# Patient Record
Sex: Male | Born: 1975 | Race: White | Hispanic: No | Marital: Single | Smoking: Current every day smoker
Health system: Southern US, Community
[De-identification: ages and names within clinical notes are randomized; demographics above are authoritative.]

## PROBLEM LIST (undated history)

## (undated) DIAGNOSIS — I2510 Atherosclerotic heart disease of native coronary artery without angina pectoris: Secondary | ICD-10-CM

## (undated) DIAGNOSIS — J449 Chronic obstructive pulmonary disease, unspecified: Secondary | ICD-10-CM

## (undated) DIAGNOSIS — Z5189 Encounter for other specified aftercare: Secondary | ICD-10-CM

## (undated) DIAGNOSIS — I7121 Aneurysm of the ascending aorta, without rupture: Secondary | ICD-10-CM

## (undated) DIAGNOSIS — I639 Cerebral infarction, unspecified: Secondary | ICD-10-CM

## (undated) DIAGNOSIS — I219 Acute myocardial infarction, unspecified: Secondary | ICD-10-CM

## (undated) DIAGNOSIS — I10 Essential (primary) hypertension: Secondary | ICD-10-CM

## (undated) DIAGNOSIS — E785 Hyperlipidemia, unspecified: Secondary | ICD-10-CM

## (undated) HISTORY — DX: Atherosclerotic heart disease of native coronary artery without angina pectoris: I25.10

## (undated) HISTORY — DX: Chronic obstructive pulmonary disease, unspecified: J44.9

## (undated) HISTORY — DX: Aneurysm of the ascending aorta, without rupture: I71.21

## (undated) HISTORY — DX: Hyperlipidemia, unspecified: E78.5

---

## 1998-10-08 ENCOUNTER — Ambulatory Visit (HOSPITAL_COMMUNITY): Admission: RE | Admit: 1998-10-08 | Discharge: 1998-10-08 | Payer: Self-pay | Admitting: *Deleted

## 2001-09-19 ENCOUNTER — Ambulatory Visit (HOSPITAL_COMMUNITY): Admission: RE | Admit: 2001-09-19 | Discharge: 2001-09-19 | Payer: Self-pay | Admitting: Internal Medicine

## 2018-04-01 ENCOUNTER — Encounter (HOSPITAL_COMMUNITY): Payer: Self-pay | Admitting: Emergency Medicine

## 2018-04-01 ENCOUNTER — Other Ambulatory Visit: Payer: Self-pay

## 2018-04-01 ENCOUNTER — Emergency Department (HOSPITAL_COMMUNITY)
Admission: EM | Admit: 2018-04-01 | Discharge: 2018-04-01 | Disposition: A | Discharge status: Home / Self Care | Payer: Self-pay | Attending: Emergency Medicine | Admitting: Emergency Medicine

## 2018-04-01 ENCOUNTER — Emergency Department (HOSPITAL_COMMUNITY): Payer: Self-pay

## 2018-04-01 DIAGNOSIS — I10 Essential (primary) hypertension: Secondary | ICD-10-CM | POA: Insufficient documentation

## 2018-04-01 DIAGNOSIS — R0789 Other chest pain: Secondary | ICD-10-CM | POA: Insufficient documentation

## 2018-04-01 DIAGNOSIS — F17210 Nicotine dependence, cigarettes, uncomplicated: Secondary | ICD-10-CM | POA: Insufficient documentation

## 2018-04-01 HISTORY — DX: Essential (primary) hypertension: I10

## 2018-04-01 HISTORY — DX: Encounter for other specified aftercare: Z51.89

## 2018-04-01 LAB — TROPONIN I: Troponin I: <0.03 ng/mL (ref <0.03)

## 2018-04-01 LAB — COMPREHENSIVE METABOLIC PANEL
ALT: 25 U/L (ref 17–63)
AST: 28 U/L (ref 15–41)
Albumin: 4.5 g/dL (ref 3.5–5.0)
Alkaline Phosphatase: 53 U/L (ref 38–126)
Anion gap: 11 (ref 5–15)
BILIRUBIN TOTAL: 0.6 mg/dL (ref 0.3–1.2)
BUN: 16 mg/dL (ref 6–20)
CHLORIDE: 105 mmol/L (ref 101–111)
CO2: 25 mmol/L (ref 22–32)
Calcium: 9.3 mg/dL (ref 8.9–10.3)
Creatinine, Ser: 0.84 mg/dL (ref 0.61–1.24)
GFR calc Af Amer: >60 mL/min (ref >60)
GFR calc non Af Amer: >60 mL/min (ref >60)
GLUCOSE: 105 mg/dL — AB (ref 65–99)
POTASSIUM: 3.8 mmol/L (ref 3.5–5.1)
Sodium: 141 mmol/L (ref 135–145)
Total Protein: 7.8 g/dL (ref 6.5–8.1)

## 2018-04-01 LAB — CBC
HEMATOCRIT: 48.9 % (ref 39.0–52.0)
Hemoglobin: 15.7 g/dL (ref 13.0–17.0)
MCH: 31.2 pg (ref 26.0–34.0)
MCHC: 32.1 g/dL (ref 30.0–36.0)
MCV: 97.2 fL (ref 78.0–100.0)
Platelets: 179 10*3/uL (ref 150–400)
RBC: 5.03 MIL/uL (ref 4.22–5.81)
RDW: 12.8 % (ref 11.5–15.5)
WBC: 8.2 10*3/uL (ref 4.0–10.5)

## 2018-04-01 LAB — RAPID URINE DRUG SCREEN, HOSP PERFORMED
AMPHETAMINES: NOT DETECTED
BARBITURATES: NOT DETECTED
BENZODIAZEPINES: NOT DETECTED
Cocaine: NOT DETECTED
Opiates: NOT DETECTED
Tetrahydrocannabinol: POSITIVE — AB

## 2018-04-01 LAB — LIPASE, BLOOD: LIPASE: 32 U/L (ref 11–51)

## 2018-04-01 MED ORDER — AMLODIPINE BESYLATE 5 MG PO TABS
10.0000 mg | ORAL_TABLET | Freq: Once | ORAL | Status: AC
Start: 2018-04-01 — End: 2018-04-01
  Administered 2018-04-01: 10 mg via ORAL
  Filled 2018-04-01: qty 2

## 2018-04-01 MED ORDER — LABETALOL HCL 5 MG/ML IV SOLN
20.0000 mg | Freq: Once | INTRAVENOUS | Status: AC
  Administered 2018-04-01: 20 mg via INTRAVENOUS
  Filled 2018-04-01: qty 4

## 2018-04-01 MED ORDER — TRAMADOL HCL 50 MG PO TABS: 50.0000 mg | ORAL_TABLET | Freq: Four times a day (QID) | ORAL | 0 refills | Status: DC | PRN

## 2018-04-01 MED ORDER — ONDANSETRON HCL 4 MG/2ML IJ SOLN
4.0000 mg | Freq: Once | INTRAMUSCULAR | Status: AC
  Administered 2018-04-01: 4 mg via INTRAVENOUS
  Filled 2018-04-01: qty 2

## 2018-04-01 MED ORDER — MORPHINE SULFATE (PF) 4 MG/ML IV SOLN
4.0000 mg | Freq: Once | INTRAVENOUS | Status: AC
  Administered 2018-04-01: 4 mg via INTRAVENOUS
  Filled 2018-04-01: qty 1

## 2018-04-01 MED ORDER — AMLODIPINE BESYLATE 10 MG PO TABS: 10.0000 mg | ORAL_TABLET | Freq: Every day | ORAL | 0 refills | Status: DC

## 2018-04-01 MED ORDER — ACETAMINOPHEN 500 MG PO TABS
1000.0000 mg | ORAL_TABLET | Freq: Once | ORAL | Status: AC
  Administered 2018-04-01: 1000 mg via ORAL
  Filled 2018-04-01: qty 2

## 2018-04-01 MED ORDER — SODIUM CHLORIDE 0.9 % IV SOLN
INTRAVENOUS | Status: DC
  Administered 2018-04-01: 10:00:00 via INTRAVENOUS

## 2018-04-01 NOTE — ED Provider Notes (Signed)
[facility] EMERGENCY DEPARTMENT Provider Note   CSN: [id number] Arrival date & time: 04/01/18  4098     History   Chief Complaint Chief Complaint  Patient presents with  . Chest Pain    HPI Caleb Perkins is a 42 y.o. male.  Patient c/o sharp pain to right lower chest area for the past 2 days. Symptoms sharp, non radiating, lasts seconds per episode per recur frequently with cough, change of position, or certain movements. Denies specific chest wall injury or strain, but states work does involve bending and lifting. Denies pleuritic pain. No sob. No nv or diaphoresis. Denies cough or sore throat. No fever or chills. No leg pain or swelling. Hx htn, but off meds x many months. No headache.   The history is provided by the patient.  Chest Pain   Pertinent negatives include no abdominal pain, no back pain, no cough, no fever, no headaches, no palpitations, no shortness of breath and no vomiting.    Past Medical History:  Diagnosis Date  . Blood transfusion without reported diagnosis   . Hypertension     There are no active problems to display for this patient.   History reviewed. No pertinent surgical history.      Home Medications    Prior to Admission medications   Not on File    Family History History reviewed. No pertinent family history.  Social History Social History   Tobacco Use  . Smoking status: Current Every Day Smoker    Packs/day: 1.00    Types: Cigarettes  Substance Use Topics  . Alcohol use: Not Currently  . Drug use: Yes    Types: Marijuana     Allergies   Patient has no known allergies.   Review of Systems Review of Systems  Constitutional: Negative for fever.  HENT: Negative for sore throat.   Eyes: Negative for visual disturbance.  Respiratory: Negative for cough and shortness of breath.   Cardiovascular: Positive for chest pain. Negative for palpitations and leg swelling.  Gastrointestinal: Negative for abdominal pain and  vomiting.  Genitourinary: Negative for flank pain.  Musculoskeletal: Negative for back pain and neck pain.  Skin: Negative for rash.  Neurological: Negative for headaches.  Hematological: Does not bruise/bleed easily.  Psychiatric/Behavioral: Negative for confusion.     Physical Exam Updated Vital Signs BP (!) 198/128   Pulse 73   Temp (!) 97.5 F (36.4 C) (Oral)   Resp 14   Ht 1.778 m (5\' 10" )   Wt 81.6 kg (180 lb)   SpO2 100%   BMI 25.83 kg/m   Physical Exam  Constitutional: He appears well-developed and well-nourished. No distress.  HENT:  Mouth/Throat: Oropharynx is clear and moist.  Eyes: Conjunctivae are normal.  Neck: Neck supple. No tracheal deviation present.  Cardiovascular: Normal rate, regular rhythm, normal heart sounds and intact distal pulses. Exam reveals no gallop and no friction rub.  No murmur heard. Pulmonary/Chest: Effort normal and breath sounds normal. No accessory muscle usage. No respiratory distress. He exhibits tenderness.  Right chest wall tenderness, reproducing pts symptoms, and worse w movement on stretcher, and palpation chest wall.   Abdominal: Soft. Bowel sounds are normal. He exhibits no distension and no mass. There is no tenderness. There is no guarding.  Genitourinary:  Genitourinary Comments: No cva tenderness  Musculoskeletal: He exhibits no edema.  Neurological: He is alert.  Skin: Skin is warm and dry. No rash noted. He is not diaphoretic.  Psychiatric: He has a  normal mood and affect.  Nursing note and vitals reviewed.    ED Treatments / Results  Labs (all labs ordered are listed, but only abnormal results are displayed) Results for orders placed or performed during the hospital encounter of 04/01/18  CBC  Result Value Ref Range   WBC 8.2 4.0 - 10.5 K/uL   RBC 5.03 4.22 - 5.81 MIL/uL   Hemoglobin 15.7 13.0 - 17.0 g/dL   HCT 16.148.9 09.639.0 - 04.552.0 %   MCV 97.2 78.0 - 100.0 fL   MCH 31.2 26.0 - 34.0 pg   MCHC 32.1 30.0 - 36.0  g/dL   RDW 40.912.8 81.111.5 - 91.415.5 %   Platelets 179 150 - 400 K/uL  Comprehensive metabolic panel  Result Value Ref Range   Sodium 141 135 - 145 mmol/L   Potassium 3.8 3.5 - 5.1 mmol/L   Chloride 105 101 - 111 mmol/L   CO2 25 22 - 32 mmol/L   Glucose, Bld 105 (H) 65 - 99 mg/dL   BUN 16 6 - 20 mg/dL   Creatinine, Ser 7.820.84 0.61 - 1.24 mg/dL   Calcium 9.3 8.9 - 95.610.3 mg/dL   Total Protein 7.8 6.5 - 8.1 g/dL   Albumin 4.5 3.5 - 5.0 g/dL   AST 28 15 - 41 U/L   ALT 25 17 - 63 U/L   Alkaline Phosphatase 53 38 - 126 U/L   Total Bilirubin 0.6 0.3 - 1.2 mg/dL   GFR calc non Af Amer >60 >60 mL/min   GFR calc Af Amer >60 >60 mL/min   Anion gap 11 5 - 15  Troponin I  Result Value Ref Range   Troponin I <0.03 <0.03 ng/mL  Rapid urine drug screen (hospital performed)  Result Value Ref Range   Opiates NONE DETECTED NONE DETECTED   Cocaine NONE DETECTED NONE DETECTED   Benzodiazepines NONE DETECTED NONE DETECTED   Amphetamines NONE DETECTED NONE DETECTED   Tetrahydrocannabinol POSITIVE (A) NONE DETECTED   Barbiturates NONE DETECTED NONE DETECTED  Lipase, blood  Result Value Ref Range   Lipase 32 11 - 51 U/L   Dg Chest Port 1 View  Result Date: 04/01/2018 CLINICAL DATA:  Chest pain worsening for 2 days. EXAM: PORTABLE CHEST 1 VIEW COMPARISON:  None. FINDINGS: The heart is enlarged. There is mild vascular congestion without frank edema or consolidation. No effusion or pneumothorax. IMPRESSION: Cardiomegaly with mild vascular congestion. Electronically Signed   By: Elsie StainJohn T Curnes M.D.   On: 04/01/2018 09:28    EKG EKG Interpretation  Date/Time:  Tuesday April 01 2018 08:52:23 EDT Ventricular Rate:  85 PR Interval:    QRS Duration: 94 QT Interval:  375 QTC Calculation: 446 R Axis:   71 Text Interpretation:  Sinus rhythm LVH with secondary repolarization abnormality Non-specific ST-t changes No previous tracing Confirmed by Cathren LaineSteinl, Jatziri Goffredo ([id number]) on 04/01/2018 9:07:57 AM   Radiology Dg Chest  Port 1 View  Result Date: 04/01/2018 CLINICAL DATA:  Chest pain worsening for 2 days. EXAM: PORTABLE CHEST 1 VIEW COMPARISON:  None. FINDINGS: The heart is enlarged. There is mild vascular congestion without frank edema or consolidation. No effusion or pneumothorax. IMPRESSION: Cardiomegaly with mild vascular congestion. Electronically Signed   By: Elsie StainJohn T Curnes M.D.   On: 04/01/2018 09:28    Procedures Procedures (including critical care time)  Medications Ordered in ED Medications  0.9 %  sodium chloride infusion ( Intravenous New Bag/Given 04/01/18 0931)  morphine 4 MG/ML injection 4 mg (4 mg Intravenous Given  04/01/18 0931)  ondansetron (ZOFRAN) injection 4 mg (4 mg Intravenous Given 04/01/18 0931)     Initial Impression / Assessment and Plan / ED Course  I have reviewed the triage vital signs and the nursing notes.  Pertinent labs & imaging results that were available during my care of the patient were reviewed by me and considered in my medical decision making (see chart for details).  Iv ns. Labs. Ecg. Cxr.  Morphine 4 mg iv for pain.   Recheck bp still high. Hx same.   Labetalol iv.   Recheck, pt comfortable/resting comfortably.  No increased wob or sob. Pt with reproducible right lower rib/chest wall tenderness. Normal chest wall movement.  Repeat abd exam - abd soft no tenderness.   Pt afebrile. bp improved from prior. Hr 68, rr 14. Pulse ox 99%.   Patient currently appears stable for d/c.      Final Clinical Impressions(s) / ED Diagnoses   Final diagnoses:  None    ED Discharge Orders    None       Cathren Laine, MD 04/01/18 1239

## 2018-04-01 NOTE — ED Triage Notes (Signed)
Pt reports right rib pain x 2 days.  Hurts worse to palpate, move, or cough.  States he has been coughing up white stuff x 2 days.

## 2018-04-01 NOTE — ED Notes (Signed)
Dr Denton Lanksteinl aware of patient

## 2018-04-01 NOTE — Discharge Instructions (Addendum)
It was our pleasure to provide your ER care today - we hope that you feel better.  Your blood pressure is high today. Take blood pressure medication (amlodipine) as prescribed, limit salt intake, and follow up with primary care doctor in the coming week.   Take motrin or aleve as need for pain. You may also take ultram as need for pain - no driving for the next 4 hours or when taking ultram.   Return to ER if worse, new symptoms, fevers, worsening/severe pain, trouble breathing, new or severe abdominal pain, other concern.

## 2018-04-01 NOTE — ED Notes (Signed)
Patient has been discharged at this time. Family with patient. Pt verbalizes readiness for discharge.

## 2019-11-09 ENCOUNTER — Inpatient Hospital Stay (HOSPITAL_COMMUNITY)
Admission: EM | Admit: 2019-11-09 | Discharge: 2019-11-11 | DRG: 192 | Disposition: A | Discharge status: Home / Self Care | Payer: Self-pay | Attending: Internal Medicine | Admitting: Internal Medicine

## 2019-11-09 ENCOUNTER — Encounter (HOSPITAL_COMMUNITY): Payer: Self-pay | Admitting: Emergency Medicine

## 2019-11-09 ENCOUNTER — Other Ambulatory Visit: Payer: Self-pay

## 2019-11-09 ENCOUNTER — Emergency Department (HOSPITAL_COMMUNITY): Payer: Self-pay

## 2019-11-09 DIAGNOSIS — I10 Essential (primary) hypertension: Secondary | ICD-10-CM | POA: Diagnosis present

## 2019-11-09 DIAGNOSIS — R05 Cough: Secondary | ICD-10-CM

## 2019-11-09 DIAGNOSIS — Z720 Tobacco use: Secondary | ICD-10-CM | POA: Diagnosis present

## 2019-11-09 DIAGNOSIS — F17210 Nicotine dependence, cigarettes, uncomplicated: Secondary | ICD-10-CM | POA: Diagnosis present

## 2019-11-09 DIAGNOSIS — J471 Bronchiectasis with (acute) exacerbation: Principal | ICD-10-CM | POA: Diagnosis present

## 2019-11-09 DIAGNOSIS — J441 Chronic obstructive pulmonary disease with (acute) exacerbation: Secondary | ICD-10-CM

## 2019-11-09 DIAGNOSIS — Z20828 Contact with and (suspected) exposure to other viral communicable diseases: Secondary | ICD-10-CM | POA: Diagnosis present

## 2019-11-09 DIAGNOSIS — J209 Acute bronchitis, unspecified: Secondary | ICD-10-CM | POA: Diagnosis present

## 2019-11-09 DIAGNOSIS — F1290 Cannabis use, unspecified, uncomplicated: Secondary | ICD-10-CM | POA: Diagnosis present

## 2019-11-09 DIAGNOSIS — J470 Bronchiectasis with acute lower respiratory infection: Secondary | ICD-10-CM | POA: Diagnosis present

## 2019-11-09 DIAGNOSIS — E876 Hypokalemia: Secondary | ICD-10-CM | POA: Diagnosis present

## 2019-11-09 DIAGNOSIS — R059 Cough, unspecified: Secondary | ICD-10-CM

## 2019-11-09 LAB — COMPREHENSIVE METABOLIC PANEL
ALT: 17 U/L (ref 0–44)
AST: 21 U/L (ref 15–41)
Albumin: 4.4 g/dL (ref 3.5–5.0)
Alkaline Phosphatase: 76 U/L (ref 38–126)
Anion gap: 10 (ref 5–15)
BUN: 15 mg/dL (ref 6–20)
CO2: 23 mmol/L (ref 22–32)
Calcium: 9 mg/dL (ref 8.9–10.3)
Chloride: 104 mmol/L (ref 98–111)
Creatinine, Ser: 0.89 mg/dL (ref 0.61–1.24)
GFR calc Af Amer: >60 mL/min (ref >60)
GFR calc non Af Amer: >60 mL/min (ref >60)
Glucose, Bld: 107 mg/dL — ABNORMAL HIGH (ref 70–99)
Potassium: 3.1 mmol/L — ABNORMAL LOW (ref 3.5–5.1)
Sodium: 137 mmol/L (ref 135–145)
Total Bilirubin: 0.8 mg/dL (ref 0.3–1.2)
Total Protein: 7.7 g/dL (ref 6.5–8.1)

## 2019-11-09 LAB — CBC WITH DIFFERENTIAL/PLATELET
Abs Immature Granulocytes: 0.02 10*3/uL (ref 0.00–0.07)
Basophils Absolute: 0.1 10*3/uL (ref 0.0–0.1)
Basophils Relative: 1 %
Eosinophils Absolute: 0.1 10*3/uL (ref 0.0–0.5)
Eosinophils Relative: 1 %
HCT: 48.3 % (ref 39.0–52.0)
Hemoglobin: 16.1 g/dL (ref 13.0–17.0)
Immature Granulocytes: 0 %
Lymphocytes Relative: 20 %
Lymphs Abs: 1.4 10*3/uL (ref 0.7–4.0)
MCH: 32.5 pg (ref 26.0–34.0)
MCHC: 33.3 g/dL (ref 30.0–36.0)
MCV: 97.6 fL (ref 80.0–100.0)
Monocytes Absolute: 0.5 10*3/uL (ref 0.1–1.0)
Monocytes Relative: 8 %
Neutro Abs: 4.8 10*3/uL (ref 1.7–7.7)
Neutrophils Relative %: 70 %
Platelets: 165 10*3/uL (ref 150–400)
RBC: 4.95 MIL/uL (ref 4.22–5.81)
RDW: 12.6 % (ref 11.5–15.5)
WBC: 6.8 10*3/uL (ref 4.0–10.5)
nRBC: 0 % (ref 0.0–0.2)

## 2019-11-09 LAB — RAPID URINE DRUG SCREEN, HOSP PERFORMED
Amphetamines: NOT DETECTED
Barbiturates: NOT DETECTED
Benzodiazepines: NOT DETECTED
Cocaine: NOT DETECTED
Opiates: NOT DETECTED
Tetrahydrocannabinol: POSITIVE — AB

## 2019-11-09 LAB — MAGNESIUM: Magnesium: 2.2 mg/dL (ref 1.7–2.4)

## 2019-11-09 MED ORDER — ALBUTEROL SULFATE HFA 108 (90 BASE) MCG/ACT IN AERS
6.0000 | INHALATION_SPRAY | Freq: Four times a day (QID) | RESPIRATORY_TRACT | Status: DC
  Administered 2019-11-10 (×3): 6 via RESPIRATORY_TRACT
  Filled 2019-11-09: qty 6.7

## 2019-11-09 MED ORDER — ALBUTEROL SULFATE HFA 108 (90 BASE) MCG/ACT IN AERS
4.0000 | INHALATION_SPRAY | Freq: Once | RESPIRATORY_TRACT | Status: AC
  Administered 2019-11-09: 13:00:00 4 via RESPIRATORY_TRACT
  Filled 2019-11-09: qty 6.7

## 2019-11-09 MED ORDER — GUAIFENESIN-DM 100-10 MG/5ML PO SYRP
10.0000 mL | ORAL_SOLUTION | Freq: Three times a day (TID) | ORAL | Status: AC
  Administered 2019-11-09 – 2019-11-11 (×6): 10 mL via ORAL
  Filled 2019-11-09 (×6): qty 10

## 2019-11-09 MED ORDER — ALBUTEROL SULFATE HFA 108 (90 BASE) MCG/ACT IN AERS
6.0000 | INHALATION_SPRAY | Freq: Once | RESPIRATORY_TRACT | Status: AC
  Administered 2019-11-09: 17:00:00 6 via RESPIRATORY_TRACT

## 2019-11-09 MED ORDER — SODIUM CHLORIDE 0.9 % IV SOLN
1.0000 g | Freq: Once | INTRAVENOUS | Status: AC
  Administered 2019-11-09: 19:00:00 1 g via INTRAVENOUS
  Filled 2019-11-09: qty 10

## 2019-11-09 MED ORDER — LABETALOL HCL 5 MG/ML IV SOLN
10.0000 mg | INTRAVENOUS | Status: DC | PRN
  Administered 2019-11-10 (×2): 10 mg via INTRAVENOUS
  Filled 2019-11-09 (×3): qty 4

## 2019-11-09 MED ORDER — POTASSIUM CHLORIDE CRYS ER 20 MEQ PO TBCR
40.0000 meq | EXTENDED_RELEASE_TABLET | ORAL | Status: AC
  Administered 2019-11-09 (×2): 40 meq via ORAL
  Filled 2019-11-09 (×2): qty 2

## 2019-11-09 MED ORDER — MAGNESIUM SULFATE 2 GM/50ML IV SOLN
2.0000 g | Freq: Once | INTRAVENOUS | Status: AC
  Administered 2019-11-09: 2 g via INTRAVENOUS
  Filled 2019-11-09: qty 50

## 2019-11-09 MED ORDER — ONDANSETRON HCL 4 MG/2ML IJ SOLN: 4.0000 mg | Freq: Four times a day (QID) | INTRAMUSCULAR | Status: DC | PRN

## 2019-11-09 MED ORDER — METHYLPREDNISOLONE SODIUM SUCC 125 MG IJ SOLR
125.0000 mg | Freq: Once | INTRAMUSCULAR | Status: AC
  Administered 2019-11-09: 125 mg via INTRAVENOUS
  Filled 2019-11-09: qty 2

## 2019-11-09 MED ORDER — ONDANSETRON HCL 4 MG PO TABS: 4.0000 mg | ORAL_TABLET | Freq: Four times a day (QID) | ORAL | Status: DC | PRN

## 2019-11-09 MED ORDER — ACETAMINOPHEN 650 MG RE SUPP: 650.0000 mg | Freq: Four times a day (QID) | RECTAL | Status: DC | PRN

## 2019-11-09 MED ORDER — AEROCHAMBER PLUS FLO-VU MEDIUM MISC
1.0000 | Freq: Once | Status: AC
  Administered 2019-11-09: 1
  Filled 2019-11-09: qty 1

## 2019-11-09 MED ORDER — IPRATROPIUM-ALBUTEROL 20-100 MCG/ACT IN AERS
1.0000 | INHALATION_SPRAY | Freq: Four times a day (QID) | RESPIRATORY_TRACT | Status: DC | PRN
  Administered 2019-11-09: 17:00:00 1 via RESPIRATORY_TRACT
  Filled 2019-11-09: qty 4

## 2019-11-09 MED ORDER — ALBUTEROL SULFATE HFA 108 (90 BASE) MCG/ACT IN AERS: 6.0000 | INHALATION_SPRAY | Freq: Once | RESPIRATORY_TRACT | Status: DC

## 2019-11-09 MED ORDER — ACETAMINOPHEN 325 MG PO TABS: 650.0000 mg | ORAL_TABLET | Freq: Four times a day (QID) | ORAL | Status: DC | PRN

## 2019-11-09 MED ORDER — METHYLPREDNISOLONE SODIUM SUCC 125 MG IJ SOLR
60.0000 mg | Freq: Two times a day (BID) | INTRAMUSCULAR | Status: DC
  Administered 2019-11-10: 09:00:00 60 mg via INTRAVENOUS
  Filled 2019-11-09: qty 2

## 2019-11-09 MED ORDER — AMLODIPINE BESYLATE 5 MG PO TABS
5.0000 mg | ORAL_TABLET | Freq: Every day | ORAL | Status: DC
  Administered 2019-11-09 – 2019-11-10 (×2): 5 mg via ORAL
  Filled 2019-11-09 (×2): qty 1

## 2019-11-09 MED ORDER — IPRATROPIUM BROMIDE HFA 17 MCG/ACT IN AERS: 2.0000 | INHALATION_SPRAY | Freq: Once | RESPIRATORY_TRACT | Status: DC

## 2019-11-09 MED ORDER — POLYETHYLENE GLYCOL 3350 17 G PO PACK: 17.0000 g | PACK | Freq: Every day | ORAL | Status: DC | PRN

## 2019-11-09 MED ORDER — DOXYCYCLINE HYCLATE 100 MG PO TABS
100.0000 mg | ORAL_TABLET | Freq: Once | ORAL | Status: AC
  Administered 2019-11-09: 16:00:00 100 mg via ORAL
  Filled 2019-11-09: qty 1

## 2019-11-09 NOTE — H&P (Signed)
History and Physical    Caleb Perkins DOB: 08-23-1976 DOA: 11/09/2019  PCP: Patient, No Pcp Per   Patient coming from: Home  I have personally briefly reviewed patient's old medical records in [place]  Chief Complaint: SOB, Cough  HPI: Caleb Perkins is a 43 y.o. male with medical history significant for HTN, tobacco abuse.  Patient presented to the ED with complaints of difficulty breathing cough nasal and chest congestion of 3 days duration with wheezing. He is unaware of fever or chills.  Denies contacts with Covid positive patients or large gatherings.  Smokes half a pack of cigarettes daily.  ED Course: Systolic blood pressure 295J to 180s, O2 sats greater than 93% on room air.  Temperature 99.1.  Chest x-ray negative for acute abnormality.  WBC 6.8.  Potassium 3.1.  IV Solu-Medrol, magnesium, Combivent inhaler albuterol inhaler given without significant improvement in rhonchi/wheezing, on attempts at ambulation patient became tachypneic.  IV ceftriaxone and doxycycline also given.  COVID-19 test is pending.  Hospitalist called to admit for further evaluation and management.  Review of Systems: As per HPI all other systems reviewed and negative.  Past Medical History:  Diagnosis Date  . Blood transfusion without reported diagnosis   . Hypertension     History reviewed. No pertinent surgical history.   reports that he has been smoking cigarettes. He has been smoking about 0.50 packs per day. He has never used smokeless tobacco. He reports previous alcohol use. He reports current drug use. Drug: Marijuana.  No Known Allergies  Denies history of hypertension or diabetes in father or mother.   Prior to Admission medications   Not on File    Physical Exam: Vitals:   11/09/19 1700 11/09/19 1725 11/09/19 1730 11/09/19 1830  BP: (!) 178/117  (!) 189/105 (!) 171/104  Pulse: 80  78 79  Resp: (!) 21  17 (!) 23  Temp:      SpO2: 95% 97% 97% 93%  Weight:       Height:        Constitutional: NAD, calm, comfortable Vitals:   11/09/19 1700 11/09/19 1725 11/09/19 1730 11/09/19 1830  BP: (!) 178/117  (!) 189/105 (!) 171/104  Pulse: 80  78 79  Resp: (!) 21  17 (!) 23  Temp:      SpO2: 95% 97% 97% 93%  Weight:      Height:       Eyes: PERRL, lids and conjunctivae normal ENMT: Mucous membranes are moist. Posterior pharynx clear of any exudate or lesions.  Neck: normal, supple, no masses, no thyromegaly Respiratory: Diffuse mostly inspiratory and less expiratory rhonchi, no crackles. Normal respiratory effort. No accessory muscle use.  Cardiovascular: Regular rate and rhythm, no murmurs / rubs / gallops. No extremity edema. 2+ pedal pulses. No carotid bruits.  Abdomen: no tenderness, no masses palpated. No hepatosplenomegaly. Bowel sounds positive.  Musculoskeletal: no clubbing / cyanosis. No joint deformity upper and lower extremities. Good ROM, no contractures. Normal muscle tone.  Skin: no rashes, lesions, ulcers. No induration Neurologic: CN 2-12 grossly intact.  Strength 5/5 in all 4.  Psychiatric: Normal judgment and insight. Alert and oriented x 3. Normal mood.   Labs on Admission: I have personally reviewed following labs and imaging studies  CBC: Recent Labs  Lab 11/09/19 1450  WBC 6.8  NEUTROABS 4.8  HGB 16.1  HCT 48.3  MCV 97.6  PLT 884   Basic Metabolic Panel: Recent Labs  Lab 11/09/19 1450  NA 137  K 3.1*  CL 104  CO2 23  GLUCOSE 107*  BUN 15  CREATININE 0.89  CALCIUM 9.0  MG 2.2   Liver Function Tests: Recent Labs  Lab 11/09/19 1450  AST 21  ALT 17  ALKPHOS 76  BILITOT 0.8  PROT 7.7  ALBUMIN 4.4    Radiological Exams on Admission: Dg Chest Port 1 View  Result Date: 11/09/2019 CLINICAL DATA:  Cough for 4 days. EXAM: PORTABLE CHEST 1 VIEW COMPARISON:  None. FINDINGS: Lungs are clear. Heart size is normal. No pneumothorax or pleural fluid. No acute or focal bony abnormality. IMPRESSION: Negative  chest. Electronically Signed   By: Drusilla Kanner M.D.   On: 11/09/2019 12:38    EKG: Independently reviewed. QTC 492.  Sinus rhythm with LVH and repolarization abnormalities slightly more pronounced than prior EKG.  Assessment/Plan Active Problems:   Acute bronchitis  Acute bronchitis-dyspnea, cough, persistent rhonchi/wheezing, low-grade fever of 99.1 smokes half pack cigarettes daily.  WBC 6.8.  Chest x-ray without acute abnormality.  No known Covid positive contacts. -Follow-up COVID-19 test, PUI -Hold off on further antibiotics at this time, as no infiltrates on chest x-ray -Scheduled and as needed Combivent/albuterol inhaler -Continue IV Solu-Medrol 60 every 12 hourly -Scheduled mucolytics -BMP, CBC a.m.  Hypokalemia 3.1 -Replete -Check magnesium-2.2  Uncontrolled hypertension-systolic 160s to 740C.  EKG suggests chronic uncontrolled hypertension.  He is not sure of the names of his antihypertensives- maybe "Altace and another medication that maybe starts with M ". ?? Metoprolol. Reports he was taking it for a while. Unable to give more details. -IV as needed labetalol - Will start Norvasc 5mg  daily.   DVT prophylaxis: SCDs Code Status: Full code Family Communication: None at bedside Disposition Plan: 1 to 2 days Consults called: None Admission status: Observation, MedSurg  MD [facility]  11/09/2019, 9:35 PM

## 2019-11-09 NOTE — ED Notes (Signed)
States he did not take his blood pressure meds this am.

## 2019-11-09 NOTE — ED Triage Notes (Signed)
Pt states that he has had a cough since Friday he has congestion in his chest and a runny nose. He is coughing up yellow and white stuff.

## 2019-11-09 NOTE — ED Provider Notes (Signed)
[facility] EMERGENCY DEPARTMENT Provider Note   CSN: [id number] Arrival date & time: 11/09/19  0840     History   Chief Complaint Chief Complaint  Patient presents with  . Cough  . Nasal Congestion    HPI Caleb Perkins is a 43 y.o. male with past medical history of hypertension, 30-pack-year smoking history, who presents today for evaluation of cough.  He reports that 4 days ago he developed nasal congestion that went away and he now reports chest congestion.  He reports that he has had a new productive cough.  He does not have a cough at baseline.  He denies any fevers.  He reports that he can feel his chest rattle when he breathes.  He has tried nyquil with mild relief.       HPI  Past Medical History:  Diagnosis Date  . Blood transfusion without reported diagnosis   . Hypertension     Patient Active Problem List   Diagnosis Date Noted  . Acute bronchitis 11/09/2019    History reviewed. No pertinent surgical history.      Home Medications    Prior to Admission medications   Not on File    Family History No family history on file.  Social History Social History   Tobacco Use  . Smoking status: Current Every Day Smoker    Packs/day: 0.50    Types: Cigarettes  . Smokeless tobacco: Never Used  Substance Use Topics  . Alcohol use: Not Currently  . Drug use: Yes    Types: Marijuana     Allergies   Patient has no known allergies.   Review of Systems Review of Systems  Constitutional: Positive for fatigue. Negative for chills and fever.  HENT: Positive for congestion and sinus pressure. Negative for sore throat.   Respiratory: Positive for cough. Negative for chest tightness and shortness of breath.   Cardiovascular: Negative for chest pain.  Gastrointestinal: Negative for abdominal pain, nausea and vomiting.  Musculoskeletal: Negative for back pain.  Skin: Negative for color change and rash.  Neurological: Negative for weakness and headaches.   All other systems reviewed and are negative.    Physical Exam Updated Vital Signs BP (!) 162/102   Pulse 76   Temp 99.1 F (37.3 C)   Resp 14   Ht 5\' 10"  (1.778 m)   Wt 77.6 kg   SpO2 93%   BMI 24.54 kg/m   Physical Exam Vitals signs and nursing note reviewed.  Constitutional:      General: He is not in acute distress.    Appearance: He is well-developed.  HENT:     Head: Normocephalic and atraumatic.  Eyes:     Conjunctiva/sclera: Conjunctivae normal.  Neck:     Musculoskeletal: Normal range of motion and neck supple. No neck rigidity.  Cardiovascular:     Rate and Rhythm: Normal rate and regular rhythm.     Heart sounds: No murmur.  Pulmonary:     Effort: Pulmonary effort is normal.     Breath sounds: Rhonchi (Diffuse, bilaterally, able to hear with out stethoscope) present.  Abdominal:     General: Abdomen is flat. There is no distension.     Palpations: Abdomen is soft.     Tenderness: There is no abdominal tenderness.  Musculoskeletal:     Right lower leg: No edema.     Left lower leg: No edema.  Skin:    General: Skin is warm and dry.  Neurological:  General: No focal deficit present.     Mental Status: He is alert.     Cranial Nerves: No cranial nerve deficit.  Psychiatric:        Mood and Affect: Mood normal.      ED Treatments / Results  Labs (all labs ordered are listed, but only abnormal results are displayed) Labs Reviewed  COMPREHENSIVE METABOLIC PANEL - Abnormal; Notable for the following components:      Result Value   Potassium 3.1 (*)    Glucose, Bld 107 (*)    All other components within normal limits  SARS CORONAVIRUS 2 (TAT 6-24 HRS)  CBC WITH DIFFERENTIAL/PLATELET  MAGNESIUM  RAPID URINE DRUG SCREEN, HOSP PERFORMED    EKG EKG Interpretation  Date/Time:  Monday November 09 2019 18:00:11 EST Ventricular Rate:  86 PR Interval:    QRS Duration: 99 QT Interval:  411 QTC Calculation: 492 R Axis:   66 Text Interpretation:  Sinus rhythm LAE, consider biatrial enlargement LVH with secondary repolarization abnormality ST depression, consider ischemia, diffuse lds Borderline prolonged QT interval since 04/01/18, no changes seen similar EKG Confirmed by Noemi Chapel [phone]) on 11/09/2019 6:03:33 PM   Radiology Dg Chest Port 1 View  Result Date: 11/09/2019 CLINICAL DATA:  Cough for 4 days. EXAM: PORTABLE CHEST 1 VIEW COMPARISON:  None. FINDINGS: Lungs are clear. Heart size is normal. No pneumothorax or pleural fluid. No acute or focal bony abnormality. IMPRESSION: Negative chest. Electronically Signed   By: Inge Rise M.D.   On: 11/09/2019 12:38    Procedures .Critical Care Performed by: Lorin Glass, PA-C Authorized by: Lorin Glass, PA-C   Critical care provider statement:    Critical care time (minutes):  45   Critical care was time spent personally by me on the following activities:  Discussions with consultants, evaluation of patient's response to treatment, examination of patient, ordering and performing treatments and interventions, ordering and review of laboratory studies, ordering and review of radiographic studies, pulse oximetry, re-evaluation of patient's condition, obtaining history from patient or surrogate and review of old charts   (including critical care time)  Medications Ordered in ED Medications  Ipratropium-Albuterol (COMBIVENT) respimat 1 puff (1 puff Inhalation Given 11/09/19 1725)  potassium chloride SA (KLOR-CON) CR tablet 40 mEq (40 mEq Oral Given 11/09/19 1917)  labetalol (NORMODYNE) injection 10 mg (has no administration in time range)  albuterol (VENTOLIN HFA) 108 (90 Base) MCG/ACT inhaler 4 puff (4 puffs Inhalation Given 11/09/19 1328)  AeroChamber Plus Flo-Vu Medium MISC 1 each (1 each Other Given 11/09/19 1328)  magnesium sulfate IVPB 2 g 50 mL (0 g Intravenous Stopped 11/09/19 1730)  methylPREDNISolone sodium succinate (SOLU-MEDROL) 125 mg/2 mL injection 125 mg  (125 mg Intravenous Given 11/09/19 1614)  albuterol (VENTOLIN HFA) 108 (90 Base) MCG/ACT inhaler 6 puff (6 puffs Inhalation Given 11/09/19 1727)  doxycycline (VIBRA-TABS) tablet 100 mg (100 mg Oral Given 11/09/19 1613)  cefTRIAXone (ROCEPHIN) 1 g in sodium chloride 0.9 % 100 mL IVPB (0 g Intravenous Stopped 11/09/19 2019)     Initial Impression / Assessment and Plan / ED Course  I have reviewed the triage vital signs and the nursing notes.  Pertinent labs & imaging results that were available during my care of the patient were reviewed by me and considered in my medical decision making (see chart for details).  Clinical Course as of Nov 08 2130  [facility] Nov 09, 2019  1407 Patient reevaluated, he continues to have significant wheezing and shortness  of breath.  He does feel slightly better however still feels short of breath.   [EH]  1600 Suspect secondary to albuterol  Potassium(!): 3.1 [EH]  1808 Reevaluated patient, his lung sounds are no different after getting additional doses of albuterol, Combivent, IV steroids, IV magnesium.  He continues to wheeze.  When he got up to ambulate in the room he became tachypneic into the 30s.    [EH]  1839 Spoke with hospitalist who will come see patient.    [EH]    Clinical Course User Index [EH] Cristina Gong, PA-C      Patient presents today for evaluation of new productive cough.  On exam he has diffuse wheezes and rhonchi bilaterally.  He has a 30-pack-year smoking history and has not been previously diagnosed with asthma.  Chest x-ray obtained without evidence of consolidation or other abnormalities.  I suspect based on his smoking history that he has undiagnosed COPD.  He is treated with albuterol after which he continues to wheeze with no significant change in his lung sounds.  He was then treated with additional albuterol along with Combivent inhaler, IV Solu-Medrol, p.o. doxycycline, IV Rocephin, and IV magnesium sulfate after which she  continued to have a significant wheezes and rhonchi present.  Labs are obtained and reviewed, he has mild hypokalemia which I suspect is secondary to albuterol.  He is hypertensive here.  EKG obtained without clear evidence of ischemia.  Chest x-ray does not show evidence of pulmonary vascular congestion or fluid overload therefore do not suspect pulmonary edema.  Coronavirus testing was sent.  After he got treatment I attempted to ambulate him in the room and while he maintained his oxygen sat he did become tachypneic into the 30s.  While in the ER his oxygen fluctuated as low as 93% on room air.  Recommended admission given failure to have significant improvement in lung sounds along with borderline hypoxia despite aggressive treatment.  I spoke with hospitalist who agreed to admit patient.  Patient made hemodynamically stable while in my care.  Final Clinical Impressions(s) / ED Diagnoses   Final diagnoses:  Acute exacerbation of chronic obstructive pulmonary disease (COPD) [facility])    ED Discharge Orders    None       Norman Clay 11/09/19 2131    Bethann Berkshire, MD 11/10/19 [phone]

## 2019-11-10 ENCOUNTER — Encounter (HOSPITAL_COMMUNITY): Payer: Self-pay | Admitting: *Deleted

## 2019-11-10 ENCOUNTER — Other Ambulatory Visit: Payer: Self-pay

## 2019-11-10 DIAGNOSIS — I10 Essential (primary) hypertension: Secondary | ICD-10-CM | POA: Diagnosis present

## 2019-11-10 DIAGNOSIS — J441 Chronic obstructive pulmonary disease with (acute) exacerbation: Secondary | ICD-10-CM

## 2019-11-10 DIAGNOSIS — Z720 Tobacco use: Secondary | ICD-10-CM | POA: Diagnosis present

## 2019-11-10 LAB — BASIC METABOLIC PANEL
Anion gap: 12 (ref 5–15)
BUN: 18 mg/dL (ref 6–20)
CO2: 20 mmol/L — ABNORMAL LOW (ref 22–32)
Calcium: 9.4 mg/dL (ref 8.9–10.3)
Chloride: 107 mmol/L (ref 98–111)
Creatinine, Ser: 0.93 mg/dL (ref 0.61–1.24)
GFR calc Af Amer: >60 mL/min (ref >60)
GFR calc non Af Amer: >60 mL/min (ref >60)
Glucose, Bld: 186 mg/dL — ABNORMAL HIGH (ref 70–99)
Potassium: 4 mmol/L (ref 3.5–5.1)
Sodium: 139 mmol/L (ref 135–145)

## 2019-11-10 LAB — HIV ANTIBODY (ROUTINE TESTING W REFLEX): HIV Screen 4th Generation wRfx: NONREACTIVE

## 2019-11-10 LAB — SARS CORONAVIRUS 2 (TAT 6-24 HRS): SARS Coronavirus 2: NEGATIVE

## 2019-11-10 MED ORDER — AMLODIPINE BESYLATE 5 MG PO TABS
10.0000 mg | ORAL_TABLET | Freq: Every day | ORAL | Status: DC
  Administered 2019-11-11: 08:00:00 10 mg via ORAL
  Filled 2019-11-10: qty 2

## 2019-11-10 MED ORDER — NICOTINE 14 MG/24HR TD PT24
14.0000 mg | MEDICATED_PATCH | Freq: Every day | TRANSDERMAL | Status: DC
  Administered 2019-11-10 – 2019-11-11 (×2): 14 mg via TRANSDERMAL
  Filled 2019-11-10 (×2): qty 1

## 2019-11-10 MED ORDER — GUAIFENESIN ER 600 MG PO TB12
600.0000 mg | ORAL_TABLET | Freq: Two times a day (BID) | ORAL | Status: DC
  Administered 2019-11-10: 600 mg via ORAL
  Filled 2019-11-10: qty 1

## 2019-11-10 MED ORDER — IPRATROPIUM-ALBUTEROL 0.5-2.5 (3) MG/3ML IN SOLN
3.0000 mL | Freq: Four times a day (QID) | RESPIRATORY_TRACT | Status: DC
  Administered 2019-11-11 (×2): 3 mL via RESPIRATORY_TRACT
  Filled 2019-11-10 (×2): qty 3

## 2019-11-10 MED ORDER — DOXYCYCLINE HYCLATE 100 MG PO TABS
100.0000 mg | ORAL_TABLET | Freq: Two times a day (BID) | ORAL | Status: DC
  Administered 2019-11-10 – 2019-11-11 (×2): 100 mg via ORAL
  Filled 2019-11-10 (×2): qty 1

## 2019-11-10 MED ORDER — METHYLPREDNISOLONE SODIUM SUCC 40 MG IJ SOLR: 40.0000 mg | Freq: Two times a day (BID) | INTRAMUSCULAR | Status: DC

## 2019-11-10 MED ORDER — ALBUTEROL SULFATE (2.5 MG/3ML) 0.083% IN NEBU: 2.5000 mg | INHALATION_SOLUTION | RESPIRATORY_TRACT | Status: DC | PRN

## 2019-11-10 MED ORDER — ENOXAPARIN SODIUM 40 MG/0.4ML ~~LOC~~ SOLN
40.0000 mg | Freq: Once | SUBCUTANEOUS | Status: AC
  Administered 2019-11-10: 23:00:00 40 mg via SUBCUTANEOUS
  Filled 2019-11-10: qty 0.4

## 2019-11-10 MED ORDER — METHYLPREDNISOLONE SODIUM SUCC 40 MG IJ SOLR
40.0000 mg | Freq: Two times a day (BID) | INTRAMUSCULAR | Status: DC
  Administered 2019-11-10 – 2019-11-11 (×2): 40 mg via INTRAVENOUS
  Filled 2019-11-10 (×2): qty 1

## 2019-11-10 MED ORDER — ALBUTEROL SULFATE (2.5 MG/3ML) 0.083% IN NEBU
2.5000 mg | INHALATION_SOLUTION | Freq: Four times a day (QID) | RESPIRATORY_TRACT | Status: DC
  Administered 2019-11-10: 20:00:00 2.5 mg via RESPIRATORY_TRACT
  Filled 2019-11-10: qty 3

## 2019-11-10 NOTE — Progress Notes (Signed)
CSW made aware that patient will be ready for discharge pending CSW consult. Pt reports that he currently is without PCP and insurance. CSW provided education regarding [facility] program. CSW facilitated phone call with care connect and provided patient with application to begin services. Pt is agreeable and thankful. Pt is instructed to complete [facility] application and schedule appointment upon turning it in.   Pt reports that he does not not need transportation home and is able to walk home as he stays near by.  [place] [facility]  Clinical Social Worker  Ph: [phone]

## 2019-11-10 NOTE — Plan of Care (Signed)

## 2019-11-10 NOTE — ED Notes (Signed)
Pt given meal tray.

## 2019-11-10 NOTE — Progress Notes (Signed)
Patient Demographics:    Caleb Perkins, is a 43 y.o. male, DOB - 1976-09-21, FAO:[id number]  Admit date - 11/09/2019   Admitting Physician Bethena Roys, MD  Outpatient Primary MD for the patient is Patient, No Pcp Per  LOS - 0   Chief Complaint  Patient presents with   Cough   Nasal Congestion        Subjective:    Caleb Perkins today has no fevers, no emesis,  No chest pain,   -Continues to have significant cough, shortness of breath, dyspnea on exertion, wheezing quite a bit especially with activity, headache and dizziness persist as well  Assessment  & Plan :    Active Problems:   Acute bronchitis   COPD with acute exacerbation [facility])  Brief Summary a 43 y.o. male with medical history significant for HTN, tobacco abuse admitted on 11/09/2019 with COPD exacerbation and persistent elevated BP   A/p 1)Acute COPD Exacerbation--- patient continues to be quite symptomatic with significant cough, shortness of breath at rest, significant wheezing, significant dyspnea on exertion--- cough worsens with attempts to ambulate -Give doxycycline and Solu-Medrol, Mucinex, bronchodilators,  2)Tobacco Abuse--smoking cessation advised, continue nicotine patch  3)HTN- stage II--systolic blood pressure over 200 mmhg from time to time diastolic blood pressure over 110 mmhg from time to time patient with headaches, dizziness and shortness of breath- Increase amlodipine to 10 mg daily, -IV labetalol as needed elevated BP  Disposition/Need for in-Hospital Stay- patient unable to be discharged at this time due to --- patient is still quite symptomatic from a respiratory standpoint in the setting of COPD exacerbation with dyspnea on exertion and significant wheezing also BP remains elevated with headaches dizziness and shortness of breath--  Code Status : Full  Family Communication:   NA (patient is alert,  awake and coherent)   Disposition Plan  : home   Consults  :  na  DVT Prophylaxis  :  Lovenox -   SCD  Lab Results  Component Value Date   PLT 165 11/09/2019    Inpatient Medications  Scheduled Meds:  amLODipine  10 mg Oral Daily   guaiFENesin  600 mg Oral BID   guaiFENesin-dextromethorphan  10 mL Oral Q8H   ipratropium-albuterol  3 mL Nebulization Q6H   [START ON 11/11/2019] methylPREDNISolone (SOLU-MEDROL) injection  40 mg Intravenous Q12H   nicotine  14 mg Transdermal Daily   Continuous Infusions: PRN Meds:.acetaminophen **OR** acetaminophen, albuterol, labetalol, ondansetron **OR** ondansetron (ZOFRAN) IV, polyethylene glycol    Anti-infectives (From admission, onward)   Start     Dose/Rate Route Frequency Ordered Stop   11/09/19 1815  cefTRIAXone (ROCEPHIN) 1 g in sodium chloride 0.9 % 100 mL IVPB     1 g 200 mL/hr over 30 Minutes Intravenous  Once 11/09/19 1810 11/09/19 2019   11/09/19 1530  doxycycline (VIBRA-TABS) tablet 100 mg     100 mg Oral  Once 11/09/19 1515 11/09/19 1613        Objective:   Vitals:   11/10/19 1430 11/10/19 1445 11/10/19 1502 11/10/19 1940  BP: (!) 177/104 (!) 184/114 (!) 177/110   Pulse: 100 89    Resp: 15 18    Temp:  98.2 F (36.8 C)    TempSrc:  Oral    SpO2: 96% 97%  93%  Weight:  77.6 kg    Height:  5\' 10"  (1.778 m)     Temp:  [98.2 F (36.8 C)] 98.2 F (36.8 C) (11/10 1445) Pulse Rate:  [69-116] 89 (11/10 1445) Resp:  [12-26] 18 (11/10 1445) BP: (148-208)/(96-116) 177/110 (11/10 1502) SpO2:  [90 %-100 %] 93 % (11/10 1940) Weight:  [77.6 kg] 77.6 kg (11/10 1445)   Wt Readings from Last 3 Encounters:  11/10/19 77.6 kg  04/01/18 81.6 kg    No intake or output data in the 24 hours ending 11/10/19 2106   Physical Exam  Gen:- Awake Alert, dyspnea on exertion but no conversational dyspnea at rest HEENT:- [place].AT, No sclera icterus Neck-Supple Neck,No JVD,.  Lungs-diminished breath sounds with scattered  wheezes bilaterally,  CV- S1, S2 normal, regular  Abd-  +ve B.Sounds, Abd Soft, No tenderness,    Extremity/Skin:- No  edema, pedal pulses present  Psych-affect is appropriate, oriented x3 Neuro-no new focal deficits, no tremors, has headaches   Data Review:   Micro Results Recent Results (from the past 240 hour(s))  SARS CORONAVIRUS 2 (TAT 6-24 HRS) Nasopharyngeal Nasopharyngeal Swab     Status: None   Collection Time: 11/09/19  1:20 PM   Specimen: Nasopharyngeal Swab  Result Value Ref Range Status   SARS Coronavirus 2 NEGATIVE NEGATIVE Final    Comment: (NOTE) SARS-CoV-2 target nucleic acids are NOT DETECTED. The SARS-CoV-2 RNA is generally detectable in upper and lower respiratory specimens during the acute phase of infection. Negative results do not preclude SARS-CoV-2 infection, do not rule out co-infections with other pathogens, and should not be used as the sole basis for treatment or other patient management decisions. Negative results must be combined with clinical observations, patient history, and epidemiological information. The expected result is Negative. Fact Sheet for Patients: 13/09/20 Fact Sheet for Healthcare Providers: HairSlick.no This test is not yet approved or cleared by the [place] FDA and  has been authorized for detection and/or diagnosis of SARS-CoV-2 by FDA under an Emergency Use Authorization (EUA). This EUA will remain  in effect (meaning this test can be used) for the duration of the COVID-19 declaration under Section 56 4(b)(1) of the Act, 21 U.S.C. section 360bbb-3(b)(1), unless the authorization is terminated or revoked sooner. Performed at [facility], [address] [place], [place]     Radiology Reports Dg Chest Port 1 View  Result Date: 11/09/2019 CLINICAL DATA:  Cough for 4 days. EXAM: PORTABLE CHEST 1 VIEW COMPARISON:  None. FINDINGS: Lungs are  clear. Heart size is normal. No pneumothorax or pleural fluid. No acute or focal bony abnormality. IMPRESSION: Negative chest. Electronically Signed   By: 13/09/2019 M.D.   On: 11/09/2019 12:38     CBC Recent Labs  Lab 11/09/19 1450  WBC 6.8  HGB 16.1  HCT 48.3  PLT 165  MCV 97.6  MCH 32.5  MCHC 33.3  RDW 12.6  LYMPHSABS 1.4  MONOABS 0.5  EOSABS 0.1  BASOSABS 0.1    Chemistries  Recent Labs  Lab 11/09/19 1450 11/10/19 0447  NA 137 139  K 3.1* 4.0  CL 104 107  CO2 23 20*  GLUCOSE 107* 186*  BUN 15 18  CREATININE 0.89 0.93  CALCIUM 9.0 9.4  MG 2.2  --   AST 21  --   ALT 17  --   ALKPHOS 76  --   BILITOT 0.8  --    ------------------------------------------------------------------------------------------------------------------  No results for input(s): CHOL, HDL, LDLCALC, TRIG, CHOLHDL, LDLDIRECT in the last 72 hours.  No results found for: HGBA1C ------------------------------------------------------------------------------------------------------------------ No results for input(s): TSH, T4TOTAL, T3FREE, THYROIDAB in the last 72 hours.  Invalid input(s): FREET3 ------------------------------------------------------------------------------------------------------------------ No results for input(s): VITAMINB12, FOLATE, FERRITIN, TIBC, IRON, RETICCTPCT in the last 72 hours.  Coagulation profile No results for input(s): INR, PROTIME in the last 168 hours.  No results for input(s): DDIMER in the last 72 hours.  Cardiac Enzymes No results for input(s): CKMB, TROPONINI, MYOGLOBIN in the last 168 hours.  Invalid input(s): CK ------------------------------------------------------------------------------------------------------------------ No results found for: BNP   Caleb Perkins M.D on 11/10/2019 at 9:06 PM  Go to [email or web address] - for contact info  [facility] - Office  [phone]

## 2019-11-11 DIAGNOSIS — I10 Essential (primary) hypertension: Secondary | ICD-10-CM

## 2019-11-11 DIAGNOSIS — Z720 Tobacco use: Secondary | ICD-10-CM

## 2019-11-11 MED ORDER — DOXYCYCLINE HYCLATE 100 MG PO TABS: 100.0000 mg | ORAL_TABLET | Freq: Two times a day (BID) | ORAL | 0 refills | Status: DC

## 2019-11-11 MED ORDER — SPIRONOLACTONE-HCTZ 25-25 MG PO TABS: 1.0000 | ORAL_TABLET | Freq: Every day | ORAL | Status: DC

## 2019-11-11 MED ORDER — ALBUTEROL SULFATE HFA 108 (90 BASE) MCG/ACT IN AERS: 2.0000 | INHALATION_SPRAY | Freq: Four times a day (QID) | RESPIRATORY_TRACT | 2 refills | Status: AC | PRN

## 2019-11-11 MED ORDER — PREDNISONE 20 MG PO TABS: ORAL_TABLET | ORAL | 0 refills | Status: DC

## 2019-11-11 MED ORDER — SPIRONOLACTONE-HCTZ 25-25 MG PO TABS: 1.0000 | ORAL_TABLET | Freq: Every day | ORAL | 1 refills | Status: DC

## 2019-11-11 MED ORDER — FLUTICASONE-SALMETEROL 250-50 MCG/DOSE IN AEPB: 1.0000 | INHALATION_SPRAY | Freq: Two times a day (BID) | RESPIRATORY_TRACT | 2 refills | Status: AC

## 2019-11-11 MED ORDER — SPIRONOLACTONE 25 MG PO TABS
25.0000 mg | ORAL_TABLET | Freq: Every day | ORAL | Status: DC
  Administered 2019-11-11: 08:00:00 25 mg via ORAL
  Filled 2019-11-11: qty 1

## 2019-11-11 MED ORDER — AMLODIPINE BESYLATE 10 MG PO TABS: 10.0000 mg | ORAL_TABLET | Freq: Every day | ORAL | 1 refills | Status: DC

## 2019-11-11 MED ORDER — BUDESONIDE 0.5 MG/2ML IN SUSP
RESPIRATORY_TRACT | Status: AC
  Filled 2019-11-11: qty 2

## 2019-11-11 MED ORDER — BUDESONIDE 0.5 MG/2ML IN SUSP
0.5000 mg | Freq: Two times a day (BID) | RESPIRATORY_TRACT | Status: DC
  Administered 2019-11-11: 08:00:00 0.5 mg via RESPIRATORY_TRACT

## 2019-11-11 MED ORDER — HYDROCHLOROTHIAZIDE 25 MG PO TABS
25.0000 mg | ORAL_TABLET | Freq: Every day | ORAL | Status: DC
  Administered 2019-11-11: 08:00:00 25 mg via ORAL
  Filled 2019-11-11: qty 1

## 2019-11-11 NOTE — Progress Notes (Signed)
IV, telemetry removed. D/C instructions reviewed with patient, verbalized understanding. Transported to private vehicle via wheelchair.

## 2019-11-11 NOTE — Discharge Summary (Signed)
Physician Discharge Summary  Caleb Perkins BHA:[id number] DOB: 05/08/1976 DOA: 11/09/2019  PCP: Patient, No Pcp Per  Admit date: 11/09/2019 Discharge date: 11/11/2019  Time spent: 35 minutes  Recommendations for Outpatient Follow-up:  1. Repeat basic metabolic panel to follow electrolytes and renal function 2. Reassess blood pressure and further adjust antihypertensive regimen. 3. Continue assisting patient with smoking cessation.   Discharge Diagnoses:  Principal Problem:   Acute exacerbation of chronic obstructive pulmonary disease (COPD) ([place]) Active Problems:   Acute bronchitis   HTN (hypertension)   Tobacco abuse   Discharge Condition: Stable and improved.  Patient discharged home with instruction to follow-up with PCP in 10 days.  Diet recommendation: Heart healthy diet.   Filed Weights   11/09/19 0905 11/10/19 1445  Weight: 77.6 kg 77.6 kg    History of present illness:  H&P written by Dr. Denton Brick on 11/09/2019 43 y.o. male with medical history significant for HTN, tobacco abuse.  Patient presented to the ED with complaints of difficulty breathing cough nasal and chest congestion of 3 days duration with wheezing. He is unaware of fever or chills.  Denies contacts with Covid positive patients or large gatherings.  Smokes half a pack of cigarettes daily.  ED Course: Systolic blood pressure 973Z to 180s, O2 sats greater than 93% on room air.  Temperature 99.1.  Chest x-ray negative for acute abnormality.  WBC 6.8.  Potassium 3.1.  IV Solu-Medrol, magnesium, Combivent inhaler albuterol inhaler given without significant improvement in rhonchi/wheezing, on attempts at ambulation patient became tachypneic.  IV ceftriaxone and doxycycline also given.  COVID-19 test is pending.  Hospitalist called to admit for further evaluation and management.  Hospital Course:  1-acute COPD exacerbation -Significantly improved with time of discharge and no requiring oxygen  supplementation -Patient has been discharge on a steroids tapering, albuterol and initiation of Advair.  He will also complete treatment with oral doxycycline for component of acute bronchitis/bronchiectasis. -Patient will require follow-up with pulmonologist for PFTs and further adjustment of maintenance treatment -Advised to stop smoking.  2-essential hypertension -Blood pressure better even still above goal -Patient has been discharged on amlodipine and also the use of combo pill of Aldactone/HCTZ -Patient will require repeat basic metabolic panel follow-up visit to reassess electrolytes and also to have reassessment of high blood pressure with further adjustment to medications as needed. -Advised to follow heart healthy diet.  3-tobacco abuse -Smoking cessation has been advised -Patient instructed to use nicotine patch to assist in quitting.  4-hypokalemia-electrolytes has been repleted -Repeat basic metabolic panel follow-up visit to reassess trend.  5-hyperglycemia -no prior hx of diabetes -CBG's elevated most likely from steroids usage -Check A1C and follow CBG's.  Procedures:  See below for x-ray reports.  Consultations:  None  Discharge Exam: Vitals:   11/11/19 1402 11/11/19 1408  BP:  (!) 182/114  Pulse:  (!) 109  Resp:  19  Temp:    SpO2: 96% 100%    General: No requiring oxygen supplementation, afebrile, no chest pain, no nausea, no vomiting.  Patient is speaking in full sentences and feeling ready to go home. Cardiovascular: S1-S2, no rubs, no gallops, no JVD on exam. Respiratory: Positive scattered rhonchi; currently no expiratory wheezing appreciated.  Improved air movement bilaterally, no using accessory muscles.  Normal respiratory effort. Abdomen: Soft, nontender, distended, positive bowel sounds. Extremities: No edema, no cyanosis, no clubbing.  Discharge Instructions   Discharge Instructions    Diet - low sodium heart healthy   Complete by: As  directed    Discharge instructions   Complete by: As directed    Stop smoking Continue liquid hydration Take medications as prescribed Follow heart healthy diet Arrange follow-up with PCP in 10 days.     Allergies as of 11/11/2019   No Known Allergies     Medication List    TAKE these medications   albuterol 108 (90 Base) MCG/ACT inhaler Commonly known as: VENTOLIN HFA Inhale 2 puffs into the lungs every 6 (six) hours as needed for wheezing or shortness of breath.   amLODipine 10 MG tablet Commonly known as: NORVASC Take 1 tablet (10 mg total) by mouth daily. Start taking on: November 12, 2019   doxycycline 100 MG tablet Commonly known as: VIBRA-TABS Take 1 tablet (100 mg total) by mouth 2 (two) times daily.   Fluticasone-Salmeterol 250-50 MCG/DOSE Aepb Commonly known as: Advair Diskus Inhale 1 puff into the lungs 2 (two) times daily.   predniSONE 20 MG tablet Commonly known as: Deltasone 3 tablets by mouth daily x1 day; then 2 tablets by mouth daily x2 days; then 1 tablet by mouth daily x3 days; day and half dollar by mouth daily x3 days stop prednisone.   spironolactone-hydrochlorothiazide 25-25 MG tablet Commonly known as: ALDACTAZIDE Take 1 tablet by mouth daily.      No Known Allergies   The results of significant diagnostics from this hospitalization (including imaging, microbiology, ancillary and laboratory) are listed below for reference.    Significant Diagnostic Studies: Dg Chest Port 1 View  Result Date: 11/09/2019 CLINICAL DATA:  Cough for 4 days. EXAM: PORTABLE CHEST 1 VIEW COMPARISON:  None. FINDINGS: Lungs are clear. Heart size is normal. No pneumothorax or pleural fluid. No acute or focal bony abnormality. IMPRESSION: Negative chest. Electronically Signed   By: Drusilla Kannerhomas  Dalessio M.D.   On: 11/09/2019 12:38    Microbiology: Recent Results (from the past 240 hour(s))  SARS CORONAVIRUS 2 (TAT 6-24 HRS) Nasopharyngeal Nasopharyngeal Swab      Status: None   Collection Time: 11/09/19  1:20 PM   Specimen: Nasopharyngeal Swab  Result Value Ref Range Status   SARS Coronavirus 2 NEGATIVE NEGATIVE Final    Comment: (NOTE) SARS-CoV-2 target nucleic acids are NOT DETECTED. The SARS-CoV-2 RNA is generally detectable in upper and lower respiratory specimens during the acute phase of infection. Negative results do not preclude SARS-CoV-2 infection, do not rule out co-infections with other pathogens, and should not be used as the sole basis for treatment or other patient management decisions. Negative results must be combined with clinical observations, patient history, and epidemiological information. The expected result is Negative. Fact Sheet for Patients: HairSlick.nohttps://www.fda.gov/media/138098/download Fact Sheet for Healthcare Providers: quierodirigir.comhttps://www.fda.gov/media/138095/download This test is not yet approved or cleared by the [place] FDA and  has been authorized for detection and/or diagnosis of SARS-CoV-2 by FDA under an Emergency Use Authorization (EUA). This EUA will remain  in effect (meaning this test can be used) for the duration of the COVID-19 declaration under Section 56 4(b)(1) of the Act, 21 U.S.C. section 360bbb-3(b)(1), unless the authorization is terminated or revoked sooner. Performed at [facility], [address] [place], [place] [address]      Labs: Basic Metabolic Panel: Recent Labs  Lab 11/09/19 1450 11/10/19 0447  NA 137 139  K 3.1* 4.0  CL 104 107  CO2 23 20*  GLUCOSE 107* 186*  BUN 15 18  CREATININE 0.89 0.93  CALCIUM 9.0 9.4  MG 2.2  --  Liver Function Tests: Recent Labs  Lab 11/09/19 1450  AST 21  ALT 17  ALKPHOS 76  BILITOT 0.8  PROT 7.7  ALBUMIN 4.4   CBC: Recent Labs  Lab 11/09/19 1450  WBC 6.8  NEUTROABS 4.8  HGB 16.1  HCT 48.3  MCV 97.6  PLT 165    Signed:  Vassie Loll MD.  [facility] 11/11/2019, 4:48 PM

## 2020-12-01 IMAGING — DX DG CHEST 1V PORT
2 series · 2 of 2 positions shown · non-contrast
Comparison: None.

CLINICAL DATA: Cough for 4 days.

EXAM:
PORTABLE CHEST 1 VIEW

[chest ap (1 of 2)]
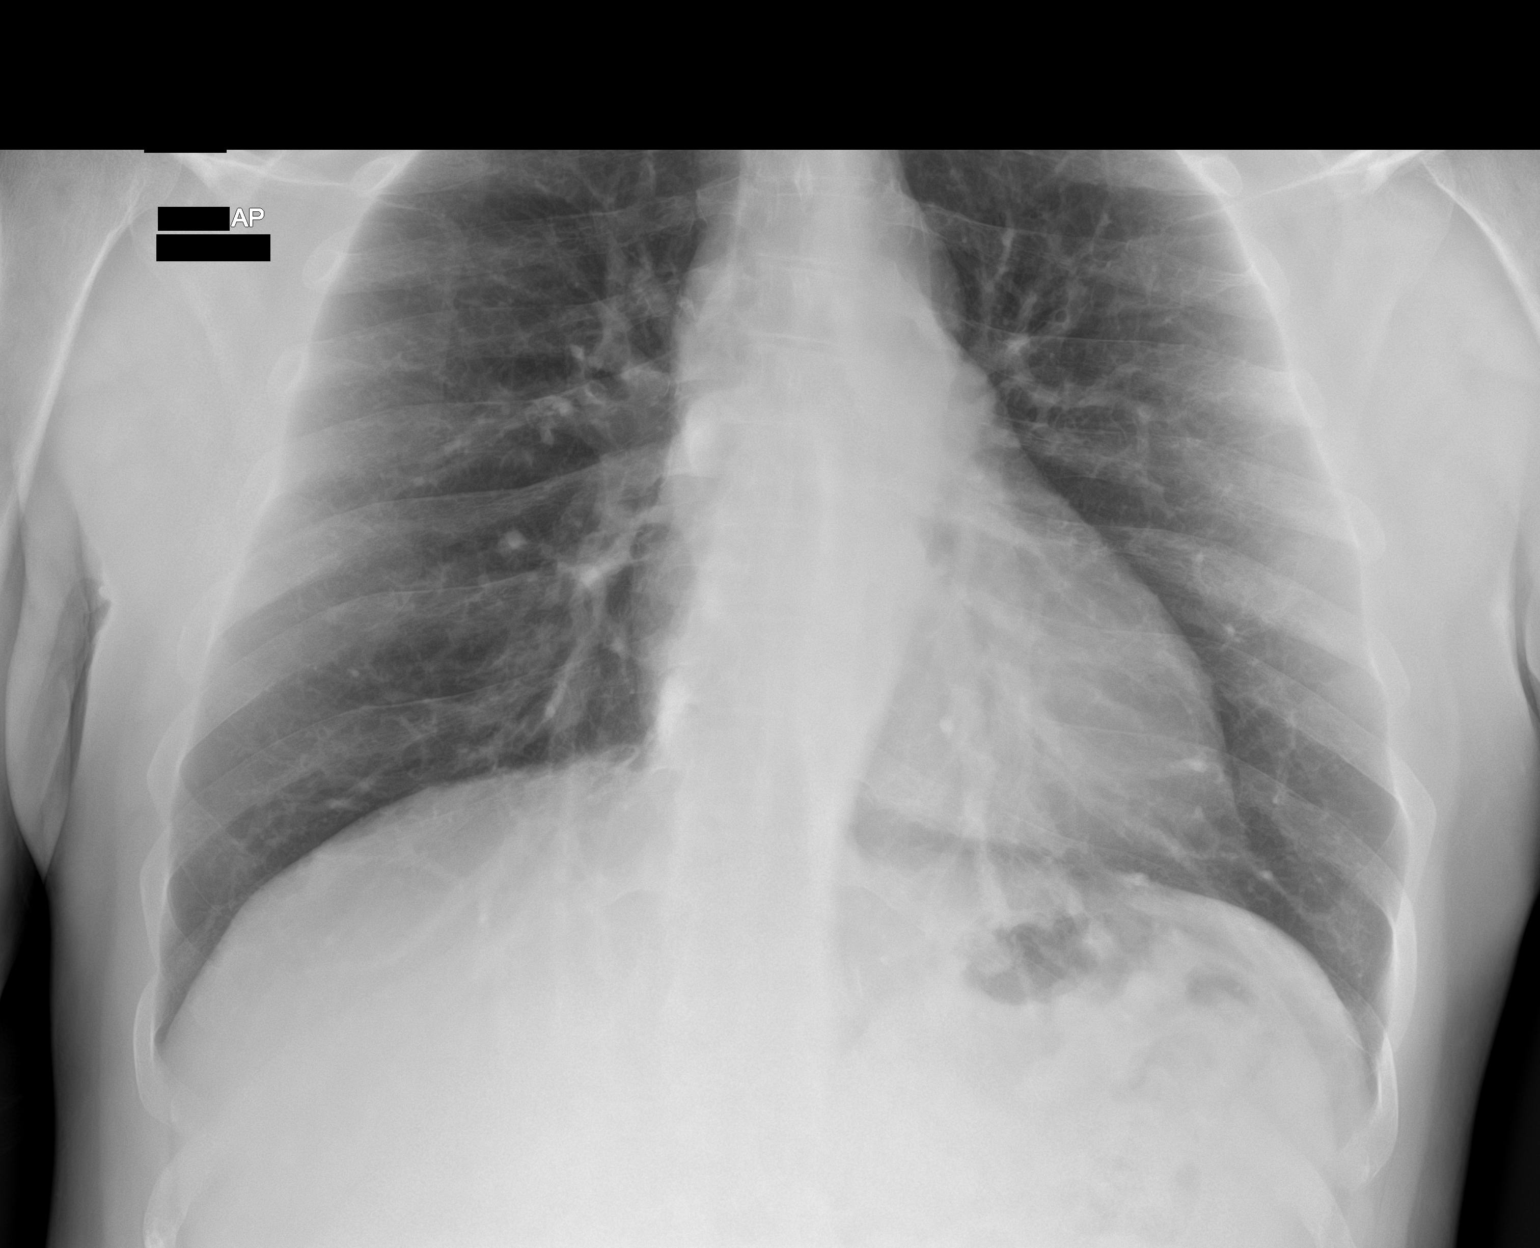

[chest ap (2 of 2)]
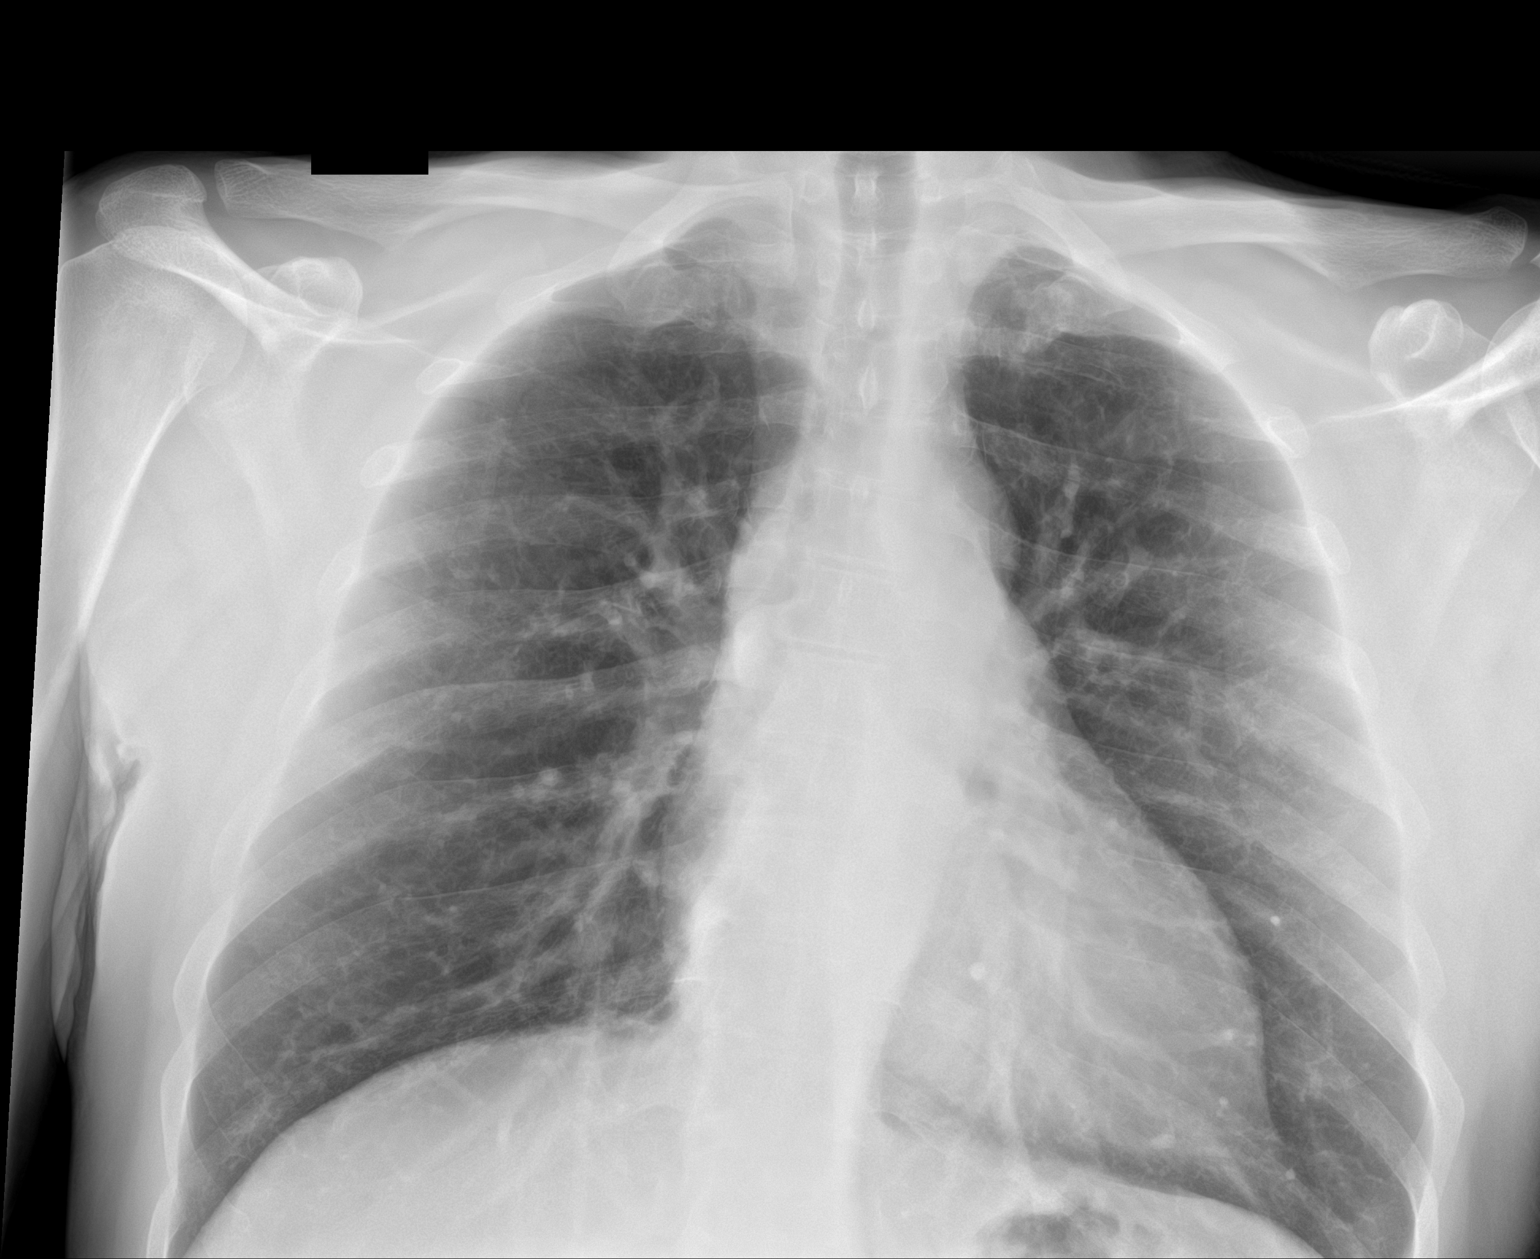

[2 of 2 positions shown; findings below may reference images not displayed]

FINDINGS: Lungs are clear. Heart size is normal. No pneumothorax or pleural
fluid. No acute or focal bony abnormality.
IMPRESSION: Negative chest.

## 2023-04-19 ENCOUNTER — Emergency Department (HOSPITAL_COMMUNITY): Payer: Medicaid Other

## 2023-04-19 ENCOUNTER — Encounter (HOSPITAL_COMMUNITY): Payer: Self-pay | Admitting: *Deleted

## 2023-04-19 ENCOUNTER — Inpatient Hospital Stay (HOSPITAL_COMMUNITY)
Admission: EM | Admit: 2023-04-19 | Discharge: 2023-05-01 | DRG: 233 | Disposition: A | Discharge status: Home / Self Care | Payer: Medicaid Other | Attending: Thoracic Surgery (Cardiothoracic Vascular Surgery) | Admitting: Thoracic Surgery (Cardiothoracic Vascular Surgery)

## 2023-04-19 ENCOUNTER — Inpatient Hospital Stay (HOSPITAL_COMMUNITY): Payer: Medicaid Other

## 2023-04-19 ENCOUNTER — Other Ambulatory Visit: Payer: Self-pay

## 2023-04-19 DIAGNOSIS — J9811 Atelectasis: Secondary | ICD-10-CM | POA: Diagnosis not present

## 2023-04-19 DIAGNOSIS — D696 Thrombocytopenia, unspecified: Secondary | ICD-10-CM | POA: Diagnosis not present

## 2023-04-19 DIAGNOSIS — R079 Chest pain, unspecified: Secondary | ICD-10-CM | POA: Diagnosis not present

## 2023-04-19 DIAGNOSIS — F1210 Cannabis abuse, uncomplicated: Secondary | ICD-10-CM | POA: Diagnosis present

## 2023-04-19 DIAGNOSIS — I2510 Atherosclerotic heart disease of native coronary artery without angina pectoris: Secondary | ICD-10-CM | POA: Diagnosis present

## 2023-04-19 DIAGNOSIS — F419 Anxiety disorder, unspecified: Secondary | ICD-10-CM | POA: Diagnosis present

## 2023-04-19 DIAGNOSIS — F17210 Nicotine dependence, cigarettes, uncomplicated: Secondary | ICD-10-CM | POA: Diagnosis present

## 2023-04-19 DIAGNOSIS — E440 Moderate protein-calorie malnutrition: Secondary | ICD-10-CM | POA: Insufficient documentation

## 2023-04-19 DIAGNOSIS — I5021 Acute systolic (congestive) heart failure: Secondary | ICD-10-CM | POA: Diagnosis not present

## 2023-04-19 DIAGNOSIS — Z7951 Long term (current) use of inhaled steroids: Secondary | ICD-10-CM

## 2023-04-19 DIAGNOSIS — E785 Hyperlipidemia, unspecified: Secondary | ICD-10-CM | POA: Diagnosis present

## 2023-04-19 DIAGNOSIS — I5043 Acute on chronic combined systolic (congestive) and diastolic (congestive) heart failure: Secondary | ICD-10-CM | POA: Diagnosis present

## 2023-04-19 DIAGNOSIS — Z79899 Other long term (current) drug therapy: Secondary | ICD-10-CM | POA: Diagnosis not present

## 2023-04-19 DIAGNOSIS — I719 Aortic aneurysm of unspecified site, without rupture: Secondary | ICD-10-CM | POA: Diagnosis not present

## 2023-04-19 DIAGNOSIS — I471 Supraventricular tachycardia, unspecified: Secondary | ICD-10-CM | POA: Diagnosis not present

## 2023-04-19 DIAGNOSIS — Z1152 Encounter for screening for COVID-19: Secondary | ICD-10-CM | POA: Diagnosis not present

## 2023-04-19 DIAGNOSIS — I214 Non-ST elevation (NSTEMI) myocardial infarction: Secondary | ICD-10-CM | POA: Diagnosis not present

## 2023-04-19 DIAGNOSIS — Z91119 Patient's noncompliance with dietary regimen due to unspecified reason: Secondary | ICD-10-CM

## 2023-04-19 DIAGNOSIS — I472 Ventricular tachycardia, unspecified: Secondary | ICD-10-CM | POA: Diagnosis not present

## 2023-04-19 DIAGNOSIS — I272 Pulmonary hypertension, unspecified: Secondary | ICD-10-CM | POA: Diagnosis present

## 2023-04-19 DIAGNOSIS — J449 Chronic obstructive pulmonary disease, unspecified: Secondary | ICD-10-CM | POA: Diagnosis present

## 2023-04-19 DIAGNOSIS — Z91148 Patient's other noncompliance with medication regimen for other reason: Secondary | ICD-10-CM

## 2023-04-19 DIAGNOSIS — I480 Paroxysmal atrial fibrillation: Secondary | ICD-10-CM | POA: Diagnosis not present

## 2023-04-19 DIAGNOSIS — Z951 Presence of aortocoronary bypass graft: Secondary | ICD-10-CM

## 2023-04-19 DIAGNOSIS — I2584 Coronary atherosclerosis due to calcified coronary lesion: Secondary | ICD-10-CM | POA: Diagnosis not present

## 2023-04-19 DIAGNOSIS — I43 Cardiomyopathy in diseases classified elsewhere: Secondary | ICD-10-CM | POA: Diagnosis not present

## 2023-04-19 DIAGNOSIS — Z803 Family history of malignant neoplasm of breast: Secondary | ICD-10-CM

## 2023-04-19 DIAGNOSIS — Z6820 Body mass index (BMI) 20.0-20.9, adult: Secondary | ICD-10-CM

## 2023-04-19 DIAGNOSIS — Z8249 Family history of ischemic heart disease and other diseases of the circulatory system: Secondary | ICD-10-CM | POA: Diagnosis not present

## 2023-04-19 DIAGNOSIS — D62 Acute posthemorrhagic anemia: Secondary | ICD-10-CM | POA: Diagnosis not present

## 2023-04-19 DIAGNOSIS — I509 Heart failure, unspecified: Secondary | ICD-10-CM | POA: Diagnosis not present

## 2023-04-19 DIAGNOSIS — I161 Hypertensive emergency: Principal | ICD-10-CM | POA: Diagnosis present

## 2023-04-19 DIAGNOSIS — I7121 Aneurysm of the ascending aorta, without rupture: Secondary | ICD-10-CM | POA: Diagnosis present

## 2023-04-19 DIAGNOSIS — I110 Hypertensive heart disease with heart failure: Secondary | ICD-10-CM | POA: Diagnosis present

## 2023-04-19 DIAGNOSIS — I255 Ischemic cardiomyopathy: Secondary | ICD-10-CM | POA: Diagnosis not present

## 2023-04-19 DIAGNOSIS — Z87891 Personal history of nicotine dependence: Secondary | ICD-10-CM | POA: Diagnosis not present

## 2023-04-19 DIAGNOSIS — Z01810 Encounter for preprocedural cardiovascular examination: Secondary | ICD-10-CM | POA: Diagnosis not present

## 2023-04-19 DIAGNOSIS — Z91199 Patient's noncompliance with other medical treatment and regimen due to unspecified reason: Secondary | ICD-10-CM

## 2023-04-19 DIAGNOSIS — D72829 Elevated white blood cell count, unspecified: Secondary | ICD-10-CM | POA: Diagnosis not present

## 2023-04-19 LAB — COMPREHENSIVE METABOLIC PANEL
ALT: 20 U/L (ref 0–44)
AST: 28 U/L (ref 15–41)
Albumin: 4.4 g/dL (ref 3.5–5.0)
Alkaline Phosphatase: 58 U/L (ref 38–126)
Anion gap: 10 (ref 5–15)
BUN: 19 mg/dL (ref 6–20)
CO2: 23 mmol/L (ref 22–32)
Calcium: 9.1 mg/dL (ref 8.9–10.3)
Chloride: 107 mmol/L (ref 98–111)
Creatinine, Ser: 1.08 mg/dL (ref 0.61–1.24)
GFR, Estimated: >60 mL/min (ref >60)
Glucose, Bld: 111 mg/dL — ABNORMAL HIGH (ref 70–99)
Potassium: 3.9 mmol/L (ref 3.5–5.1)
Sodium: 140 mmol/L (ref 135–145)
Total Bilirubin: 0.5 mg/dL (ref 0.3–1.2)
Total Protein: 7.5 g/dL (ref 6.5–8.1)

## 2023-04-19 LAB — CBC WITH DIFFERENTIAL/PLATELET
Abs Immature Granulocytes: 0.02 10*3/uL (ref 0.00–0.07)
Basophils Absolute: 0.1 10*3/uL (ref 0.0–0.1)
Basophils Relative: 1 %
Eosinophils Absolute: 0.3 10*3/uL (ref 0.0–0.5)
Eosinophils Relative: 3 %
HCT: 47.9 % (ref 39.0–52.0)
Hemoglobin: 15.4 g/dL (ref 13.0–17.0)
Immature Granulocytes: 0 %
Lymphocytes Relative: 16 %
Lymphs Abs: 1.4 10*3/uL (ref 0.7–4.0)
MCH: 31.6 pg (ref 26.0–34.0)
MCHC: 32.2 g/dL (ref 30.0–36.0)
MCV: 98.2 fL (ref 80.0–100.0)
Monocytes Absolute: 0.5 10*3/uL (ref 0.1–1.0)
Monocytes Relative: 6 %
Neutro Abs: 6.5 10*3/uL (ref 1.7–7.7)
Neutrophils Relative %: 74 %
Platelets: 158 10*3/uL (ref 150–400)
RBC: 4.88 MIL/uL (ref 4.22–5.81)
RDW: 12.6 % (ref 11.5–15.5)
WBC: 8.8 10*3/uL (ref 4.0–10.5)
nRBC: 0 % (ref 0.0–0.2)

## 2023-04-19 LAB — ECHOCARDIOGRAM COMPLETE
AR max vel: 4.04 cm2
AV Area VTI: 5.54 cm2
AV Area mean vel: 4.64 cm2
AV Mean grad: 1.4 mmHg
AV Peak grad: 3.1 mmHg
Ao pk vel: 0.88 m/s
Area-P 1/2: 4.31 cm2
Calc EF: 44.6 %
Height: 70 in
S' Lateral: 4.5 cm
Single Plane A2C EF: 45 %
Single Plane A4C EF: 47.7 %
Weight: 2800 oz

## 2023-04-19 LAB — MRSA NEXT GEN BY PCR, NASAL: MRSA by PCR Next Gen: NOT DETECTED

## 2023-04-19 LAB — LIPASE, BLOOD: Lipase: 35 U/L (ref 11–51)

## 2023-04-19 LAB — URINALYSIS, ROUTINE W REFLEX MICROSCOPIC
Bilirubin Urine: NEGATIVE
Glucose, UA: NEGATIVE mg/dL
Hgb urine dipstick: NEGATIVE
Ketones, ur: NEGATIVE mg/dL
Leukocytes,Ua: NEGATIVE
Nitrite: NEGATIVE
Protein, ur: NEGATIVE mg/dL
Specific Gravity, Urine: 1.01 (ref 1.005–1.030)
pH: 7 (ref 5.0–8.0)

## 2023-04-19 LAB — RAPID URINE DRUG SCREEN, HOSP PERFORMED
Amphetamines: NOT DETECTED
Barbiturates: NOT DETECTED
Benzodiazepines: NOT DETECTED
Cocaine: NOT DETECTED
Opiates: NOT DETECTED
Tetrahydrocannabinol: POSITIVE — AB

## 2023-04-19 LAB — TROPONIN I (HIGH SENSITIVITY)
Troponin I (High Sensitivity): 78 ng/L — ABNORMAL HIGH (ref <18)
Troponin I (High Sensitivity): 102 ng/L (ref <18)

## 2023-04-19 LAB — BRAIN NATRIURETIC PEPTIDE: B Natriuretic Peptide: 495 pg/mL — ABNORMAL HIGH (ref 0.0–100.0)

## 2023-04-19 MED ORDER — LABETALOL HCL 5 MG/ML IV SOLN
20.0000 mg | Freq: Once | INTRAVENOUS | Status: AC
  Administered 2023-04-19: 20 mg via INTRAVENOUS
  Filled 2023-04-19: qty 4

## 2023-04-19 MED ORDER — FUROSEMIDE 10 MG/ML IJ SOLN
40.0000 mg | Freq: Once | INTRAMUSCULAR | Status: AC
  Administered 2023-04-19: 40 mg via INTRAVENOUS
  Filled 2023-04-19: qty 4

## 2023-04-19 MED ORDER — CHLORHEXIDINE GLUCONATE CLOTH 2 % EX PADS
6.0000 | MEDICATED_PAD | Freq: Every day | CUTANEOUS | Status: DC
  Administered 2023-04-19 – 2023-04-23 (×5): 6 via TOPICAL

## 2023-04-19 MED ORDER — IOHEXOL 350 MG/ML SOLN
100.0000 mL | Freq: Once | INTRAVENOUS | Status: AC | PRN
  Administered 2023-04-19: 100 mL via INTRAVENOUS

## 2023-04-19 MED ORDER — ASPIRIN 81 MG PO TBEC
81.0000 mg | DELAYED_RELEASE_TABLET | Freq: Every day | ORAL | Status: DC
  Administered 2023-04-19 – 2023-04-24 (×6): 81 mg via ORAL
  Filled 2023-04-19 (×6): qty 1

## 2023-04-19 MED ORDER — AMLODIPINE BESYLATE 5 MG PO TABS
10.0000 mg | ORAL_TABLET | Freq: Every day | ORAL | Status: DC
  Administered 2023-04-19 – 2023-04-21 (×3): 10 mg via ORAL
  Filled 2023-04-19 (×3): qty 2

## 2023-04-19 MED ORDER — ACETAMINOPHEN 325 MG PO TABS
650.0000 mg | ORAL_TABLET | Freq: Four times a day (QID) | ORAL | Status: DC | PRN
  Administered 2023-04-20 – 2023-04-22 (×3): 650 mg via ORAL
  Filled 2023-04-19 (×2): qty 2

## 2023-04-19 MED ORDER — CARVEDILOL 3.125 MG PO TABS
6.2500 mg | ORAL_TABLET | Freq: Two times a day (BID) | ORAL | Status: DC
  Administered 2023-04-19 – 2023-04-22 (×6): 6.25 mg via ORAL
  Filled 2023-04-19 (×5): qty 2

## 2023-04-19 MED ORDER — SPIRONOLACTONE 25 MG PO TABS
25.0000 mg | ORAL_TABLET | Freq: Every day | ORAL | Status: DC
  Administered 2023-04-19 – 2023-04-24 (×6): 25 mg via ORAL
  Filled 2023-04-19 (×6): qty 1

## 2023-04-19 MED ORDER — ONDANSETRON HCL 4 MG PO TABS: 4.0000 mg | ORAL_TABLET | Freq: Four times a day (QID) | ORAL | Status: DC | PRN

## 2023-04-19 MED ORDER — ATORVASTATIN CALCIUM 40 MG PO TABS
40.0000 mg | ORAL_TABLET | Freq: Every day | ORAL | Status: DC
  Administered 2023-04-19 – 2023-04-24 (×6): 40 mg via ORAL
  Filled 2023-04-19 (×6): qty 1

## 2023-04-19 MED ORDER — ACETAMINOPHEN 650 MG RE SUPP: 650.0000 mg | Freq: Four times a day (QID) | RECTAL | Status: DC | PRN

## 2023-04-19 MED ORDER — HYDRALAZINE HCL 25 MG PO TABS
25.0000 mg | ORAL_TABLET | Freq: Four times a day (QID) | ORAL | Status: DC | PRN
  Administered 2023-04-19 – 2023-04-20 (×3): 25 mg via ORAL
  Filled 2023-04-19 (×3): qty 1

## 2023-04-19 MED ORDER — ENOXAPARIN SODIUM 40 MG/0.4ML IJ SOSY
40.0000 mg | PREFILLED_SYRINGE | INTRAMUSCULAR | Status: DC
  Administered 2023-04-19 – 2023-04-21 (×3): 40 mg via SUBCUTANEOUS
  Filled 2023-04-19 (×3): qty 0.4

## 2023-04-19 MED ORDER — ONDANSETRON HCL 4 MG/2ML IJ SOLN: 4.0000 mg | Freq: Four times a day (QID) | INTRAMUSCULAR | Status: DC | PRN

## 2023-04-19 MED ORDER — LABETALOL HCL 5 MG/ML IV SOLN: 20.0000 mg | Freq: Once | INTRAVENOUS | Status: DC

## 2023-04-19 NOTE — Progress Notes (Signed)
  Echocardiogram 2D Echocardiogram has been performed.  Milda Smart 04/19/2023, 12:11 PM

## 2023-04-19 NOTE — ED Triage Notes (Signed)
Pt c/o left side chest pain that started yesterday after waking up from a nap  Pt c/o sob and states he has been diaphoretic  Pt describes the pain as a tightness with aching

## 2023-04-19 NOTE — Consult Note (Addendum)
Cardiology Consultation   Patient ID: Caleb Perkins MRN: [id number]; DOB: Mar 19, 1976  Admit date: 04/19/2023 Date of Consult: 04/19/2023  PCP:  Patient, No Pcp Per   [facility] Providers Cardiologist:  New to [facility] - Dr. Wyline Mood  Patient Profile:   Caleb Perkins is a 47 y.o. male with a hx of HTN and tobacco use who is being seen 04/19/2023 for the evaluation of hypertensive urgency and elevated troponin values at the request of Dr. Charm Barges.  History of Present Illness:   Caleb Perkins presented to [facility] ED this morning for evaluation of chest pain and shortness of breath. In talking with the patient today, he reports having worsening dyspnea on exertion for the past few months. Says he was previously able to walk for several miles a day but now gets short of breath with walking with minimal distance. He reports having chest discomfort as well for the past week and this can occur at rest or with activity and typically occurs when he has difficulty catching his breath. Denies any specific orthopnea or PND but has noticed intermittent ankle edema. Says he has been without blood pressure medications for several years. He was previously homeless but now has a residence. Says he would be able to obtain medications now. Does consume a significant amount of sodium as he works at [employer] and says he eats there daily. He is unaware of any known history of CHF but reports having what sounds like a heart catheterization at [facility] in [place], [place] in 2011.  Does not think he had a stent placed at that time.  No recent cardiac procedures.   He was found to have hypertensive emergency with initial BP at 228/156. Initial labs showed WBC 8.8, Hgb 15.4, platelets 158, Na+ 140, K+ 3.9 and creatinine 1.08. AST 28 and ALT 20. Lipase normal at 35. BNP elevated to 495. Initial Hs Troponin 78 with repeat pending. CXR showing interstitial lung markings which has progressed since 2020  and may reflect chronic lung disease. CTA showed no evidence of aortic dissection but was noted to have dilation of the ascending aorta measuring 4.1 cm with annual imaging recommended. Also noted to have coronary calcifications, interlobular septal thickening in both lungs with a small right pleural effusion and patchy ground-glass opacities in the right upper and lower lobes most consistent with pulmonary interstitial edema and superimposed infection or inflammation with follow-up CT recommended in 3 months. EKG shows NSR, HR 92 with LVH and ST abnormalities along V4-V6 which is most consistent with repol and is similar to prior tracings.    Past Medical History:  Diagnosis Date   Blood transfusion without reported diagnosis    Hypertension     History reviewed. No pertinent surgical history.   Home Medications:  Prior to Admission medications   Medication Sig Start Date End Date Taking? Authorizing Provider  albuterol (VENTOLIN HFA) 108 (90 Base) MCG/ACT inhaler Inhale 2 puffs into the lungs every 6 (six) hours as needed for wheezing or shortness of breath. 11/11/19   Vassie Loll, MD  amLODipine (NORVASC) 10 MG tablet Take 1 tablet (10 mg total) by mouth daily. 11/12/19   Vassie Loll, MD  doxycycline (VIBRA-TABS) 100 MG tablet Take 1 tablet (100 mg total) by mouth 2 (two) times daily. 11/11/19   Vassie Loll, MD  Fluticasone-Salmeterol (ADVAIR DISKUS) 250-50 MCG/DOSE AEPB Inhale 1 puff into the lungs 2 (two) times daily. 11/11/19 02/09/20  Vassie Loll, MD  predniSONE (DELTASONE)  20 MG tablet 3 tablets by mouth daily x1 day; then 2 tablets by mouth daily x2 days; then 1 tablet by mouth daily x3 days; day and half dollar by mouth daily x3 days stop prednisone. 11/11/19   Vassie Loll, MD  spironolactone-hydrochlorothiazide (ALDACTAZIDE) 25-25 MG tablet Take 1 tablet by mouth daily. 11/11/19   Vassie Loll, MD    Inpatient Medications: Scheduled Meds:  Continuous  Infusions:  PRN Meds:   Allergies:   No Known Allergies  Social History:   Social History   Socioeconomic History   Marital status: Single    Spouse name: Not on file   Number of children: Not on file   Years of education: Not on file   Highest education level: Not on file  Occupational History   Not on file  Tobacco Use   Smoking status: Every Day    Packs/day: .5    Types: Cigarettes   Smokeless tobacco: Never  Vaping Use   Vaping Use: Never used  Substance and Sexual Activity   Alcohol use: Not Currently   Drug use: Yes    Types: Marijuana   Sexual activity: Not on file  Other Topics Concern   Not on file  Social History Narrative   Not on file   Social Determinants of Health   Financial Resource Strain: Not on file  Food Insecurity: Not on file  Transportation Needs: Not on file  Physical Activity: Not on file  Stress: Not on file  Social Connections: Not on file  Intimate Partner Violence: Not on file    Family History:   Family History  Problem Relation Age of Onset   Hypertension Mother      ROS:  Please see the history of present illness.   All other ROS reviewed and negative.     Physical Exam/Data:   Vitals:   04/19/23 0800 04/19/23 0830 04/19/23 0900 04/19/23 0930  BP: (!) 187/138 (!) 165/114 (!) 171/125 (!) 170/118  Pulse: 88 78 72 80  Resp: (!) 24 17 18 19   Temp:      TempSrc:      SpO2: 91% 95% 98% 98%  Weight:      Height:       No intake or output data in the 24 hours ending 04/19/23 1112    04/19/2023    7:27 AM 11/10/2019    2:45 PM 11/09/2019    9:05 AM  Last 3 Weights  Weight (lbs) 175 lb 171 lb 171 lb  Weight (kg) 79.379 kg 77.565 kg 77.565 kg     Body mass index is 25.11 kg/m.  General:  Well nourished, well developed male appearing in no acute distress.  HEENT: normal Neck: no JVD Vascular: No carotid bruits; Distal pulses 2+ bilaterally Cardiac:  normal S1, S2; RRR; no murmur  Lungs: Rhonchi throughout.  Abd:  soft, nontender, no hepatomegaly  Ext: no pitting edema Musculoskeletal:  No deformities, BUE and BLE strength normal and equal Skin: warm and dry  Neuro:  CNs 2-12 intact, no focal abnormalities noted Psych:  Normal affect   EKG:  The EKG was personally reviewed and demonstrates: NSR, HR 92 with LVH and ST abnormalities along V4-V6 which is most consistent with repol and is similar to prior tracings.   Telemetry:  Telemetry was personally reviewed and demonstrates: NSR, HR in 70's to 80's. Brief burst of SVT lasting for 6.8 seconds.  Relevant CV Studies:  Echocardiogram: Pending  Laboratory Data:  High Sensitivity Troponin:  Recent Labs  Lab 04/19/23 0741 04/19/23 0948  TROPONINIHS 78* 102*     Chemistry Recent Labs  Lab 04/19/23 0741  NA 140  K 3.9  CL 107  CO2 23  GLUCOSE 111*  BUN 19  CREATININE 1.08  CALCIUM 9.1  GFRNONAA >60  ANIONGAP 10    Recent Labs  Lab 04/19/23 0741  PROT 7.5  ALBUMIN 4.4  AST 28  ALT 20  ALKPHOS 58  BILITOT 0.5   Lipids No results for input(s): "CHOL", "TRIG", "HDL", "LABVLDL", "LDLCALC", "CHOLHDL" in the last 168 hours.  Hematology Recent Labs  Lab 04/19/23 0741  WBC 8.8  RBC 4.88  HGB 15.4  HCT 47.9  MCV 98.2  MCH 31.6  MCHC 32.2  RDW 12.6  PLT 158   Thyroid No results for input(s): "TSH", "FREET4" in the last 168 hours.  BNP Recent Labs  Lab 04/19/23 0741  BNP 495.0*    DDimer No results for input(s): "DDIMER" in the last 168 hours.   Radiology/Studies:  CT Angio Chest/Abd/Pel for Dissection W and/or W/WO  Result Date: 04/19/2023 CLINICAL DATA:  Chest pain/tightness and shortness of breath. EXAM: CT ANGIOGRAPHY CHEST, ABDOMEN AND PELVIS TECHNIQUE: Multidetector CT imaging through the chest, abdomen and pelvis was performed using the standard protocol during bolus administration of intravenous contrast. Multiplanar reconstructed images and MIPs were obtained and reviewed to evaluate the vascular anatomy.  RADIATION DOSE REDUCTION: This exam was performed according to the departmental dose-optimization program which includes automated exposure control, adjustment of the mA and/or kV according to patient size and/or use of iterative reconstruction technique. CONTRAST:  OMNIPAQUE IOHEXOL 350 MG/ML SOLN COMPARISON:  Same day chest radiograph FINDINGS: CTA CHEST FINDINGS Cardiovascular: The ascending thoracic aorta is mildly aneurysmal measuring up to 4.1 cm. There is no evidence of dissection. Age advanced coronary artery calcifications are noted. The heart size is normal. There is no pericardial effusion. Mediastinum/Nodes: The thyroid is unremarkable. The esophagus is grossly unremarkable. There is no mediastinal, hilar, or axillary lymphadenopathy. Lungs/Pleura: The trachea and central airways are patent with central bronchial wall thickening. There is diffuse interlobular septal thickening in both lungs. There are patchy ground-glass opacities in the right upper and lower lobes (7-62, 7-76, 7-99). There is a trace right pleural effusion. There is no pneumothorax. Musculoskeletal: There is no acute osseous abnormality or suspicious osseous lesion. Review of the MIP images confirms the above findings. CTA ABDOMEN AND PELVIS FINDINGS VASCULAR Aorta: Normal caliber aorta with mild calcified atherosclerotic plaque but no aneurysm, dissection, vasculitis or significant stenosis. Celiac: There is mild plaque of the origin and severe stenosis of the splenic artery (5-106). There is no evidence of aneurysm, dissection, or vasculitis. SMA: Patent without evidence of aneurysm, dissection, vasculitis or significant stenosis. Renals: Both renal arteries are patent without evidence of aneurysm, dissection, vasculitis, fibromuscular dysplasia or significant stenosis. IMA: Patent without evidence of aneurysm, dissection, vasculitis or significant stenosis. Inflow: Patent without evidence of aneurysm, dissection, vasculitis or  significant stenosis. Veins: No obvious venous abnormality within the limitations of this arterial phase study. Review of the MIP images confirms the above findings. NON-VASCULAR Hepatobiliary: The liver and gallbladder are unremarkable. There is no biliary ductal dilatation. Pancreas: Unremarkable. Spleen: Unremarkable. Adrenals/Urinary Tract: The adrenals are unremarkable. The kidneys are unremarkable, with no focal lesion, stone, hydronephrosis, or hydroureter. The bladder is unremarkable. Stomach/Bowel: The stomach is unremarkable. There is no evidence of bowel obstruction. There is no abnormal bowel wall thickening or inflammatory change. There is  colonic diverticulosis without evidence of acute diverticulitis. The appendix is normal. Lymphatic: There is no abdominal or pelvic lymphadenopathy. Reproductive: The prostate and seminal vesicles are unremarkable. Other: There is no ascites or free air. Musculoskeletal: There is no acute osseous abnormality or suspicious osseous lesion. Review of the MIP images confirms the above findings. IMPRESSION: 1. No evidence of acute aortic syndrome. 2. 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 Recommend annual imaging followup by CTA or MRA. 3. Age advanced coronary artery calcifications as well as atherosclerotic plaque in the abdominal aorta and Taleeyah Bora vessels resulting in severe stenosis of the splenic artery. 4. Interlobular septal thickening in both lungs with a small right pleural effusion and patchy ground-glass opacities in the right upper and lower lobes most in keeping with pulmonary interstitial edema and superimposed infection or inflammation. Recommend follow-up chest CT in 3 months to assess for resolution. 5. No acute findings in the abdomen or pelvis. 6. Diverticulosis without evidence of acute diverticulitis. Electronically Signed   By: Lesia Hausen M.D.   On: 04/19/2023 09:40   DG Chest Port 1 View  Result Date:  04/19/2023 CLINICAL DATA:  Chest pain for 3 days, cough EXAM: PORTABLE CHEST 1 VIEW COMPARISON:  Chest radiograph 11/09/2019 FINDINGS: The heart is at the upper limits of normal for size. The upper mediastinal contours are normal. There are diffusely increased and coarsened interstitial markings throughout both lungs which appears significantly worsened since 2020. There is more focal ill-defined airspace opacity in the right upper lobe in the suprahilar region. There is no pulmonary edema. There is no pleural effusion or pneumothorax There is no acute osseous abnormality. IMPRESSION: Diffusely increased and coarsened interstitial markings throughout both lungs appears significantly worsened since 2020 and may reflect worsened chronic lung disease, with possible superimposed acute airspace opacity in the right suprahilar region. Consider CT chest for better characterization. Electronically Signed   By: Lesia Hausen M.D.   On: 04/19/2023 08:02     Assessment and Plan:   1. Hypertensive Emergency - Initial BP at 228/156 and this is in the setting of dietary noncompliance and medication noncompliance as he has been without BP medications for several years. He did receive IV Labetalol while in the ED and SBP is currently in the 160's. Would not aggressively treat to lower than this today given this is likely longstanding hypertension. - He was previously on Amlodipine and Spironolactone and believes he tolerated both of these well. Would consider resuming and can add Coreg or Losartan pending BP trend. Says he was previously on Lopressor in the past and this did not help with his BP.   2. Acute CHF (echo pending to determine HFpEF or HFrEF) - BNP was elevated to 495 on admission and CT did show a small right pleural effusion. Would give IV Lasix today and assess his response. An echocardiogram is pending to assess for any structural abnormalities. He is at risk for hypertensive cardiomyopathy given his  longstanding hypertension.  3. Elevated Troponin Values/Coronary Calcification by CT - Initial Hs Troponin 78 with repeat pending. EKG shows NSR, HR 92 with LVH and ST abnormalities along V4-V6 which is most consistent with repol and is similar to prior tracings.  - Echo pending to assess for any structural abnormalities. He reports having a heart catheterization in 2011 but does not believe he required a stent at that time. We will see if records can be requested as this is not available in Care  Everywhere. - If his echocardiogram is reassuring and Hs Troponin values remain flat, he may be a good candidate for a Coronary CT as an outpatient.  4. Dilation of the Ascending Aorta - Measured 4.1 cm this admission. Will need repeat imaging in 1 year. Will need a follow-up CT prior to this though given his abnormal lung findings as discussed above with 3 month follow-up imaging recommended.   5. SVT - He did have a short burst of SVT on telemetry while in the ED. Continue to follow on telemetry. Will check TSH and Mg.     For questions or updates, please contact [facility] Please consult [email or web address] for contact info under    Signed, Ellsworth Lennox, PA-C  04/19/2023 11:12 AM  Attending note   Patient seen and discussed with PA Iran Ouch, I agree with her documentation. 47 yo male history of HTN with medication noncompliance, tobacco abuse, COPD, presents with left sided chest pain, SOB, N/V. Reports progressing SOB and chest pain over the last several weeks. Chest pain can last up to 10 hours, states it feels like someone punched him in the chest.    ER vitals: p 95 bp 228/156 99%   K 3.9 Cr 1.08 BUN 19 BNP 495 WBC 8.8 Hgb 15.4 Plt 158 Trop 78--> EKG SR, LVH strain pattern chronic pattern CXR diffuse intersitial markings  CTA C/A/P: aneurysm 4.1 cm ascending aorta, no acute aortic pathology. Age advanced coronary calcifications, splenic artery stenosis, pulm edema ? Infectious  process.        1.Hypertensive emergency - presented bp 228/156, evidence of fluid overload and demand ischemia - long history of medication noncompliance - already given IV labetalol in ER 20mg , SBP down to 160 - avoid over aggressive correction of bp. Restart his home norvasc, aldactone. I don't see indication for bp drip given SBP already has corrected.  - if 3rd agent needed would start ARB pending echo findings   - f/u echo - mild trop elevation thus far in setting of severe HTN, LVH, HF. Follow trend at this time. Does not require anticoagulation as of yet, I think demand ischemia as opposed to ACS.        2. Coronary atherosclerosis - advanced CAD noted on CTA - start ASA, statin - follow up echo, trop trend. At this time suspect demand ishcemia as opposed to ACS. His chest pain is atypical, lasting up to 10 hours. Symptoms already improved with lowered bp in ER.  - Pending additional data and symptoms with bp control consider ischemic testing, no acute indication at this time. If symptoms resolve with bp control and no significant trop elevation or echo findings would anticipate outpatient stress testing.    3.Acute heart failure, unknown type - in setting of severely uncontrolled HTN due to medication noncompliance - f/u echo - dose IV lasix 40mg  x 1 .   4. 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 Given his chest pain, mild trop elevation, and age advanced CAD on CTA will need cath to evaluate for ischemic cardiomyopathy. At this time still do not suspect ACS given presentation but could have chronic obstructive disease.  - add coreg 6.25 mg bid, likely would add entresto 24/26mg  bid tomorrow. If by chance need room with bp for HF agents can come  off norvasc, but suspect will have  plenty of bp to work with. Can start SGLT2i later in admission - over the weekend would diurese, work to control bp's. Would make npo Sunday night for likely cath Monday   Dina Rich MD       Dina Rich MD

## 2023-04-19 NOTE — H&P (Signed)
TRH H&P    Patient Demographics:    Caleb Perkins, is a 47 y.o. male  MRN: [id number]  DOB - 1976-05-27  Admit Date - 04/19/2023  Referring MD/NP/PA: Meridee Score  Outpatient Primary MD for the patient is Patient, No Pcp Per  Patient coming from: Home  Chief complaint-chest pain   HPI:    Caleb Perkins  is a 47 y.o. male, with history of hypertension, noncompliant with home medications who has not taken his medications for the last 1-1/2 years, came to hospital with worsening shortness of breath and chest pain.  Patient says that for past few weeks she has noticed dyspnea on exertion, also had left-sided chest pain.  Complains of intermittent nausea and vomiting.  In the ED he was found to be in hypertensive emergency with elevated troponin, BNP.  Blood pressure was elevated up to 202/145. Patient was given IV labetalol with improvement in blood pressure.  Patient's symptoms also improved.  Chest pain has resolved. Cardiology was consulted. Patient says that he moved and did not get refills for his medications so was unable to take medications for last 1-1/2 years. No previous history of stroke or seizures Patient's father's family has history coronary disease   Review of systems:    In addition to the HPI above,    All other systems reviewed and are negative.    Past History of the following :    Past Medical History:  Diagnosis Date   Blood transfusion without reported diagnosis    Hypertension       History reviewed. No pertinent surgical history.    Social History:      Social History   Tobacco Use   Smoking status: Every Day    Packs/day: .5    Types: Cigarettes   Smokeless tobacco: Never  Substance Use Topics   Alcohol use: Not Currently       Family History :     Family History  Problem Relation Age of Onset   Hypertension Mother    Patient's mother diagnosed with  breast cancer   Home Medications:   Prior to Admission medications   Medication Sig Start Date End Date Taking? Authorizing Provider  albuterol (VENTOLIN HFA) 108 (90 Base) MCG/ACT inhaler Inhale 2 puffs into the lungs every 6 (six) hours as needed for wheezing or shortness of breath. Patient not taking: Reported on 04/19/2023 11/11/19   Vassie Loll, MD  amLODipine (NORVASC) 10 MG tablet Take 1 tablet (10 mg total) by mouth daily. Patient not taking: Reported on 04/19/2023 11/12/19   Vassie Loll, MD  doxycycline (VIBRA-TABS) 100 MG tablet Take 1 tablet (100 mg total) by mouth 2 (two) times daily. Patient not taking: Reported on 04/19/2023 11/11/19   Vassie Loll, MD  Fluticasone-Salmeterol (ADVAIR DISKUS) 250-50 MCG/DOSE AEPB Inhale 1 puff into the lungs 2 (two) times daily. Patient not taking: Reported on 04/19/2023 11/11/19 02/09/20  Vassie Loll, MD  predniSONE (DELTASONE) 20 MG tablet 3 tablets by mouth daily x1 day; then 2 tablets by mouth daily x2 days; then  1 tablet by mouth daily x3 days; day and half dollar by mouth daily x3 days stop prednisone. Patient not taking: Reported on 04/19/2023 11/11/19   Vassie Loll, MD  spironolactone-hydrochlorothiazide (ALDACTAZIDE) 25-25 MG tablet Take 1 tablet by mouth daily. Patient not taking: Reported on 04/19/2023 11/11/19   Vassie Loll, MD     Allergies:    No Known Allergies   Physical Exam:   Vitals  Blood pressure (!) 170/118, pulse 80, temperature 97.7 F (36.5 C), temperature source Oral, resp. rate 19, height  (1.778 m), weight 79.4 kg, SpO2 98 %.  1.  General: Appears in no acute distress  2. Psychiatric: Alert, oriented x 3, intact insight and judgment  3. Neurologic: Cranial nerves II through XII grossly intact, no focal deficit noted  4. HEENMT:  Atraumatic normocephalic, extraocular muscles are intact  5. Respiratory : Clear to auscultation bilaterally, no wheezing or crackles auscultated  6.  Cardiovascular : S1-S2, regular, no murmur auscultated  7. Gastrointestinal:  Abdomen is soft, nontender, no organomegaly  No edema in the lower extremities   Data Review:    CBC Recent Labs  Lab 04/19/23 0741  WBC 8.8  HGB 15.4  HCT 47.9  PLT 158  MCV 98.2  MCH 31.6  MCHC 32.2  RDW 12.6  LYMPHSABS 1.4  MONOABS 0.5  EOSABS 0.3  BASOSABS 0.1   ------------------------------------------------------------------------------------------------------------------  Results for orders placed or performed during the hospital encounter of 04/19/23 (from the past 48 hour(s))  Comprehensive metabolic panel     Status: Abnormal   Collection Time: 04/19/23  7:41 AM  Result Value Ref Range   Sodium 140 135 - 145 mmol/L   Potassium 3.9 3.5 - 5.1 mmol/L   Chloride 107 98 - 111 mmol/L   CO2 23 22 - 32 mmol/L   Glucose, Bld 111 (H) 70 - 99 mg/dL    Comment: Glucose reference range applies only to samples taken after fasting for at least 8 hours.   BUN 19 6 - 20 mg/dL   Creatinine, Ser 4.54 0.61 - 1.24 mg/dL   Calcium 9.1 8.9 - 09.8 mg/dL   Total Protein 7.5 6.5 - 8.1 g/dL   Albumin 4.4 3.5 - 5.0 g/dL   AST 28 15 - 41 U/L   ALT 20 0 - 44 U/L   Alkaline Phosphatase 58 38 - 126 U/L   Total Bilirubin 0.5 0.3 - 1.2 mg/dL   GFR, Estimated >11 >91 mL/min    Comment: (NOTE) Calculated using the CKD-EPI Creatinine Equation (2021)    Anion gap 10 5 - 15    Comment: Performed at [facility], [address] [place], [place] [address]  Brain natriuretic peptide     Status: Abnormal   Collection Time: 04/19/23  7:41 AM  Result Value Ref Range   B Natriuretic Peptide 495.0 (H) 0.0 - 100.0 pg/mL    Comment: Performed at [facility], [address] [place], [place] [address]  Troponin I (High Sensitivity)     Status: Abnormal   Collection Time: 04/19/23  7:41 AM  Result Value Ref Range   Troponin I (High Sensitivity) 78 (H) <18 ng/L    Comment: (NOTE) Elevated high sensitivity troponin I  (hsTnI) values and significant  changes across serial measurements may suggest ACS but many other  chronic and acute conditions are known to elevate hsTnI results.  Refer to the "Links" section for chest pain algorithms and additional  guidance. Performed at [facility], [address] [place], [place] [address]   CBC with Differential     Status: None   Collection Time: 04/19/23  7:41 AM  Result Value Ref Range   WBC 8.8 4.0 - 10.5 K/uL   RBC 4.88 4.22 - 5.81 MIL/uL   Hemoglobin 15.4 13.0 - 17.0 g/dL   HCT 60.4 54.0 - 98.1 %   MCV 98.2 80.0 - 100.0 fL   MCH 31.6 26.0 - 34.0 pg   MCHC 32.2 30.0 - 36.0 g/dL   RDW 19.1 47.8 - 29.5 %   Platelets 158 150 - 400 K/uL   nRBC 0.0 0.0 - 0.2 %   Neutrophils Relative % 74 %   Neutro Abs 6.5 1.7 - 7.7 K/uL   Lymphocytes Relative 16 %   Lymphs Abs 1.4 0.7 - 4.0 K/uL   Monocytes Relative 6 %   Monocytes Absolute 0.5 0.1 - 1.0 K/uL   Eosinophils Relative 3 %   Eosinophils Absolute 0.3 0.0 - 0.5 K/uL   Basophils Relative 1 %   Basophils Absolute 0.1 0.0 - 0.1 K/uL   Immature Granulocytes 0 %   Abs Immature Granulocytes 0.02 0.00 - 0.07 K/uL    Comment: Performed at [facility], [address] [place], [place] [address]  Lipase, blood     Status: None   Collection Time: 04/19/23  7:41 AM  Result Value Ref Range   Lipase 35 11 - 51 U/L    Comment: Performed at [facility], [address] [place], [place] [address]  Urinalysis, Routine w reflex microscopic -Urine, Clean Catch     Status: Abnormal   Collection Time: 04/19/23  8:17 AM  Result Value Ref Range   Color, Urine STRAW (A) YELLOW   APPearance CLEAR CLEAR   Specific Gravity, Urine 1.010 1.005 - 1.030   pH 7.0 5.0 - 8.0   Glucose, UA NEGATIVE NEGATIVE mg/dL   Hgb urine dipstick NEGATIVE NEGATIVE   Bilirubin Urine NEGATIVE NEGATIVE   Ketones, ur NEGATIVE NEGATIVE mg/dL   Protein, ur NEGATIVE NEGATIVE mg/dL   Nitrite NEGATIVE NEGATIVE   Leukocytes,Ua NEGATIVE NEGATIVE     Comment: Performed at [facility], [address], [place], [place] [address]  Troponin I (High Sensitivity)     Status: Abnormal   Collection Time: 04/19/23  9:48 AM  Result Value Ref Range   Troponin I (High Sensitivity) 102 (HH) <18 ng/L    Comment: CRITICAL RESULT CALLED TO, READ BACK BY AND VERIFIED WITH C GROFE AT 1046 ON 04/19/2023 BY S DALTON DELTA CHECK NOTED (NOTE) Elevated high sensitivity troponin I (hsTnI) values and significant  changes across serial measurements may suggest ACS but many other  chronic and acute conditions are known to elevate hsTnI results.  Refer to the "Links" section for chest pain algorithms and additional  guidance. Performed at [facility], [address] [place], [place] [address]     Chemistries  Recent Labs  Lab 04/19/23 0741  NA 140  K 3.9  CL 107  CO2 23  GLUCOSE 111*  BUN 19  CREATININE 1.08  CALCIUM 9.1  AST 28  ALT 20  ALKPHOS 58  BILITOT 0.5   ------------------------------------------------------------------------------------------------------------------  ------------------------------------------------------------------------------------------------------------------ GFR: Estimated Creatinine Clearance: 90.1 mL/min (by C-G formula based on SCr of 1.08 mg/dL). Liver Function Tests: Recent Labs  Lab 04/19/23 0741  AST 28  ALT 20  ALKPHOS 58  BILITOT 0.5  PROT 7.5  ALBUMIN 4.4   Recent Labs  Lab 04/19/23 0741  LIPASE 35  No results for input(s): "AMMONIA" in the last 168 hours. Coagulation Profile: No results for input(s): "INR", "PROTIME" in the last 168 hours. Cardiac Enzymes: No results for input(s): "CKTOTAL", "CKMB", "CKMBINDEX", "TROPONINI" in the last 168 hours. BNP (last 3 results) No results for input(s): "PROBNP" in the last 8760 hours. HbA1C: No results for input(s): "HGBA1C" in the last 72 hours. CBG: No results for input(s): "GLUCAP" in the last 168 hours. Lipid Profile: No results for  input(s): "CHOL", "HDL", "LDLCALC", "TRIG", "CHOLHDL", "LDLDIRECT" in the last 72 hours. Thyroid Function Tests: No results for input(s): "TSH", "T4TOTAL", "FREET4", "T3FREE", "THYROIDAB" in the last 72 hours. Anemia Panel: No results for input(s): "VITAMINB12", "FOLATE", "FERRITIN", "TIBC", "IRON", "RETICCTPCT" in the last 72 hours.  --------------------------------------------------------------------------------------------------------------- Urine analysis:    Component Value Date/Time   COLORURINE STRAW (A) 04/19/2023 0817   APPEARANCEUR CLEAR 04/19/2023 0817   LABSPEC 1.010 04/19/2023 0817   PHURINE 7.0 04/19/2023 0817   GLUCOSEU NEGATIVE 04/19/2023 0817   HGBUR NEGATIVE 04/19/2023 0817   BILIRUBINUR NEGATIVE 04/19/2023 0817   KETONESUR NEGATIVE 04/19/2023 0817   PROTEINUR NEGATIVE 04/19/2023 0817   NITRITE NEGATIVE 04/19/2023 0817   LEUKOCYTESUR NEGATIVE 04/19/2023 0817      Imaging Results:    CT Angio Chest/Abd/Pel for Dissection W and/or W/WO  Result Date: 04/19/2023 CLINICAL DATA:  Chest pain/tightness and shortness of breath. EXAM: CT ANGIOGRAPHY CHEST, ABDOMEN AND PELVIS TECHNIQUE: Multidetector CT imaging through the chest, abdomen and pelvis was performed using the standard protocol during bolus administration of intravenous contrast. Multiplanar reconstructed images and MIPs were obtained and reviewed to evaluate the vascular anatomy. RADIATION DOSE REDUCTION: This exam was performed according to the departmental dose-optimization program which includes automated exposure control, adjustment of the mA and/or kV according to patient size and/or use of iterative reconstruction technique. CONTRAST:  OMNIPAQUE IOHEXOL 350 MG/ML SOLN COMPARISON:  Same day chest radiograph FINDINGS: CTA CHEST FINDINGS Cardiovascular: The ascending thoracic aorta is mildly aneurysmal measuring up to 4.1 cm. There is no evidence of dissection. Age advanced coronary artery calcifications are  noted. The heart size is normal. There is no pericardial effusion. Mediastinum/Nodes: The thyroid is unremarkable. The esophagus is grossly unremarkable. There is no mediastinal, hilar, or axillary lymphadenopathy. Lungs/Pleura: The trachea and central airways are patent with central bronchial wall thickening. There is diffuse interlobular septal thickening in both lungs. There are patchy ground-glass opacities in the right upper and lower lobes (7-62, 7-76, 7-99). There is a trace right pleural effusion. There is no pneumothorax. Musculoskeletal: There is no acute osseous abnormality or suspicious osseous lesion. Review of the MIP images confirms the above findings. CTA ABDOMEN AND PELVIS FINDINGS VASCULAR Aorta: Normal caliber aorta with mild calcified atherosclerotic plaque but no aneurysm, dissection, vasculitis or significant stenosis. Celiac: There is mild plaque of the origin and severe stenosis of the splenic artery (5-106). There is no evidence of aneurysm, dissection, or vasculitis. SMA: Patent without evidence of aneurysm, dissection, vasculitis or significant stenosis. Renals: Both renal arteries are patent without evidence of aneurysm, dissection, vasculitis, fibromuscular dysplasia or significant stenosis. IMA: Patent without evidence of aneurysm, dissection, vasculitis or significant stenosis. Inflow: Patent without evidence of aneurysm, dissection, vasculitis or significant stenosis. Veins: No obvious venous abnormality within the limitations of this arterial phase study. Review of the MIP images confirms the above findings. NON-VASCULAR Hepatobiliary: The liver and gallbladder are unremarkable. There is no biliary ductal dilatation. Pancreas: Unremarkable. Spleen: Unremarkable. Adrenals/Urinary Tract: The adrenals are unremarkable. The kidneys are unremarkable,  with no focal lesion, stone, hydronephrosis, or hydroureter. The bladder is unremarkable. Stomach/Bowel: The stomach is unremarkable. There  is no evidence of bowel obstruction. There is no abnormal bowel wall thickening or inflammatory change. There is colonic diverticulosis without evidence of acute diverticulitis. The appendix is normal. Lymphatic: There is no abdominal or pelvic lymphadenopathy. Reproductive: The prostate and seminal vesicles are unremarkable. Other: There is no ascites or free air. Musculoskeletal: There is no acute osseous abnormality or suspicious osseous lesion. Review of the MIP images confirms the above findings. IMPRESSION: 1. No evidence of acute aortic syndrome. 2. 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 Recommend annual imaging followup by CTA or MRA. 3. 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 4. Interlobular septal thickening in both lungs with a small right pleural effusion and patchy ground-glass opacities in the right upper and lower lobes most in keeping with pulmonary interstitial edema and superimposed infection or inflammation. Recommend follow-up chest CT in 3 months to assess for resolution. 5. No acute findings in the abdomen or pelvis. 6. Diverticulosis without evidence of acute diverticulitis. Electronically Signed   By: Lesia Hausen M.D.   On: 04/19/2023 09:40   DG Chest Port 1 View  Result Date: 04/19/2023 CLINICAL DATA:  Chest pain for 3 days, cough EXAM: PORTABLE CHEST 1 VIEW COMPARISON:  Chest radiograph 11/09/2019 FINDINGS: The heart is at the upper limits of normal for size. The upper mediastinal contours are normal. There are diffusely increased and coarsened interstitial markings throughout both lungs which appears significantly worsened since 2020. There is more focal ill-defined airspace opacity in the right upper lobe in the suprahilar region. There is no pulmonary edema. There is no pleural effusion or pneumothorax There is  no acute osseous abnormality. IMPRESSION: Diffusely increased and coarsened interstitial markings throughout both lungs appears significantly worsened since 2020 and may reflect worsened chronic lung disease, with possible superimposed acute airspace opacity in the right suprahilar region. Consider CT chest for better characterization. Electronically Signed   By: Lesia Hausen M.D.   On: 04/19/2023 08:02    My personal review of EKG: Normal sinus rhythm, findings of LVH, right atrial enlargement   Assessment & Plan:    Principal Problem:   Hypertensive emergency   Hypertensive urgency-in setting of noncompliance with medications.  Presented with dyspnea on exertion, found to have elevated BNP, troponin elevated.  Chest pain has resolved.  Cardiology consulted.  Did not recommend nitroglycerin.  Started on amlodipine, Aldactone and well dose of Lasix 40 mg IV.  Will start hydralazine 25 mg p.o. every 6 hours as needed for BP greater than 160/100. Dyspnea on exertion/chest pain-likely in setting of acute diastolic CHF.  Echocardiogram has been ordered per cardiology.  BNP elevated at 495.0.  Troponin elevated at 78, 102.  Management per cardiology. Dilation of ascending aorta-mild aneurysmal dilatation of the aorta seen on CTA chest, measuring 4.1 cm.  Recommend repeat CTA chest in 3 months. Bilateral patchy/groundglass opacities-seen on CT chest, likely in setting of pulmonary edema.  However recommend repeat CT chest in 3 months.  Given Lasix 40 mg IV x 1 in the ED.  Currently not requiring oxygen.    DVT Prophylaxis-   Lovenox   AM Labs Ordered, also please review Full Orders  Family Communication: Admission, patients condition and plan of care including tests being ordered have  been discussed with the patient who indicate understanding and agree with the plan and Code Status.  Code Status: Full code  Admission status: Inpatient :The appropriate admission status for this patient is  INPATIENT. Inpatient status is judged to be reasonable and necessary in order to provide the required intensity of service to ensure the patient's safety. The patient's presenting symptoms, physical exam findings, and initial radiographic and laboratory data in the context of their chronic comorbidities is felt to place them at high risk for further clinical deterioration. Furthermore, it is not anticipated that the patient will be medically stable for discharge from the hospital within 2 midnights of admission. The following factors support the admission status of inpatient.    Time spent in minutes : 60 min   Rehaan Viloria S Aman Bonet M.D

## 2023-04-19 NOTE — ED Provider Notes (Signed)
[place] EMERGENCY DEPARTMENT AT [facility] Provider Note   CSN: [id number] Arrival date & time: 04/19/23  4098     History  Chief Complaint  Patient presents with   Chest Pain    Caleb Perkins is a 47 y.o. male.  He has a history of hypertension but has not taken his medicine in a few years due to lack of insurance.  He is here with a complaint of feeling hot and cold, diaphoresis, tightness in his chest, nausea vomiting, shortness of breath, fatigue has been going on and off for few days.  He said he sometimes feels like this when his blood pressure goes up.  He denies any headache but states he feels lightheaded sometimes when he takes a shower.  No numbness or weakness no blurry vision double vision.  He endorses tobacco and marijuana, denies any alcohol use or other drugs.  He  The history is provided by the patient.  Chest Pain Pain location:  Substernal area Pain quality: tightness   Pain radiates to:  Does not radiate Timing:  Constant Progression:  Unchanged Chronicity:  Recurrent Relieved by:  None tried Worsened by:  Nothing Ineffective treatments:  None tried Associated symptoms: cough, diaphoresis, dizziness, nausea, near-syncope, shortness of breath and vomiting   Associated symptoms: no abdominal pain   Risk factors: hypertension and smoking        Home Medications Prior to Admission medications   Medication Sig Start Date End Date Taking? Authorizing Provider  albuterol (VENTOLIN HFA) 108 (90 Base) MCG/ACT inhaler Inhale 2 puffs into the lungs every 6 (six) hours as needed for wheezing or shortness of breath. 11/11/19   Vassie Loll, MD  amLODipine (NORVASC) 10 MG tablet Take 1 tablet (10 mg total) by mouth daily. 11/12/19   Vassie Loll, MD  doxycycline (VIBRA-TABS) 100 MG tablet Take 1 tablet (100 mg total) by mouth 2 (two) times daily. 11/11/19   Vassie Loll, MD  Fluticasone-Salmeterol (ADVAIR DISKUS) 250-50 MCG/DOSE AEPB Inhale 1 puff  into the lungs 2 (two) times daily. 11/11/19 02/09/20  Vassie Loll, MD  predniSONE (DELTASONE) 20 MG tablet 3 tablets by mouth daily x1 day; then 2 tablets by mouth daily x2 days; then 1 tablet by mouth daily x3 days; day and half dollar by mouth daily x3 days stop prednisone. 11/11/19   Vassie Loll, MD  spironolactone-hydrochlorothiazide (ALDACTAZIDE) 25-25 MG tablet Take 1 tablet by mouth daily. 11/11/19   Vassie Loll, MD      Allergies    Patient has no known allergies.    Review of Systems   Review of Systems  Constitutional:  Positive for diaphoresis.  Eyes:  Negative for visual disturbance.  Respiratory:  Positive for cough and shortness of breath.   Cardiovascular:  Positive for chest pain and near-syncope.  Gastrointestinal:  Positive for nausea and vomiting. Negative for abdominal pain.  Genitourinary:  Negative for dysuria.  Neurological:  Positive for dizziness.    Physical Exam Updated Vital Signs BP (!) 228/156   Pulse 95   Temp 97.7 F (36.5 C) (Oral)   Resp 20   Ht 5\' 10"  (1.778 m)   Wt 79.4 kg   SpO2 99%   BMI 25.11 kg/m  Physical Exam Vitals and nursing note reviewed.  Constitutional:      General: He is not in acute distress.    Appearance: Normal appearance. He is well-developed.  HENT:     Head: Normocephalic and atraumatic.  Eyes:  Conjunctiva/sclera: Conjunctivae normal.  Cardiovascular:     Rate and Rhythm: Normal rate and regular rhythm.     Heart sounds: Normal heart sounds. No murmur heard. Pulmonary:     Effort: Pulmonary effort is normal. No respiratory distress.     Breath sounds: Normal breath sounds.  Abdominal:     Palpations: Abdomen is soft.     Tenderness: There is no abdominal tenderness. There is no guarding or rebound.  Musculoskeletal:        General: No deformity. Normal range of motion.     Cervical back: Neck supple.     Right lower leg: No tenderness. No edema.     Left lower leg: No tenderness. No edema.   Skin:    General: Skin is warm and dry.     Capillary Refill: Capillary refill takes less than 2 seconds.  Neurological:     General: No focal deficit present.     Mental Status: He is alert.     Sensory: No sensory deficit.     Motor: No weakness.     Gait: Gait normal.     ED Results / Procedures / Treatments   Labs (all labs ordered are listed, but only abnormal results are displayed) Labs Reviewed  COMPREHENSIVE METABOLIC PANEL - Abnormal; Notable for the following components:      Result Value   Glucose, Bld 111 (*)    All other components within normal limits  BRAIN NATRIURETIC PEPTIDE - Abnormal; Notable for the following components:   B Natriuretic Peptide 495.0 (*)    All other components within normal limits  URINALYSIS, ROUTINE W REFLEX MICROSCOPIC - Abnormal; Notable for the following components:   Color, Urine STRAW (*)    All other components within normal limits  RAPID URINE DRUG SCREEN, HOSP PERFORMED - Abnormal; Notable for the following components:   Tetrahydrocannabinol POSITIVE (*)    All other components within normal limits  TROPONIN I (HIGH SENSITIVITY) - Abnormal; Notable for the following components:   Troponin I (High Sensitivity) 78 (*)    All other components within normal limits  TROPONIN I (HIGH SENSITIVITY) - Abnormal; Notable for the following components:   Troponin I (High Sensitivity) 102 (*)    All other components within normal limits  MRSA NEXT GEN BY PCR, NASAL  CBC WITH DIFFERENTIAL/PLATELET  LIPASE, BLOOD  HIV ANTIBODY (ROUTINE TESTING W REFLEX)  LIPID PANEL  HEMOGLOBIN A1C  MAGNESIUM  TSH  CBC  COMPREHENSIVE METABOLIC PANEL    EKG EKG Interpretation  Date/Time:  Friday April 19 2023 07:24:06 EDT Ventricular Rate:  92 PR Interval:  126 QRS Duration: 114 QT Interval:  362 QTC Calculation: 447 R Axis:   79 Text Interpretation: Normal sinus rhythm Right atrial enlargement Left ventricular hypertrophy with repolarization  abnormality ( Sokolow-Lyon , Romhilt-Estes ) Abnormal ECG When compared with ECG of 09-Nov-2019 18:00, No significant change since last tracing Confirmed by Meridee Score [phone]) on 04/19/2023 7:29:07 AM Also confirmed by Meridee Score [phone]), editor Erenest Rasher ([id number])  on 04/19/2023 7:29:58 AM  Radiology ECHOCARDIOGRAM COMPLETE  Result Date: 04/19/2023    ECHOCARDIOGRAM REPORT   Patient Name:   Caleb Perkins Date of Exam: 04/19/2023 Medical Rec #:  [id number]       Height:       70.0 in Accession #:    [id number]      Weight:       175.0 lb Date of Birth:  1976/07/26  BSA:          1.972 m Patient Age:    46 years        BP:           202/145 mmHg Patient Gender: M               HR:           73 bpm. [facility] Procedure: 2D Echo, Cardiac Doppler and Color Doppler Indications:    Chest pain  History:        Patient has no prior history of Echocardiogram examinations.                 Risk Factors:Hypertension.  Sonographer:    Milda Smart Referring Phys: [id number] Lennart Pall STRADER IMPRESSIONS  1. Left ventricular ejection fraction, by estimation, is 35 to 40%. The left ventricle has moderately decreased function. The left ventricle demonstrates global hypokinesis. There is moderate left ventricular hypertrophy. Left ventricular diastolic parameters are consistent with Grade II diastolic dysfunction (pseudonormalization). Elevated left atrial pressure.  2. Right ventricular systolic function is normal. The right ventricular size is normal. There is severely elevated pulmonary artery systolic pressure.  3. Left atrial size was mildly dilated.  4. Right atrial size was mildly dilated.  5. The mitral valve is abnormal. Mild mitral valve regurgitation. No evidence of mitral stenosis.  6. The tricuspid valve is abnormal.  7. The aortic valve is tricuspid. Aortic valve regurgitation is mild. No aortic stenosis is present.  8. Aortic dilatation noted. There is mild dilatation of the  ascending aorta, measuring 40 mm.  9. The inferior vena cava is dilated in size with <50% respiratory variability, suggesting right atrial pressure of 15 mmHg. FINDINGS  Left Ventricle: Left ventricular ejection fraction, by estimation, is 35 to 40%. The left ventricle has moderately decreased function. The left ventricle demonstrates global hypokinesis. The left ventricular internal cavity size was normal in size. There is moderate left ventricular hypertrophy. Left ventricular diastolic parameters are consistent with Grade II diastolic dysfunction (pseudonormalization). Elevated left atrial pressure. Right Ventricle: The right ventricular size is normal. Right vetricular wall thickness was not well visualized. Right ventricular systolic function is normal. There is severely elevated pulmonary artery systolic pressure. The tricuspid regurgitant velocity is 3.72 m/s, and with an assumed right atrial pressure of 15 mmHg, the estimated right ventricular systolic pressure is 70.4 mmHg. Left Atrium: Left atrial size was mildly dilated. Right Atrium: Right atrial size was mildly dilated. Pericardium: There is no evidence of pericardial effusion. Mitral Valve: The mitral valve is abnormal. Mild mitral valve regurgitation. No evidence of mitral valve stenosis. Tricuspid Valve: The tricuspid valve is abnormal. Tricuspid valve regurgitation is mild . No evidence of tricuspid stenosis. Aortic Valve: The aortic valve is tricuspid. Aortic valve regurgitation is mild. No aortic stenosis is present. Aortic valve mean gradient measures 1.4 mmHg. Aortic valve peak gradient measures 3.1 mmHg. Aortic valve area, by VTI measures 5.54 cm. Pulmonic Valve: The pulmonic valve was not well visualized. Pulmonic valve regurgitation is not visualized. No evidence of pulmonic stenosis. Aorta: The aortic root is normal in size and structure and aortic dilatation noted. There is mild dilatation of the ascending aorta, measuring 40 mm. Venous:  The inferior vena cava is dilated in size with less than 50% respiratory variability, suggesting right atrial pressure of 15 mmHg. IAS/Shunts: No atrial level shunt detected by color flow Doppler.  LEFT VENTRICLE PLAX 2D LVIDd:  5.10 cm      Diastology LVIDs:         4.50 cm      LV e' medial:    4.95 cm/s LV PW:         1.30 cm      LV E/e' medial:  16.4 LV IVS:        1.30 cm      LV e' lateral:   5.98 cm/s LVOT diam:     2.40 cm      LV E/e' lateral: 13.6 LV SV:         71 LV SV Index:   36 LVOT Area:     4.52 cm  LV Volumes (MOD) LV vol d, MOD A2C: 147.0 ml LV vol d, MOD A4C: 128.0 ml LV vol s, MOD A2C: 80.8 ml LV vol s, MOD A4C: 67.0 ml LV SV MOD A2C:     66.2 ml LV SV MOD A4C:     128.0 ml LV SV MOD BP:      61.7 ml RIGHT VENTRICLE            IVC RV S prime:     8.36 cm/s  IVC diam: 2.30 cm TAPSE (M-mode): 1.6 cm LEFT ATRIUM             Index        RIGHT ATRIUM           Index LA diam:        4.40 cm 2.23 cm/m   RA Area:     24.10 cm LA Vol (A2C):   77.5 ml 39.30 ml/m  RA Volume:   80.50 ml  40.82 ml/m LA Vol (A4C):   71.7 ml 36.36 ml/m LA Biplane Vol: 76.9 ml 38.99 ml/m  AORTIC VALVE AV Area (Vmax):    4.04 cm AV Area (Vmean):   4.64 cm AV Area (VTI):     5.54 cm AV Vmax:           88.23 cm/s AV Vmean:          54.788 cm/s AV VTI:            0.127 m AV Peak Grad:      3.1 mmHg AV Mean Grad:      1.4 mmHg LVOT Vmax:         78.80 cm/s LVOT Vmean:        56.200 cm/s LVOT VTI:          0.156 m LVOT/AV VTI ratio: 1.23  AORTA Ao Root diam: 3.90 cm Ao Asc diam:  4.00 cm MITRAL VALVE               TRICUSPID VALVE MV Area (PHT): 4.31 cm    TR Peak grad:   55.4 mmHg MV Decel Time: 176 msec    TR Mean grad:   36.0 mmHg MV E velocity: 81.40 cm/s  TR Vmax:        372.00 cm/s                            TR Vmean:       285.0 cm/s                             SHUNTS  Systemic VTI:  0.16 m                            Systemic Diam: 2.40 cm Dina Rich MD Electronically signed by  Dina Rich MD Signature Date/Time: 04/19/2023/2:03:34 PM    Final    CT Angio Chest/Abd/Pel for Dissection W and/or W/WO  Result Date: 04/19/2023 CLINICAL DATA:  Chest pain/tightness and shortness of breath. EXAM: CT ANGIOGRAPHY CHEST, ABDOMEN AND PELVIS TECHNIQUE: Multidetector CT imaging through the chest, abdomen and pelvis was performed using the standard protocol during bolus administration of intravenous contrast. Multiplanar reconstructed images and MIPs were obtained and reviewed to evaluate the vascular anatomy. RADIATION DOSE REDUCTION: This exam was performed according to the departmental dose-optimization program which includes automated exposure control, adjustment of the mA and/or kV according to patient size and/or use of iterative reconstruction technique. CONTRAST:  OMNIPAQUE IOHEXOL 350 MG/ML SOLN COMPARISON:  Same day chest radiograph FINDINGS: CTA CHEST FINDINGS Cardiovascular: The ascending thoracic aorta is mildly aneurysmal measuring up to 4.1 cm. There is no evidence of dissection. Age advanced coronary artery calcifications are noted. The heart size is normal. There is no pericardial effusion. Mediastinum/Nodes: The thyroid is unremarkable. The esophagus is grossly unremarkable. There is no mediastinal, hilar, or axillary lymphadenopathy. Lungs/Pleura: The trachea and central airways are patent with central bronchial wall thickening. There is diffuse interlobular septal thickening in both lungs. There are patchy ground-glass opacities in the right upper and lower lobes (7-62, 7-76, 7-99). There is a trace right pleural effusion. There is no pneumothorax. Musculoskeletal: There is no acute osseous abnormality or suspicious osseous lesion. Review of the MIP images confirms the above findings. CTA ABDOMEN AND PELVIS FINDINGS VASCULAR Aorta: Normal caliber aorta with mild calcified atherosclerotic plaque but no aneurysm, dissection, vasculitis or significant stenosis. 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 There is no evidence of aneurysm, dissection, or vasculitis. SMA: Patent without evidence of aneurysm, dissection, vasculitis or significant stenosis. Renals: Both renal arteries are patent without evidence of aneurysm, dissection, vasculitis, fibromuscular dysplasia or significant stenosis. IMA: Patent without evidence of aneurysm, dissection, vasculitis or significant stenosis. Inflow: Patent without evidence of aneurysm, dissection, vasculitis or significant stenosis. Veins: No obvious venous abnormality within the limitations of this arterial phase study. Review of the MIP images confirms the above findings. NON-VASCULAR Hepatobiliary: The liver and gallbladder are unremarkable. There is no biliary ductal dilatation. Pancreas: Unremarkable. Spleen: Unremarkable. Adrenals/Urinary Tract: The adrenals are unremarkable. The kidneys are unremarkable, with no focal lesion, stone, hydronephrosis, or hydroureter. The bladder is unremarkable. Stomach/Bowel: The stomach is unremarkable. There is no evidence of bowel obstruction. There is no abnormal bowel wall thickening or inflammatory change. There is colonic diverticulosis without evidence of acute diverticulitis. The appendix is normal. Lymphatic: There is no abdominal or pelvic lymphadenopathy. Reproductive: The prostate and seminal vesicles are unremarkable. Other: There is no ascites or free air. Musculoskeletal: There is no acute osseous abnormality or suspicious osseous lesion. Review of the MIP images confirms the above findings. IMPRESSION: 1. No evidence of acute aortic syndrome. 2. 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 Recommend annual imaging followup by CTA or MRA. 3. 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  4. Interlobular septal thickening in both lungs with  a small right pleural effusion and patchy ground-glass opacities in the right upper and lower lobes most in keeping with pulmonary interstitial edema and superimposed infection or inflammation. Recommend follow-up chest CT in 3 months to assess for resolution. 5. No acute findings in the abdomen or pelvis. 6. Diverticulosis without evidence of acute diverticulitis. Electronically Signed   By: Lesia Hausen M.D.   On: 04/19/2023 09:40   DG Chest Port 1 View  Result Date: 04/19/2023 CLINICAL DATA:  Chest pain for 3 days, cough EXAM: PORTABLE CHEST 1 VIEW COMPARISON:  Chest radiograph 11/09/2019 FINDINGS: The heart is at the upper limits of normal for size. The upper mediastinal contours are normal. There are diffusely increased and coarsened interstitial markings throughout both lungs which appears significantly worsened since 2020. There is more focal ill-defined airspace opacity in the right upper lobe in the suprahilar region. There is no pulmonary edema. There is no pleural effusion or pneumothorax There is no acute osseous abnormality. IMPRESSION: Diffusely increased and coarsened interstitial markings throughout both lungs appears significantly worsened since 2020 and may reflect worsened chronic lung disease, with possible superimposed acute airspace opacity in the right suprahilar region. Consider CT chest for better characterization. Electronically Signed   By: Lesia Hausen M.D.   On: 04/19/2023 08:02    Procedures .Critical Care  Performed by: Terrilee Files, MD Authorized by: Terrilee Files, MD   Critical care provider statement:    Critical care time (minutes):  45   Critical care time was exclusive of:  Separately billable procedures and treating other patients   Critical care was necessary to treat or prevent imminent or life-threatening deterioration of the following conditions:  Circulatory failure, cardiac failure and respiratory  failure   Critical care was time spent personally by me on the following activities:  Development of treatment plan with patient or surrogate, discussions with consultants, evaluation of patient's response to treatment, examination of patient, obtaining history from patient or surrogate, ordering and performing treatments and interventions, ordering and review of laboratory studies, ordering and review of radiographic studies, pulse oximetry and re-evaluation of patient's condition   I assumed direction of critical care for this patient from another provider in my specialty: no       Medications Ordered in ED Medications  amLODipine (NORVASC) tablet 10 mg (10 mg Oral Given 04/19/23 1114)  spironolactone (ALDACTONE) tablet 25 mg (25 mg Oral Given 04/19/23 1115)  aspirin EC tablet 81 mg (81 mg Oral Given 04/19/23 1114)  atorvastatin (LIPITOR) tablet 40 mg (40 mg Oral Given 04/19/23 1114)  enoxaparin (LOVENOX) injection 40 mg (has no administration in time range)  ondansetron (ZOFRAN) tablet 4 mg (has no administration in time range)    Or  ondansetron (ZOFRAN) injection 4 mg (has no administration in time range)  acetaminophen (TYLENOL) tablet 650 mg (has no administration in time range)    Or  acetaminophen (TYLENOL) suppository 650 mg (has no administration in time range)  hydrALAZINE (APRESOLINE) tablet 25 mg (25 mg Oral Given 04/19/23 1342)  carvedilol (COREG) tablet 6.25 mg (6.25 mg Oral Given 04/19/23 1507)  Chlorhexidine Gluconate Cloth 2 % PADS 6 each (6 each Topical Given 04/19/23 1615)  labetalol (NORMODYNE) injection 20 mg (20 mg Intravenous Given 04/19/23 0746)  iohexol (OMNIPAQUE) 350 MG/ML injection 100 mL (100 mLs Intravenous Contrast Given 04/19/23 0905)  furosemide (LASIX) injection 40 mg (40 mg Intravenous Given 04/19/23 1114)    ED Course/ Medical Decision Making/ A&P Clinical Course as of  04/19/23 1726  Fri Apr 19, 2023  0807 Chest x-ray showing increased interstitial markings.   No definite infiltrate.  Awaiting radiology reading. [MB]  1007 Discussed with Dr. Wyline Mood cardiology.  He felt the patient was appropriate to stay at [facility] and they would see in consult.  He said he would likely treat this as hypertensive urgency and work on blood pressure control.  Does not feel heparin indicated at this time although if troponin rising to talk with him again.  Cardiology will see in consult. [MB]  1040 Discussed with Dr. Sharl Ma [facility] hospitalist who will evaluate patient for admission.  I did update patient and he is agreeable to admission. [MB]    Clinical Course User Index [MB] Terrilee Files, MD                             Medical Decision Making Amount and/or Complexity of Data Reviewed Labs: ordered. Radiology: ordered.  Risk Prescription drug management. Decision regarding hospitalization.   This patient complains of chest pain shortness of breath elevated blood pressure; this involves an extensive number of treatment Options and is a complaint that carries with it a high risk of complications and morbidity. The differential includes hypertensive emergency, hypertensive urgency, dissection, CHF, cardiomyopathy, ACS  I ordered, reviewed and interpreted labs, which included CBC normal chemistries and LFTs normal, BNP elevated, troponins elevated and rising I ordered medication IV labetalol for blood pressure control and reviewed PMP when indicated. I ordered imaging studies which included chest x-ray, CT angio and I independently    visualized and interpreted imaging which showed ascending aortic aneurysm none dissected, increased groundglass opacities Previous records obtained and reviewed in epic no recent admissions I consulted Dr. Wyline Mood cardiology and Dr. Sharl Ma [facility] hospitalist and discussed lab and imaging findings and discussed disposition.  Cardiac monitoring reviewed, normal sinus rhythm Social determinants considered, ongoing tobacco use Critical  Interventions: Initiation of IV medications for hypertensive emergency  After the interventions stated above, I reevaluated the patient and found patient's blood pressure to be trending down although still elevated Admission and further testing considered, will need admission to the hospital for serial troponins cardiac echo and further cardiology evaluation.  Patient in agreement with plan for admission.         Final Clinical Impression(s) / ED Diagnoses Final diagnoses:  Hypertensive emergency  Cardiomyopathy due to hypertension, with heart failure    Rx / DC Orders ED Discharge Orders     None         Terrilee Files, MD 04/19/23 1730

## 2023-04-19 NOTE — ED Notes (Signed)
Report called  

## 2023-04-20 DIAGNOSIS — I161 Hypertensive emergency: Secondary | ICD-10-CM | POA: Diagnosis not present

## 2023-04-20 DIAGNOSIS — I110 Hypertensive heart disease with heart failure: Secondary | ICD-10-CM

## 2023-04-20 DIAGNOSIS — I43 Cardiomyopathy in diseases classified elsewhere: Secondary | ICD-10-CM

## 2023-04-20 LAB — CBC
HCT: 46.6 % (ref 39.0–52.0)
Hemoglobin: 15.6 g/dL (ref 13.0–17.0)
MCH: 31.6 pg (ref 26.0–34.0)
MCHC: 33.5 g/dL (ref 30.0–36.0)
MCV: 94.5 fL (ref 80.0–100.0)
Platelets: 154 10*3/uL (ref 150–400)
RBC: 4.93 MIL/uL (ref 4.22–5.81)
RDW: 12.6 % (ref 11.5–15.5)
WBC: 7.4 10*3/uL (ref 4.0–10.5)
nRBC: 0 % (ref 0.0–0.2)

## 2023-04-20 LAB — COMPREHENSIVE METABOLIC PANEL
ALT: 18 U/L (ref 0–44)
AST: 20 U/L (ref 15–41)
Albumin: 4.1 g/dL (ref 3.5–5.0)
Alkaline Phosphatase: 56 U/L (ref 38–126)
Anion gap: 10 (ref 5–15)
BUN: 16 mg/dL (ref 6–20)
CO2: 25 mmol/L (ref 22–32)
Calcium: 9.2 mg/dL (ref 8.9–10.3)
Chloride: 104 mmol/L (ref 98–111)
Creatinine, Ser: 1.03 mg/dL (ref 0.61–1.24)
GFR, Estimated: >60 mL/min (ref >60)
Glucose, Bld: 97 mg/dL (ref 70–99)
Potassium: 3.5 mmol/L (ref 3.5–5.1)
Sodium: 139 mmol/L (ref 135–145)
Total Bilirubin: 1.3 mg/dL — ABNORMAL HIGH (ref 0.3–1.2)
Total Protein: 7 g/dL (ref 6.5–8.1)

## 2023-04-20 LAB — LIPID PANEL
Cholesterol: 181 mg/dL (ref 0–200)
HDL: 35 mg/dL — ABNORMAL LOW (ref >40)
LDL Cholesterol: 123 mg/dL — ABNORMAL HIGH (ref 0–99)
Total CHOL/HDL Ratio: 5.2 RATIO
Triglycerides: 116 mg/dL (ref <150)
VLDL: 23 mg/dL (ref 0–40)

## 2023-04-20 LAB — HIV ANTIBODY (ROUTINE TESTING W REFLEX): HIV Screen 4th Generation wRfx: NONREACTIVE

## 2023-04-20 LAB — HEMOGLOBIN A1C
Hgb A1c MFr Bld: 5.2 % (ref 4.8–5.6)
Mean Plasma Glucose: 102.54 mg/dL

## 2023-04-20 LAB — MAGNESIUM: Magnesium: 2 mg/dL (ref 1.7–2.4)

## 2023-04-20 LAB — TSH: TSH: 0.963 u[IU]/mL (ref 0.350–4.500)

## 2023-04-20 MED ORDER — NITROGLYCERIN IN D5W 200-5 MCG/ML-% IV SOLN
0.0000 ug/min | INTRAVENOUS | Status: DC
  Administered 2023-04-20: 5 ug/min via INTRAVENOUS
  Administered 2023-04-21: 75 ug/min via INTRAVENOUS
  Administered 2023-04-21 – 2023-04-22 (×2): 70 ug/min via INTRAVENOUS
  Administered 2023-04-22: 95 ug/min via INTRAVENOUS
  Filled 2023-04-20 (×5): qty 250

## 2023-04-20 NOTE — TOC Initial Note (Signed)
Transition of Care [facility]) - Initial/Assessment Note    Patient Details  Name: Caleb Perkins MRN: [id number] Date of Birth: 1976/11/30  Transition of Care (TOC) CM/SW Contact:    Catalina Gravel, LCSW Phone Number: 04/20/2023, 4:32 PM  Clinical Narrative:                 .. Transition of Care [facility] Screening Note   Patient Details  Name: Caleb Perkins Date of Birth: 1976-12-14   Transition of Care (TOC) CM/SW Contact:    Catalina Gravel, LCSW Phone Number: 04/20/2023, 4:32 PM    Transition of Care Department [facility]) has reviewed patient and no TOC needs have been identified at this time. We will continue to monitor patient advancement through interdisciplinary progression rounds. If new patient transition needs arise, please place a TOC consult.      Barriers to Discharge: Continued Medical Work up   Patient Goals and CMS Choice            Expected Discharge Plan and Services                                              Prior Living Arrangements/Services                       Activities of Daily Living Home Assistive Devices/Equipment: None ADL Screening (condition at time of admission) Patient's cognitive ability adequate to safely complete daily activities?: Yes Is the patient deaf or have difficulty hearing?: No Does the patient have difficulty seeing, even when wearing glasses/contacts?: No Does the patient have difficulty concentrating, remembering, or making decisions?: No Patient able to express need for assistance with ADLs?: Yes Does the patient have difficulty dressing or bathing?: No Independently performs ADLs?: Yes (appropriate for developmental age) Does the patient have difficulty walking or climbing stairs?: No Weakness of Legs: None Weakness of Arms/Hands: None  Permission Sought/Granted                  Emotional Assessment              Admission diagnosis:  Hypertensive emergency [I16.1] Patient  Active Problem List   Diagnosis Date Noted   Hypertensive emergency 04/19/2023   Acute exacerbation of chronic obstructive pulmonary disease (COPD) 11/10/2019   HTN (hypertension) 11/10/2019   Tobacco abuse 11/10/2019   Acute bronchitis 11/09/2019   PCP:  Patient, No Pcp Per Pharmacy:   [id number] - [place], [place] - [address] [place] [address] Phone: [phone] Fax: [phone]     Social Determinants of Health (SDOH) Social History: SDOH Screenings   Food Insecurity: No Food Insecurity (04/19/2023)  Housing: Medium Risk (04/19/2023)  Transportation Needs: No Transportation Needs (04/19/2023)  Utilities: Not At Risk (04/19/2023)  Tobacco Use: High Risk (04/19/2023)   SDOH Interventions:     Readmission Risk Interventions     No data to display

## 2023-04-20 NOTE — Progress Notes (Signed)
Started on nitro gtt for uncontrolled b/p with routine and PRN antihypertensives. Per Dr. Sharl Ma, patient is planning for a cardiac cath on Monday.

## 2023-04-20 NOTE — Progress Notes (Addendum)
[facility]  PROGRESS NOTE  Caleb Perkins GNF:[id number] DOB: 02-Mar-1976 DOA: 04/19/2023 PCP: Patient, No Pcp Per   Brief HPI:    47 y.o. male, with history of hypertension, noncompliant with home medications who has not taken his medications for the last 1-1/2 years, came to hospital with worsening shortness of breath and chest pain.  Patient says that for past few weeks she has noticed dyspnea on exertion, also had left-sided chest pain.  Complains of intermittent nausea and vomiting.  In the ED he was found to be in hypertensive emergency with elevated troponin, BNP.  Blood pressure was elevated up to 202/145.  Patient says that he moved and did not get refills for his medications so was unable to take medications for last 1-1/2 years.    Subjective   Patient seen and examined, feels better this morning.   Assessment/Plan:    Hypertensive emergency -Presented with BP 202/145 in setting of noncompliance with medications -Found to have elevated troponin with CHF exacerbation -Currently blood pressure has significantly improved after he received IV labetalol and started on Coreg 6.25 twice daily along with Aldactone and amlodipine per cardiology  Acute diastolic CHF/dyspnea on exertion -BNP was elevated at 495.0 -2D echocardiogram obtained yesterday showed EF 35 to 40%, moderate LVH, left ventricle diastolic parameters consistent with grade 2 diastolic dysfunction, left atrial pressure -He did receive Lasix 40 mg IV x 1 yesterday -Continue Aldactone, Coreg -Cardiology following, plan for cardiac cath on Monday   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  Recommend repeat CTA chest in 3 months.  Bilateral patchy/groundglass opacities -seen on CT chest, likely in setting of pulmonary edema.  However recommend repeat CT chest in 3 months.  Given Lasix 40 mg IV x 1 in the ED.  Currently not requiring  oxygen.     Medications     amLODipine  10 mg Oral Daily   aspirin EC  81 mg Oral Daily   atorvastatin  40 mg Oral Daily   carvedilol  6.25 mg Oral BID WC   Chlorhexidine Gluconate Cloth  6 each Topical Daily   enoxaparin (LOVENOX) injection  40 mg Subcutaneous Q24H   spironolactone  25 mg Oral Daily     Data Reviewed:   CBG:  No results for input(s): "GLUCAP" in the last 168 hours.  SpO2: 98 %    Vitals:   04/20/23 0500 04/20/23 0515 04/20/23 0801 04/20/23 0833  BP: (!) 161/112  (!) 174/117 (!) 147/95  Pulse: 64 66 66 81  Resp: Temp:  98.3 F (36.8 C) 98.5 F (36.9 C)   TempSrc:  Oral Oral   SpO2: 96% 98% 98%   Weight:  66.2 kg    Height:          Data Reviewed:  Basic Metabolic Panel: Recent Labs  Lab 04/19/23 0741 04/20/23 0518  NA 140 139  K 3.9 3.5  CL 107 104  CO2 23 25  GLUCOSE 111* 97  BUN 19 16  CREATININE 1.08 1.03  CALCIUM 9.1 9.2  MG  --  2.0    CBC: Recent Labs  Lab 04/19/23 0741 04/20/23 0518  WBC 8.8 7.4  NEUTROABS 6.5  --   HGB 15.4 15.6  HCT 47.9 46.6  MCV 98.2 94.5  PLT 158 154    LFT Recent Labs  Lab 04/19/23 0741 04/20/23 0518  AST 28 20  ALT 20  18  ALKPHOS 58 56  BILITOT 0.5 1.3*  PROT 7.5 7.0  ALBUMIN 4.4 4.1     Antibiotics: Anti-infectives (From admission, onward)    None        DVT prophylaxis: Lovenox  Code Status: Full code  Family Communication:    CONSULTS cardiology   Objective    Physical Examination:   General-appears in no acute distress Heart-S1-S2, regular, no murmur auscultated Lungs-clear to auscultation bilaterally, no wheezing or crackles auscultated Abdomen-soft, nontender, no organomegaly Extremities-no edema in the lower extremities Neuro-alert, oriented x3, no focal deficit noted   Status is: Inpatient:             Caleb Perkins S Caleb Perkins   [facility] If 7PM-7AM, please contact night-coverage at [email or web address], Office   [phone]   04/20/2023, 9:03 AM  LOS: 1 day

## 2023-04-21 DIAGNOSIS — I161 Hypertensive emergency: Secondary | ICD-10-CM | POA: Diagnosis not present

## 2023-04-21 DIAGNOSIS — I110 Hypertensive heart disease with heart failure: Secondary | ICD-10-CM | POA: Diagnosis not present

## 2023-04-21 DIAGNOSIS — I43 Cardiomyopathy in diseases classified elsewhere: Secondary | ICD-10-CM | POA: Diagnosis not present

## 2023-04-21 NOTE — Progress Notes (Signed)
[facility]  PROGRESS NOTE  Caleb Perkins YQM:[id number] DOB: Jul 11, 1976 DOA: 04/19/2023 PCP: Patient, No Pcp Per   Brief HPI:    47 y.o. male, with history of hypertension, noncompliant with home medications who has not taken his medications for the last 1-1/2 years, came to hospital with worsening shortness of breath and chest pain.  Patient says that for past few weeks she has noticed dyspnea on exertion, also had left-sided chest pain.  Complains of intermittent nausea and vomiting.  In the ED he was found to be in hypertensive emergency with elevated troponin, BNP.  Blood pressure was elevated up to 202/145.  Patient says that he moved and did not get refills for his medications so was unable to take medications for last 1-1/2 years.    Subjective   Patient seen and examined, denies chest pain or shortness of breath.   Assessment/Plan:    Hypertensive emergency -Presented with BP 202/145 in setting of noncompliance with medications -Found to have elevated troponin with CHF exacerbation -Currently blood pressure has significantly improved after he received IV labetalol and started on Coreg 6.25 twice daily along with Aldactone and amlodipine per cardiology -Started on nitroglycerin gtt., blood pressure has improved.  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  Recommend repeat CTA chest in 3 months.  Bilateral patchy/groundglass opacities -seen on CT chest, likely in setting of pulmonary edema.  However recommend  repeat CT chest in 3 months.  Given Lasix 40 mg IV x 1 in the ED.  Currently not requiring oxygen.     Medications     amLODipine  10 mg Oral Daily   aspirin EC  81 mg Oral Daily   atorvastatin  40 mg Oral Daily   carvedilol  6.25 mg Oral BID WC   Chlorhexidine Gluconate Cloth  6 each Topical Daily   enoxaparin (LOVENOX) injection  40 mg Subcutaneous Q24H   spironolactone  25 mg Oral Daily     Data Reviewed:   CBG:  No results for input(s): "GLUCAP" in the last 168 hours.  SpO2: 97 %    Vitals:   04/21/23 0530 04/21/23 0600 04/21/23 0630 04/21/23 0732  BP: (!) 144/90 134/79 (!) 134/94 (!) 141/91  Pulse: 84 81 97 (!) 101  Resp: (!) 24  Temp:    98.3 F (36.8 C)  TempSrc:    Oral  SpO2: 96% 96% 92% 97%  Weight:      Height:          Data Reviewed:  Basic Metabolic Panel: Recent Labs  Lab 04/19/23 0741 04/20/23 0518  NA 140 139  K 3.9 3.5  CL 107 104  CO2 23 25  GLUCOSE 111* 97  BUN 19 16  CREATININE 1.08 1.03  CALCIUM 9.1 9.2  MG  --  2.0    CBC: Recent Labs  Lab 04/19/23 0741 04/20/23 0518  WBC 8.8 7.4  NEUTROABS 6.5  --   HGB 15.4 15.6  HCT 47.9 46.6  MCV 98.2 94.5  PLT 158 154    LFT  Recent Labs  Lab 04/19/23 0741 04/20/23 0518  AST 28 20  ALT 20 18  ALKPHOS 58 56  BILITOT 0.5 1.3*  PROT 7.5 7.0  ALBUMIN 4.4 4.1     Antibiotics: Anti-infectives (From admission, onward)    None        DVT prophylaxis: Lovenox  Code Status: Full code  Family Communication:    CONSULTS cardiology   Objective    Physical Examination:  General-appears in no acute distress Heart-S1-S2, regular, no murmur auscultated Lungs-clear to auscultation bilaterally, no wheezing or crackles auscultated Abdomen-soft, nontender, no organomegaly Extremities-no edema in the lower extremities Neuro-alert, oriented x3, no focal deficit noted   Status is: Inpatient:             Meredeth Ide   [facility] If  7PM-7AM, please contact night-coverage at [email or web address], Office  [phone]   04/21/2023, 9:05 AM  LOS: 2 days

## 2023-04-22 ENCOUNTER — Encounter (HOSPITAL_COMMUNITY)
Admission: EM | Disposition: A | Discharge status: Home / Self Care | Payer: Self-pay | Source: Home / Self Care | Attending: Thoracic Surgery (Cardiothoracic Vascular Surgery)

## 2023-04-22 DIAGNOSIS — I2510 Atherosclerotic heart disease of native coronary artery without angina pectoris: Secondary | ICD-10-CM

## 2023-04-22 DIAGNOSIS — I110 Hypertensive heart disease with heart failure: Secondary | ICD-10-CM | POA: Diagnosis not present

## 2023-04-22 DIAGNOSIS — I5021 Acute systolic (congestive) heart failure: Secondary | ICD-10-CM

## 2023-04-22 DIAGNOSIS — I2584 Coronary atherosclerosis due to calcified coronary lesion: Secondary | ICD-10-CM

## 2023-04-22 DIAGNOSIS — I272 Pulmonary hypertension, unspecified: Secondary | ICD-10-CM | POA: Diagnosis not present

## 2023-04-22 DIAGNOSIS — I161 Hypertensive emergency: Secondary | ICD-10-CM | POA: Diagnosis not present

## 2023-04-22 HISTORY — PX: RIGHT/LEFT HEART CATH AND CORONARY ANGIOGRAPHY: CATH118266

## 2023-04-22 LAB — POCT I-STAT 7, (LYTES, BLD GAS, ICA,H+H)
Acid-base deficit: 2 mmol/L (ref 0.0–2.0)
Bicarbonate: 23.4 mmol/L (ref 20.0–28.0)
Calcium, Ion: 1.25 mmol/L (ref 1.15–1.40)
HCT: 44 % (ref 39.0–52.0)
Hemoglobin: 15 g/dL (ref 13.0–17.0)
O2 Saturation: 99 %
Potassium: 4 mmol/L (ref 3.5–5.1)
Sodium: 137 mmol/L (ref 135–145)
TCO2: 25 mmol/L (ref 22–32)
pCO2 arterial: 40.6 mmHg (ref 32–48)
pH, Arterial: 7.369 (ref 7.35–7.45)
pO2, Arterial: 152 mmHg — ABNORMAL HIGH (ref 83–108)

## 2023-04-22 LAB — POCT I-STAT EG7
Acid-base deficit: 2 mmol/L (ref 0.0–2.0)
Bicarbonate: 23.9 mmol/L (ref 20.0–28.0)
Calcium, Ion: 1.19 mmol/L (ref 1.15–1.40)
HCT: 44 % (ref 39.0–52.0)
Hemoglobin: 15 g/dL (ref 13.0–17.0)
O2 Saturation: 75 %
Potassium: 4 mmol/L (ref 3.5–5.1)
Sodium: 139 mmol/L (ref 135–145)
TCO2: 25 mmol/L (ref 22–32)
pCO2, Ven: 43.3 mmHg — ABNORMAL LOW (ref 44–60)
pH, Ven: 7.351 (ref 7.25–7.43)
pO2, Ven: 42 mmHg (ref 32–45)

## 2023-04-22 LAB — CBC
HCT: 42.9 % (ref 39.0–52.0)
Hemoglobin: 14.6 g/dL (ref 13.0–17.0)
MCH: 31.7 pg (ref 26.0–34.0)
MCHC: 34 g/dL (ref 30.0–36.0)
MCV: 93.1 fL (ref 80.0–100.0)
Platelets: 152 10*3/uL (ref 150–400)
RBC: 4.61 MIL/uL (ref 4.22–5.81)
RDW: 12.4 % (ref 11.5–15.5)
WBC: 9.5 10*3/uL (ref 4.0–10.5)
nRBC: 0 % (ref 0.0–0.2)

## 2023-04-22 LAB — BASIC METABOLIC PANEL
Anion gap: 10 (ref 5–15)
BUN: 19 mg/dL (ref 6–20)
CO2: 22 mmol/L (ref 22–32)
Calcium: 9.1 mg/dL (ref 8.9–10.3)
Chloride: 103 mmol/L (ref 98–111)
Creatinine, Ser: 1.04 mg/dL (ref 0.61–1.24)
GFR, Estimated: >60 mL/min (ref >60)
Glucose, Bld: 109 mg/dL — ABNORMAL HIGH (ref 70–99)
Potassium: 3.7 mmol/L (ref 3.5–5.1)
Sodium: 135 mmol/L (ref 135–145)

## 2023-04-22 SURGERY — RIGHT/LEFT HEART CATH AND CORONARY ANGIOGRAPHY
Anesthesia: LOCAL

## 2023-04-22 MED ORDER — SODIUM CHLORIDE 0.9% FLUSH: 3.0000 mL | INTRAVENOUS | Status: DC | PRN

## 2023-04-22 MED ORDER — HEPARIN (PORCINE) IN NACL 1000-0.9 UT/500ML-% IV SOLN
INTRAVENOUS | Status: DC | PRN
  Administered 2023-04-22 (×2): 500 mL

## 2023-04-22 MED ORDER — FENTANYL CITRATE (PF) 100 MCG/2ML IJ SOLN
INTRAMUSCULAR | Status: DC | PRN
  Administered 2023-04-22: 25 ug via INTRAVENOUS

## 2023-04-22 MED ORDER — HEPARIN SODIUM (PORCINE) 1000 UNIT/ML IJ SOLN
INTRAMUSCULAR | Status: AC
  Filled 2023-04-22: qty 10

## 2023-04-22 MED ORDER — ISOSORBIDE MONONITRATE ER 30 MG PO TB24
30.0000 mg | ORAL_TABLET | Freq: Every day | ORAL | Status: DC
  Administered 2023-04-22 – 2023-04-24 (×3): 30 mg via ORAL
  Filled 2023-04-22 (×3): qty 1

## 2023-04-22 MED ORDER — POTASSIUM CHLORIDE CRYS ER 20 MEQ PO TBCR
30.0000 meq | EXTENDED_RELEASE_TABLET | Freq: Once | ORAL | Status: AC
  Administered 2023-04-22: 30 meq via ORAL
  Filled 2023-04-22: qty 1

## 2023-04-22 MED ORDER — FENTANYL CITRATE (PF) 100 MCG/2ML IJ SOLN
INTRAMUSCULAR | Status: AC
  Filled 2023-04-22: qty 2

## 2023-04-22 MED ORDER — SODIUM CHLORIDE 0.9 % IV SOLN: 250.0000 mL | INTRAVENOUS | Status: DC | PRN

## 2023-04-22 MED ORDER — SODIUM CHLORIDE 0.9% FLUSH
3.0000 mL | Freq: Two times a day (BID) | INTRAVENOUS | Status: DC
  Administered 2023-04-22 – 2023-04-23 (×2): 3 mL via INTRAVENOUS

## 2023-04-22 MED ORDER — SODIUM CHLORIDE 0.9 % IV SOLN: INTRAVENOUS | Status: DC

## 2023-04-22 MED ORDER — SODIUM CHLORIDE 0.9% FLUSH
3.0000 mL | Freq: Two times a day (BID) | INTRAVENOUS | Status: DC
  Administered 2023-04-23: 3 mL via INTRAVENOUS

## 2023-04-22 MED ORDER — VERAPAMIL HCL 2.5 MG/ML IV SOLN
INTRAVENOUS | Status: DC | PRN
  Administered 2023-04-22: 10 mL via INTRA_ARTERIAL

## 2023-04-22 MED ORDER — VERAPAMIL HCL 2.5 MG/ML IV SOLN
INTRAVENOUS | Status: AC
  Filled 2023-04-22: qty 2

## 2023-04-22 MED ORDER — HEPARIN (PORCINE) 25000 UT/250ML-% IV SOLN
1400.0000 [IU]/h | INTRAVENOUS | Status: DC
  Administered 2023-04-22: 1050 [IU]/h via INTRAVENOUS
  Administered 2023-04-23 – 2023-04-25 (×3): 1400 [IU]/h via INTRAVENOUS
  Filled 2023-04-22 (×4): qty 250

## 2023-04-22 MED ORDER — LIDOCAINE HCL (PF) 1 % IJ SOLN
INTRAMUSCULAR | Status: DC | PRN
  Administered 2023-04-22 (×2): 5 mL via INTRADERMAL

## 2023-04-22 MED ORDER — IOHEXOL 350 MG/ML SOLN
INTRAVENOUS | Status: DC | PRN
  Administered 2023-04-22: 75 mL

## 2023-04-22 MED ORDER — ASPIRIN 81 MG PO CHEW: 81.0000 mg | CHEWABLE_TABLET | ORAL | Status: DC

## 2023-04-22 MED ORDER — AMLODIPINE BESYLATE 10 MG PO TABS
10.0000 mg | ORAL_TABLET | Freq: Every day | ORAL | Status: DC
  Administered 2023-04-22 – 2023-04-24 (×3): 10 mg via ORAL
  Filled 2023-04-22 (×2): qty 1
  Filled 2023-04-22: qty 2

## 2023-04-22 MED ORDER — CARVEDILOL 3.125 MG PO TABS
6.2500 mg | ORAL_TABLET | Freq: Once | ORAL | Status: AC
  Filled 2023-04-22: qty 2

## 2023-04-22 MED ORDER — MIDAZOLAM HCL 2 MG/2ML IJ SOLN
INTRAMUSCULAR | Status: DC | PRN
  Administered 2023-04-22: 1 mg via INTRAVENOUS

## 2023-04-22 MED ORDER — LIDOCAINE HCL (PF) 1 % IJ SOLN
INTRAMUSCULAR | Status: AC
  Filled 2023-04-22: qty 30

## 2023-04-22 MED ORDER — HYDRALAZINE HCL 20 MG/ML IJ SOLN: 10.0000 mg | INTRAMUSCULAR | Status: AC | PRN

## 2023-04-22 MED ORDER — MIDAZOLAM HCL 2 MG/2ML IJ SOLN
INTRAMUSCULAR | Status: AC
  Filled 2023-04-22: qty 2

## 2023-04-22 MED ORDER — CARVEDILOL 12.5 MG PO TABS
12.5000 mg | ORAL_TABLET | Freq: Two times a day (BID) | ORAL | Status: DC
  Administered 2023-04-23 – 2023-04-24 (×4): 12.5 mg via ORAL
  Filled 2023-04-22 (×5): qty 1

## 2023-04-22 MED ORDER — SODIUM CHLORIDE 0.9 % IV SOLN: INTRAVENOUS | Status: AC

## 2023-04-22 MED ORDER — SACUBITRIL-VALSARTAN 24-26 MG PO TABS
1.0000 | ORAL_TABLET | Freq: Two times a day (BID) | ORAL | Status: DC
  Administered 2023-04-22 – 2023-04-23 (×3): 1 via ORAL
  Filled 2023-04-22 (×3): qty 1

## 2023-04-22 MED ORDER — HEPARIN SODIUM (PORCINE) 1000 UNIT/ML IJ SOLN
INTRAMUSCULAR | Status: DC | PRN
  Administered 2023-04-22: 3000 [IU] via INTRAVENOUS

## 2023-04-22 SURGICAL SUPPLY — 11 items
CATH 5FR JL3.5 JR4 ANG PIG MP (CATHETERS) IMPLANT
CATH BALLN WEDGE 5F 110CM (CATHETERS) IMPLANT
DEVICE RAD COMP TR BAND LRG (VASCULAR PRODUCTS) IMPLANT
GLIDESHEATH SLEND SS 6F .021 (SHEATH) IMPLANT
GUIDEWIRE INQWIRE 1.5J.035X260 (WIRE) IMPLANT
INQWIRE 1.5J .035X260CM (WIRE) ×1
KIT HEART LEFT (KITS) ×1 IMPLANT
PACK CARDIAC CATHETERIZATION (CUSTOM PROCEDURE TRAY) ×1 IMPLANT
SHEATH GLIDE SLENDER 4/5FR (SHEATH) IMPLANT
TRANSDUCER W/STOPCOCK (MISCELLANEOUS) ×1 IMPLANT
TUBING CIL FLEX 10 FLL-RA (TUBING) ×1 IMPLANT

## 2023-04-22 NOTE — H&P (View-Only) (Signed)
[facility] has been called. OK for tele bed per d/w Dr. Mallipeddi. Cardmaster made aware. Have also relayed plan to IM. 

## 2023-04-22 NOTE — Progress Notes (Signed)
Patient transferred from cath lab at 1845hrs.  NTG gtt IV infusing at 27mcg/h, will wean off per order.  Right radial site level zero.  Patient oriented to unit and plan of care for shift.

## 2023-04-22 NOTE — Progress Notes (Signed)
PROGRESS NOTE    Caleb Perkins  KGM:[id number] DOB: Apr 18, 1976 DOA: 04/19/2023 PCP: Patient, No Pcp Per   Brief Narrative:    47 y.o. male, with history of hypertension, noncompliant with home medications who has not taken his medications for the last 1-1/2 years, came to hospital with worsening shortness of breath and chest pain.  Patient says that for past few weeks she has noticed dyspnea on exertion, also had left-sided chest pain.  Complains of intermittent nausea and vomiting.  In the ED he was found to be in hypertensive emergency with elevated troponin, BNP.  Blood pressure was elevated up to 202/145.  Patient says that he moved and did not get refills for his medications so was unable to take medications for last 1-1/2 years.  Patient was admitted with acute diastolic CHF exacerbation in the setting of hypertensive emergency and was noted to have troponin elevation.  Planning for transfer to [facility] for cardiac catheterization today.  Assessment & Plan:   Principal Problem:   Hypertensive emergency  Assessment and Plan:   Hypertensive emergency -Presented with BP 202/145 in setting of noncompliance with medications -Found to have elevated troponin with CHF exacerbation -Currently blood pressure has significantly improved after he received IV labetalol and started on Coreg 6.25 twice daily along with Aldactone and amlodipine per cardiology -Started on nitroglycerin gtt., blood pressure has improved.  Per cardiology with plans to wean drip and add Imdur and increase carvedilol.   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  Recommend repeat CTA chest in 3 months.   Bilateral patchy/groundglass opacities -seen on CT chest, likely in setting of pulmonary edema.  However recommend repeat CT chest in 3 months.  Given Lasix 40 mg IV x 1 in the ED.  Currently not requiring oxygen.    DVT prophylaxis: Lovenox Code Status: Full Family Communication: None at bedside Disposition Plan:  Status is: Inpatient Remains inpatient appropriate because: Need for IV medications and inpatient procedure.   Consultants:  Cardiology  Procedures:  None  Antimicrobials:  None   Subjective: Patient seen and evaluated today with no new acute complaints or concerns. No acute concerns or events noted overnight.  He denies any chest pain or shortness of breath.  Objective: Vitals:   04/22/23 0630 04/22/23 0700 04/22/23 0733 04/22/23 0800  BP: (!) 147/86 (!) 136/91  (!) 146/99  Pulse: 72 74  74  Resp:      Temp:   99.3 F (37.4 C)   TempSrc:   Oral   SpO2: 95% 97%    Weight:      Height:        Intake/Output Summary (Last 24 hours) at 04/22/2023 0940 Last data filed at 04/22/2023 0744 Gross per 24 hour  Intake 1420.24 ml  Output 650 ml  Net 770.24 ml   Filed Weights   04/20/23 0515 04/21/23 0500 04/22/23 0500  Weight: 66.2 kg 66.2 kg 66.2 kg    Examination:  General exam: Appears calm and comfortable  Respiratory system: Clear to auscultation. Respiratory effort normal.  Cardiovascular system: S1 & S2 heard, RRR.  Gastrointestinal system: Abdomen is soft Central nervous system: Alert and awake Extremities: No edema Skin: No significant lesions noted Psychiatry: Flat affect.    Data Reviewed: I have personally reviewed following labs and imaging studies  CBC: Recent Labs  Lab 04/19/23 0741 04/20/23 0518 04/22/23 0841  WBC 8.8 7.4 9.5  NEUTROABS 6.5  --   --   HGB 15.4 15.6 14.6  HCT 47.9  46.6 42.9  MCV 98.2 94.5 93.1  PLT 158 154 152   Basic Metabolic Panel: Recent Labs  Lab 04/19/23 0741 04/20/23 0518 04/22/23 0841  NA 140 139 135  K 3.9 3.5 3.7  CL 107 104 103  CO2 GLUCOSE 111* 97 109*  BUN CREATININE 1.08 1.03 1.04  CALCIUM 9.1 9.2 9.1  MG  --  2.0  --    GFR: Estimated Creatinine Clearance: 84.9 mL/min (by C-G formula based on SCr of 1.04 mg/dL). Liver Function Tests: Recent Labs  Lab 04/19/23 0741 04/20/23 0518  AST 28 20  ALT 20 18  ALKPHOS 58 56  BILITOT 0.5 1.3*  PROT 7.5 7.0  ALBUMIN 4.4 4.1   Recent Labs  Lab 04/19/23 0741  LIPASE 35   No results for input(s): "AMMONIA" in the last 168 hours. Coagulation Profile: No results for input(s): "INR", "PROTIME" in the last 168 hours. Cardiac Enzymes: No results for input(s): "CKTOTAL", "CKMB", "CKMBINDEX", "TROPONINI" in the last 168 hours. BNP (last 3 results) No results for input(s): "PROBNP" in the last 8760 hours. HbA1C: Recent Labs    04/20/23 0518  HGBA1C 5.2   CBG: No results for input(s): "GLUCAP" in the last 168 hours. Lipid Profile: Recent Labs    04/20/23 0518  CHOL 181  HDL 35*  LDLCALC 123*  TRIG 116  CHOLHDL 5.2   Thyroid Function Tests: Recent Labs    04/20/23 0518  TSH 0.963   Anemia Panel: No results for input(s): "VITAMINB12", "FOLATE", "FERRITIN", "TIBC", "IRON", "RETICCTPCT" in the last 72 hours. Sepsis Labs: No results for input(s): "PROCALCITON", "LATICACIDVEN" in the last 168 hours.  Recent Results (from the past 240 hour(s))  MRSA Next Gen by PCR, Nasal     Status: None   Collection Time: 04/19/23  4:19 PM   Specimen: Nasal Mucosa; Nasal Swab  Result Value Ref Range Status   MRSA by PCR Next Gen NOT DETECTED NOT DETECTED Final    Comment: (NOTE) The GeneXpert MRSA Assay (FDA approved for NASAL specimens only), is one component of a comprehensive MRSA colonization surveillance program. It is not intended to diagnose MRSA  infection nor to guide or monitor treatment for MRSA infections. Test performance is not FDA approved in patients less than 42 years old. Performed at [facility], [address], [place], [place] [address]          Radiology Studies: No results found.      Scheduled Meds:  amLODipine  10 mg Oral Daily   aspirin  81 mg Oral Pre-Cath   aspirin EC  81 mg Oral Daily   atorvastatin  40 mg Oral Daily   carvedilol  12.5 mg Oral BID WC   carvedilol  6.25 mg Oral Once   Chlorhexidine Gluconate Cloth  6 each Topical Daily   enoxaparin (LOVENOX) injection  40 mg Subcutaneous Q24H   isosorbide mononitrate  30 mg Oral Daily   potassium chloride  30 mEq Oral Once   sodium chloride  flush  3 mL Intravenous Q12H   spironolactone  25 mg Oral Daily   Continuous Infusions:  sodium chloride     sodium chloride     nitroGLYCERIN 95 mcg/min (04/22/23 0744)     LOS: 3 days    Time spent: 35 minutes    Deryl Giroux D Sherryll Burger, DO [facility]  If 7PM-7AM, please contact night-coverage [email or web address] 04/22/2023, 9:40 AM

## 2023-04-22 NOTE — Progress Notes (Signed)
[facility] has been called. OK for tele bed per d/w Dr. Jenene Slicker. Cardmaster made aware. Have also relayed plan to IM.

## 2023-04-22 NOTE — Progress Notes (Addendum)
Progress Note  Patient Name: Caleb Perkins Date of Encounter: 04/22/2023  Primary Cardiologist: Dina Rich, MD  Subjective   Feeling much better in all aspects. No CP or SOB. Asymptomatic.  Inpatient Medications    Scheduled Meds:  aspirin EC  81 mg Oral Daily   atorvastatin  40 mg Oral Daily   carvedilol  6.25 mg Oral BID WC   Chlorhexidine Gluconate Cloth  6 each Topical Daily   enoxaparin (LOVENOX) injection  40 mg Subcutaneous Q24H   spironolactone  25 mg Oral Daily   Continuous Infusions:  nitroGLYCERIN 95 mcg/min (04/22/23 0744)   PRN Meds: acetaminophen **OR** acetaminophen, hydrALAZINE, ondansetron **OR** ondansetron (ZOFRAN) IV   Vital Signs    Vitals:   04/22/23 0630 04/22/23 0700 04/22/23 0733 04/22/23 0800  BP: (!) 147/86 (!) 136/91  (!) 146/99  Pulse: 72 74  74  Resp:      Temp:   99.3 F (37.4 C)   TempSrc:   Oral   SpO2: 95% 97%    Weight:      Height:        Intake/Output Summary (Last 24 hours) at 04/22/2023 0816 Last data filed at 04/22/2023 0744 Gross per 24 hour  Intake 1660.24 ml  Output 850 ml  Net 810.24 ml      04/22/2023    5:00 AM 04/21/2023    5:00 AM 04/20/2023    5:15 AM  Last 3 Weights  Weight (lbs) 145 lb 15.1 oz 145 lb 15.1 oz 145 lb 15.1 oz  Weight (kg) 66.2 kg 66.2 kg 66.2 kg     Telemetry    NSR, one PVC triplet - Personally Reviewed  ECG    No new tracings - Personally Reviewed  Physical Exam   GEN: No acute distress.  HEENT: Normocephalic, atraumatic, sclera non-icteric. Neck: No JVD or bruits. Cardiac: RRR no murmurs, rubs, or gallops.  Respiratory: Clear to auscultation bilaterally. Breathing is unlabored. GI: Soft, nontender, non-distended, BS +x 4. MS: no deformity. Extremities: No clubbing or cyanosis. No edema. Distal pedal pulses are 2+ and equal bilaterally. Neuro:  AAOx3. Follows commands. Psych:  Responds to questions appropriately with a normal affect.  Labs    High Sensitivity  Troponin:   Recent Labs  Lab 04/19/23 0741 04/19/23 0948  TROPONINIHS 78* 102*      Cardiac EnzymesNo results for input(s): "TROPONINI" in the last 168 hours. No results for input(s): "TROPIPOC" in the last 168 hours.   Chemistry Recent Labs  Lab 04/19/23 0741 04/20/23 0518  NA 140 139  K 3.9 3.5  CL 107 104  CO2 23 25  GLUCOSE 111* 97  BUN 19 16  CREATININE 1.08 1.03  CALCIUM 9.1 9.2  PROT 7.5 7.0  ALBUMIN 4.4 4.1  AST 28 20  ALT 20 18  ALKPHOS 58 56  BILITOT 0.5 1.3*  GFRNONAA >60 >60  ANIONGAP 10 10     Hematology Recent Labs  Lab 04/19/23 0741 04/20/23 0518  WBC 8.8 7.4  RBC 4.88 4.93  HGB 15.4 15.6  HCT 47.9 46.6  MCV 98.2 94.5  MCH 31.6 31.6  MCHC 32.2 33.5  RDW 12.6 12.6  PLT 158 154    BNP Recent Labs  Lab 04/19/23 0741  BNP 495.0*     DDimer No results for input(s): "DDIMER" in the last 168 hours.   Radiology    No results found.  Cardiac Studies   2d echo 04/19/23   1. Left ventricular ejection fraction,  by estimation, is 35 to 40%. The  left ventricle has moderately decreased function. The left ventricle  demonstrates global hypokinesis. There is moderate left ventricular  hypertrophy. Left ventricular diastolic  parameters are consistent with Grade II diastolic dysfunction  (pseudonormalization). Elevated left atrial pressure.   2. Right ventricular systolic function is normal. The right ventricular  size is normal. There is severely elevated pulmonary artery systolic  pressure.   3. Left atrial size was mildly dilated.   4. Right atrial size was mildly dilated.   5. The mitral valve is abnormal. Mild mitral valve regurgitation. No  evidence of mitral stenosis.   6. The tricuspid valve is abnormal.   7. The aortic valve is tricuspid. Aortic valve regurgitation is mild. No  aortic stenosis is present.   8. Aortic dilatation noted. There is mild dilatation of the ascending  aorta, measuring 40 mm.   9. The inferior vena cava  is dilated in size with <50% respiratory  variability, suggesting right atrial pressure of 15 mmHg.    Patient Profile     47 y.o. male with hx of HTN ("for most of my life"/on and off medication), tobacco abuse, THC use, prior homelessness (now has a residence) admitted with hypertensive emergency and acute CHF, found to have systolic dysfunction.  Assessment & Plan      1. Hypertensive Emergency with acute HFrEF, severe pulmonary HTN - Initial BP at 228/156 and this is in the setting of dietary noncompliance and medication noncompliance as he has been without BP medications for several years, works at [employer] - s/p IV Lasix earlier this admission, appears euvolemic - 2D Echo 04/19/23 EF 35-40%, global HK, G2DD, normal RV, severe pulmonary HTN, elevated LAP, mild MR, mild AI, mild dilation of ascending aorta - suspect NICM due to poorly controlled HTN but needs cath for definitive evaluation - per d/w Dr. Jenene Slicker: transfer to [facility] on our service for R+LHC today, wean NTG drip down and add Imdur  daily, increase carvedilol to 12.5mg  BID, continue spironolactone, and continue amlodipine  daily (consider transition to [facility] post cath)  Shared Decision Making/Informed Consent The risks [stroke (1 in 1000), death (1 in 1000), kidney failure [usually temporary] (1 in 500), bleeding (1 in 200), allergic reaction [possibly serious] (1 in 200)], benefits (diagnostic support and management of coronary artery disease) and alternatives of a cardiac catheterization were discussed in detail with Mr. Dunnavant and he is willing to proceed.  3. Elevated Troponin/Age-Advanced Coronary Calcification by CT/Hyperlipidemia - hsTroponin 78->102 - suspect demand ischemia but plan cath as above - was not felt to require heparin gtt on admission - BB/NTG as above, otherwise continue ASA - LDL 123, trig 116 - started on atorvastatin  daily - if the patient is tolerating statin at time of  follow-up appointment, would consider rechecking liver function/lipid panel in 6-8 weeks  4. Dilation of the Ascending Aorta - Measured 4.1 cm this admission. Will need repeat imaging by 1 year. Will need a follow-up CT prior to this though given his abnormal lung findings as discussed above with 3 month follow-up imaging recommended.   5. Abnormal chest CT - "Interlobular septal thickening in both lungs with a small right pleural effusion and patchy ground-glass opacities in the right upper and lower lobes most in keeping with pulmonary interstitial edema and superimposed infection or inflammation. Recommend follow-up chest CT in 3 months to assess for resolution" - needs attention to this as OP - low grade temp elevation at times  but normal WBC - per d/w Dr. Sherryll Burger, no further workup needed at this ime  6. SVT - reported to have had short burst of SVT on telemetry while in the ED - quiescent, otherwise only one PVC triplet overnight - TSH wnl - Mg has been normal - K 3.7, will supplement    For questions or updates, please contact [facility] Please consult [email or web address] for contact info under Cardiology/STEMI.  Signed, Laurann Montana, PA-C 04/22/2023, 8:16 AM      Attending attestation  Patient seen and independently examined with Ronie Spies, PA-C. We discussed all aspects of the encounter. I agree with the assessment and plan as stated above.   Patient is a 47 year old M known to have poorly controlled HTN with new onset cardiomyopathy, LVEF 30 to 35%, severe pulmonary HTN and imaging evidence of advanced coronary artery calcifications is admitted to [facility] with hypertensive emergency and chest pain likely secondary to myocardial injury.  EKG showed NSR, LVH with repolarization abnormality but CAD changes cannot be ruled out completely.  Patient currently is is on NTG drip.  Physical examination is remarkable for patient not in acute respite distress, no JVD,  HEENT normal, S1-S2 normal, clear lungs, nondistended and nontender abdomen, no pitting edema in the bilateral lower extremities.  # Hypertensive emergency # New onset cardiomyopathy LVEF 30 to 35% # Severe pulmonary HTN # Imaging evidence of age advanced coronary artery calcifications -Wean off NTG drip, start Imdur 30 mg once daily, increase carvedilol from 6.25 mg to 12.5 mg twice daily, continue amlodipine 10 mg once daily (EF neutral), continue spironolactone 25 mg once daily.  New onset cardiomyopathy with LVEF 30 to 35% could likely be secondary to poorly controlled HTN for quite a while as he was not on any blood pressure medications at home (due to financial reasons). Plan was to schedule LHC per cardiology note from last week.  He will benefit from both LHC and RHC due to new onset cardiomyopathy and severe pulmonary HTN on echocardiogram. Risks and benefits of cardiac catheterization have been discussed with the patient.  These include bleeding, infection, kidney damage, stroke, heart attack, death.  The patient understands these risks and is willing to proceed. Patient is n.p.o. today for LHC and RHC at [facility] today.  I have spent a total of 30 minutes with patient reviewing chart , telemetry, EKGs, labs and examining patient as well as establishing an assessment and plan that was discussed with the patient.  > 50% of time was spent in direct patient care.    Colleen Kotlarz Verne Spurr, MD [facility]  10:11 AM

## 2023-04-22 NOTE — Interval H&P Note (Signed)
History and Physical Interval Note:  04/22/2023 4:02 PM  Caleb Perkins  has presented today for surgery, with the diagnosis of acute HFrEF and elevated troponin.  The various methods of treatment have been discussed with the patient and family. After consideration of risks, benefits and other options for treatment, the patient has consented to  Procedure(s): RIGHT/LEFT HEART CATH AND CORONARY ANGIOGRAPHY (N/A) as a surgical intervention.  The patient's history has been reviewed, patient examined, no change in status, stable for surgery.  I have reviewed the patient's chart and labs.  Questions were answered to the patient's satisfaction.    Cath Lab Visit (complete for each Cath Lab visit)  Clinical Evaluation Leading to the Procedure:   ACS: Yes.    Non-ACS:  N/A  Miguel Medal

## 2023-04-22 NOTE — Progress Notes (Signed)
[facility] in to transport patient to [facility] cath lab. Report given. Writer called [facility] Cath lab and gave report to the receiving RN. Writer called patient's mother and updated.

## 2023-04-22 NOTE — Progress Notes (Signed)
ANTICOAGULATION CONSULT NOTE - Initial Consult  Pharmacy Consult for Heparin Indication:  multivessel CAD  No Known Allergies  Patient Measurements: Height:  (177.8 cm) Weight: 65.4 kg (144 lb 2.9 oz) IBW/kg (Calculated) : 73 Heparin Dosing Weight: 65 kg  Vital Signs: Temp: 98.1 F (36.7 C) (04/22 1116) Temp Source: Oral (04/22 1116) BP: 145/99 (04/22 1715) Pulse Rate: 72 (04/22 1715)  Labs: Recent Labs    04/20/23 0518 04/22/23 0841 04/22/23 1622 04/22/23 1629  HGB 15.6 14.6 15.0 15.0  HCT 46.6 42.9 44.0 44.0  PLT 154 152  --   --   CREATININE 1.03 1.04  --   --     Estimated Creatinine Clearance: 83.8 mL/min (by C-G formula based on SCr of 1.04 mg/dL).   Medical History: Past Medical History:  Diagnosis Date   Blood transfusion without reported diagnosis    Hypertension     Medications:  Medications Prior to Admission  Medication Sig Dispense Refill Last Dose   albuterol (VENTOLIN HFA) 108 (90 Base) MCG/ACT inhaler Inhale 2 puffs into the lungs every 6 (six) hours as needed for wheezing or shortness of breath. (Patient not taking: Reported on 04/19/2023) 18 g 2 Not Taking   amLODipine (NORVASC) 10 MG tablet Take 1 tablet (10 mg total) by mouth daily. (Patient not taking: Reported on 04/19/2023) 30 tablet 1 Not Taking   doxycycline (VIBRA-TABS) 100 MG tablet Take 1 tablet (100 mg total) by mouth 2 (two) times daily. (Patient not taking: Reported on 04/19/2023) 8 tablet 0 Completed Course   Fluticasone-Salmeterol (ADVAIR DISKUS) 250-50 MCG/DOSE AEPB Inhale 1 puff into the lungs 2 (two) times daily. (Patient not taking: Reported on 04/19/2023) 60 each 2 Not Taking   predniSONE (DELTASONE) 20 MG tablet 3 tablets by mouth daily x1 day; then 2 tablets by mouth daily x2 days; then 1 tablet by mouth daily x3 days; day and half dollar by mouth daily x3 days stop prednisone. (Patient not taking: Reported on 04/19/2023) 12 tablet 0 Completed Course    spironolactone-hydrochlorothiazide (ALDACTAZIDE) 25-25 MG tablet Take 1 tablet by mouth daily. (Patient not taking: Reported on 04/19/2023) 30 tablet 1 Not Taking    Assessment: 47 y.o. M presents with elevated trop and acute HFrEF. S/p cath which showed multivessel CAD. Cardiac surgery consulted for CABG. To start heparin 2 hours post TR band removal. TR band removed at 2000. CBC stable on admission.  Goal of Therapy:  Heparin level 0.3-0.7 units/ml Monitor platelets by anticoagulation protocol: Yes   Plan:  At 2200, start heparin 1050 units/hr F/u 6 hr heparin level  Christoper Fabian, PharmD, BCPS Please see amion for complete clinical pharmacist phone list 04/22/2023,5:22 PM

## 2023-04-23 ENCOUNTER — Inpatient Hospital Stay (HOSPITAL_COMMUNITY): Payer: Medicaid Other

## 2023-04-23 ENCOUNTER — Other Ambulatory Visit (HOSPITAL_COMMUNITY): Payer: Self-pay

## 2023-04-23 ENCOUNTER — Encounter (HOSPITAL_COMMUNITY): Payer: Self-pay | Admitting: Internal Medicine

## 2023-04-23 DIAGNOSIS — Z01810 Encounter for preprocedural cardiovascular examination: Secondary | ICD-10-CM | POA: Diagnosis not present

## 2023-04-23 DIAGNOSIS — I161 Hypertensive emergency: Secondary | ICD-10-CM | POA: Diagnosis not present

## 2023-04-23 DIAGNOSIS — E440 Moderate protein-calorie malnutrition: Secondary | ICD-10-CM | POA: Insufficient documentation

## 2023-04-23 DIAGNOSIS — I214 Non-ST elevation (NSTEMI) myocardial infarction: Secondary | ICD-10-CM | POA: Diagnosis not present

## 2023-04-23 DIAGNOSIS — I2510 Atherosclerotic heart disease of native coronary artery without angina pectoris: Secondary | ICD-10-CM

## 2023-04-23 DIAGNOSIS — I255 Ischemic cardiomyopathy: Secondary | ICD-10-CM

## 2023-04-23 LAB — COMPREHENSIVE METABOLIC PANEL
ALT: 13 U/L (ref 0–44)
AST: 15 U/L (ref 15–41)
Albumin: 3.7 g/dL (ref 3.5–5.0)
Alkaline Phosphatase: 49 U/L (ref 38–126)
Anion gap: 11 (ref 5–15)
BUN: 17 mg/dL (ref 6–20)
CO2: 20 mmol/L — ABNORMAL LOW (ref 22–32)
Calcium: 9.2 mg/dL (ref 8.9–10.3)
Chloride: 105 mmol/L (ref 98–111)
Creatinine, Ser: 0.97 mg/dL (ref 0.61–1.24)
GFR, Estimated: >60 mL/min (ref >60)
Glucose, Bld: 104 mg/dL — ABNORMAL HIGH (ref 70–99)
Potassium: 4.2 mmol/L (ref 3.5–5.1)
Sodium: 136 mmol/L (ref 135–145)
Total Bilirubin: 1.1 mg/dL (ref 0.3–1.2)
Total Protein: 6.8 g/dL (ref 6.5–8.1)

## 2023-04-23 LAB — PULMONARY FUNCTION TEST
FEF 25-75 Pre: 3.57 L/sec
FEF2575-%Pred-Pre: 94 %
FEV1-%Pred-Pre: 85 %
FEV1-Pre: 3.51 L
FEV1FVC-%Pred-Pre: 103 %
FEV6-%Pred-Pre: 85 %
FEV6-Pre: 4.31 L
FEV6FVC-%Pred-Pre: 102 %
FVC-%Pred-Pre: 82 %
FVC-Pre: 4.31 L
Pre FEV1/FVC ratio: 82 %
Pre FEV6/FVC Ratio: 100 %

## 2023-04-23 LAB — CBC
HCT: 45.6 % (ref 39.0–52.0)
Hemoglobin: 15.7 g/dL (ref 13.0–17.0)
MCH: 31.8 pg (ref 26.0–34.0)
MCHC: 34.4 g/dL (ref 30.0–36.0)
MCV: 92.5 fL (ref 80.0–100.0)
Platelets: 142 10*3/uL — ABNORMAL LOW (ref 150–400)
RBC: 4.93 MIL/uL (ref 4.22–5.81)
RDW: 12.4 % (ref 11.5–15.5)
WBC: 8.6 10*3/uL (ref 4.0–10.5)
nRBC: 0 % (ref 0.0–0.2)

## 2023-04-23 LAB — HEPARIN LEVEL (UNFRACTIONATED)
Heparin Unfractionated: 0.21 IU/mL — ABNORMAL LOW (ref 0.30–0.70)
Heparin Unfractionated: 0.22 IU/mL — ABNORMAL LOW (ref 0.30–0.70)
Heparin Unfractionated: 0.41 IU/mL (ref 0.30–0.70)

## 2023-04-23 LAB — MAGNESIUM: Magnesium: 2.1 mg/dL (ref 1.7–2.4)

## 2023-04-23 LAB — SURGICAL PCR SCREEN
MRSA, PCR: NEGATIVE
Staphylococcus aureus: NEGATIVE

## 2023-04-23 LAB — TYPE AND SCREEN
ABO/RH(D): A POS
Antibody Screen: NEGATIVE

## 2023-04-23 LAB — ABO/RH: ABO/RH(D): A POS

## 2023-04-23 MED ORDER — ENSURE ENLIVE PO LIQD
237.0000 mL | Freq: Two times a day (BID) | ORAL | Status: DC
  Administered 2023-04-23 – 2023-04-24 (×3): 237 mL via ORAL

## 2023-04-23 MED ORDER — ADULT MULTIVITAMIN W/MINERALS CH
1.0000 | ORAL_TABLET | Freq: Every day | ORAL | Status: DC
  Administered 2023-04-23 – 2023-04-24 (×2): 1 via ORAL
  Filled 2023-04-23 (×2): qty 1

## 2023-04-23 NOTE — TOC Benefit Eligibility Note (Signed)
Patient Product/process development scientist completed.    The patient is currently admitted and upon discharge could be taking Entresto 24-26 mg.  The current 30 day co-pay is $4.00.   The patient is currently admitted and upon discharge could be taking Farxiga 10 mg.  Requires Prior Authorization   The patient is currently admitted and upon discharge could be taking Jardiance 10 mg.  Requires Prior Authorization   The patient is insured through [employer] [place] Medicaid   This test claim was processed through [facility]- copay amounts may vary at other pharmacies due to pharmacy/plan contracts, or as the patient moves through the different stages of their insurance plan.  Roland Earl, CPHT Pharmacy Patient Advocate Specialist [facility] Patient Advocate Team Direct Number: [phone]  Fax: [phone]

## 2023-04-23 NOTE — Progress Notes (Signed)
ANTICOAGULATION CONSULT NOTE   Pharmacy Consult for Heparin Indication:  multivessel CAD  No Known Allergies  Patient Measurements: Height:  (177.8 cm) Weight: 65.4 kg (144 lb 2.9 oz) IBW/kg (Calculated) : 73 Heparin Dosing Weight: 65 kg  Vital Signs: Temp: 98.1 F (36.7 C) (04/23 0352) Temp Source: Oral (04/23 0352) BP: 125/77 (04/23 0352) Pulse Rate: 76 (04/23 0352)  Labs: Recent Labs    04/22/23 0841 04/22/23 1622 04/22/23 1629 04/23/23 0602  HGB 14.6 15.0 15.0 15.7  HCT 42.9 44.0 44.0 45.6  PLT 152  --   --  142*  HEPARINUNFRC  --   --   --  0.21*  CREATININE 1.04  --   --  0.97     Estimated Creatinine Clearance: 89.9 mL/min (by C-G formula based on SCr of 0.97 mg/dL).   Medical History: Past Medical History:  Diagnosis Date   Blood transfusion without reported diagnosis    Hypertension     Medications:  Medications Prior to Admission  Medication Sig Dispense Refill Last Dose   albuterol (VENTOLIN HFA) 108 (90 Base) MCG/ACT inhaler Inhale 2 puffs into the lungs every 6 (six) hours as needed for wheezing or shortness of breath. (Patient not taking: Reported on 04/19/2023) 18 g 2 Not Taking   amLODipine (NORVASC) 10 MG tablet Take 1 tablet (10 mg total) by mouth daily. (Patient not taking: Reported on 04/19/2023) 30 tablet 1 Not Taking   doxycycline (VIBRA-TABS) 100 MG tablet Take 1 tablet (100 mg total) by mouth 2 (two) times daily. (Patient not taking: Reported on 04/19/2023) 8 tablet 0 Completed Course   Fluticasone-Salmeterol (ADVAIR DISKUS) 250-50 MCG/DOSE AEPB Inhale 1 puff into the lungs 2 (two) times daily. (Patient not taking: Reported on 04/19/2023) 60 each 2 Not Taking   predniSONE (DELTASONE) 20 MG tablet 3 tablets by mouth daily x1 day; then 2 tablets by mouth daily x2 days; then 1 tablet by mouth daily x3 days; day and half dollar by mouth daily x3 days stop prednisone. (Patient not taking: Reported on 04/19/2023) 12 tablet 0 Completed Course    spironolactone-hydrochlorothiazide (ALDACTAZIDE) 25-25 MG tablet Take 1 tablet by mouth daily. (Patient not taking: Reported on 04/19/2023) 30 tablet 1 Not Taking    Assessment: 47 y.o. M presents with elevated trop and acute HFrEF. S/p cath which showed multivessel CAD. Cardiac surgery consulted for CABG. To start heparin 2 hours post TR band removal. TR band removed at 2000. CBC stable on admission.  4/23 AM update:  Heparin level sub-therapeutic  Goal of Therapy:  Heparin level 0.3-0.7 units/ml Monitor platelets by anticoagulation protocol: Yes   Plan:  Inc heparin to 1200 units/hr 1400 heparin level  Abran Duke, PharmD, BCPS Clinical Pharmacist Phone: [phone]

## 2023-04-23 NOTE — Progress Notes (Signed)
ANTICOAGULATION CONSULT NOTE   Pharmacy Consult for Heparin Indication:  multivessel CAD  No Known Allergies  Patient Measurements: Height:  (177.8 cm) Weight: 65.4 kg (144 lb 2.9 oz) IBW/kg (Calculated) : 73 Heparin Dosing Weight: 65 kg  Vital Signs: Temp: 98.5 F (36.9 C) (04/23 1146) Temp Source: Oral (04/23 1146) BP: 112/62 (04/23 1146) Pulse Rate: 76 (04/23 1400)  Labs: Recent Labs    04/22/23 0841 04/22/23 1622 04/22/23 1629 04/23/23 0602 04/23/23 1427  HGB 14.6 15.0 15.0 15.7  --   HCT 42.9 44.0 44.0 45.6  --   PLT 152  --   --  142*  --   HEPARINUNFRC  --   --   --  0.21* 0.22*  CREATININE 1.04  --   --  0.97  --     Estimated Creatinine Clearance: 89.9 mL/min (by C-G formula based on SCr of 0.97 mg/dL).   Medical History: Past Medical History:  Diagnosis Date   Blood transfusion without reported diagnosis    Hypertension     Medications:  Medications Prior to Admission  Medication Sig Dispense Refill Last Dose   albuterol (VENTOLIN HFA) 108 (90 Base) MCG/ACT inhaler Inhale 2 puffs into the lungs every 6 (six) hours as needed for wheezing or shortness of breath. (Patient not taking: Reported on 04/19/2023) 18 g 2 Not Taking   amLODipine (NORVASC) 10 MG tablet Take 1 tablet (10 mg total) by mouth daily. (Patient not taking: Reported on 04/19/2023) 30 tablet 1 Not Taking   doxycycline (VIBRA-TABS) 100 MG tablet Take 1 tablet (100 mg total) by mouth 2 (two) times daily. (Patient not taking: Reported on 04/19/2023) 8 tablet 0 Completed Course   Fluticasone-Salmeterol (ADVAIR DISKUS) 250-50 MCG/DOSE AEPB Inhale 1 puff into the lungs 2 (two) times daily. (Patient not taking: Reported on 04/19/2023) 60 each 2 Not Taking   predniSONE (DELTASONE) 20 MG tablet 3 tablets by mouth daily x1 day; then 2 tablets by mouth daily x2 days; then 1 tablet by mouth daily x3 days; day and half dollar by mouth daily x3 days stop prednisone. (Patient not taking: Reported on  04/19/2023) 12 tablet 0 Completed Course   spironolactone-hydrochlorothiazide (ALDACTAZIDE) 25-25 MG tablet Take 1 tablet by mouth daily. (Patient not taking: Reported on 04/19/2023) 30 tablet 1 Not Taking    Assessment: 47 y.o. M presents with elevated trop and acute HFrEF. S/p cath which showed multivessel CAD. Cardiac surgery consulted for CABG. To start heparin 2 hours post TR band removal. TR band removed at 2000. CBC stable on admission.  Heparin level 0.22 after increase to 1200 units/hr.  H/H is stable. Platelets down some at 142.   Goal of Therapy:  Heparin level 0.3-0.7 units/ml Monitor platelets by anticoagulation protocol: Yes   Plan:  Increase Heparin to 1400 units/hr (14 mL/hr).  Repeat heparin level in 6 hours.  Daily heparin level and CBC.   Caleb Perkins, PharmD, BCPS, BCCCP Clinical Pharmacist Please refer to [facility] for [facility] Pharmacy numbers 04/23/2023, 3:27 PM

## 2023-04-23 NOTE — Progress Notes (Signed)
[facility]                                                                               Caleb Perkins, is a 47 y.o. male, DOB - 06/13/1976, AVW:[id number] Admit date - 04/19/2023    Outpatient Primary MD for the patient is Patient, No Pcp Per  LOS - 4  days    Brief summary   47 y.o. male, with history of hypertension, noncompliant with home medications who has not taken his medications for the last 1-1/2 years, came to hospital with worsening shortness of breath and chest pain. In the ED he was found to be in hypertensive emergency with elevated troponin, BNP. Patient was admitted with acute diastolic CHF exacerbation in the setting of hypertensive emergency . He was transferred to [facility] for cardiac catheterization.    Assessment & Plan    Assessment and Plan:  Acute systolic and diastolic heart failure/ ischemic cardiomyopathy Echocardiogram this admission showed LVEF 35 to 40% global hypokinesis, grade 2 diastolic dysfunction, normal RV size and function, mild biatrial enlargement,.  Continue with coreg 12.5 mg BID, entresto 24-26 mg BID, spironolactone 25 mg daily.    Elevated troponins,patient  underwent cardiac catheterization on 4/22 showing severe three vessel CAD, . He remains on IV heparin, aspirin, coreg, imdur and entresto and spironolactone.  Cardiothoracic surgery consulted for further recommendations.  Pulmonary function tests done.    Hypertensive emergency Secondary to non compliance to medications.  BP parameters have improved.    Hyperlipidemia:  Resume statin.    Mild thrombocytopenia.    Estimated body mass index is 20.69 kg/m as calculated from the following:   Height as of this encounter:  (1.778 m).   Weight as of this encounter: 65.4 kg.  Code Status: FULL CODE.  DVT Prophylaxis:     Level of Care: Level of care: Progressive Cardiac Family Communication: none at bedside. Discussed the plan with the patient.    Disposition Plan:     Remains inpatient appropriate: cardiothoracic surgery evaluation.   Procedures:  Cardiac catheterization   Consultants:   Cardiology Cardiothoracic surgery.   Antimicrobials:   Anti-infectives (From admission, onward)    None        Medications  Scheduled Meds:  amLODipine  10 mg Oral Daily   aspirin EC  81 mg Oral Daily   atorvastatin  40 mg Oral Daily   carvedilol  12.5 mg Oral BID WC   Chlorhexidine Gluconate Cloth  6 each Topical Daily   isosorbide mononitrate  30 mg Oral Daily   sacubitril-valsartan  1 tablet Oral BID   sodium chloride flush  3 mL Intravenous Q12H   sodium chloride flush  3 mL Intravenous Q12H   spironolactone  25 mg Oral Daily   Continuous Infusions:  sodium chloride     sodium chloride Stopped (04/23/23 0825)   sodium chloride     heparin 1,200 Units/hr (04/23/23 1000)   PRN Meds:.sodium chloride, sodium chloride, acetaminophen **OR** acetaminophen, hydrALAZINE, ondansetron **OR** ondansetron (ZOFRAN) IV, sodium chloride flush, sodium chloride flush    Subjective:   Caleb Perkins was seen and examined today.  No chest pain or sob.  Objective:   Vitals:   04/23/23 0352 04/23/23 0800 04/23/23 0900 04/23/23 1000  BP: 125/77 (!) 137/101    Pulse: 76 73 81 87  Resp: 18 18    Temp: 98.1 F (36.7 C)     TempSrc: Oral     SpO2: 98% 98% 98% 97%  Weight:      Height:        Intake/Output Summary (Last 24 hours) at 04/23/2023 1035 Last data filed at 04/23/2023 1000 Gross per 24 hour  Intake 881.34 ml  Output 1250 ml  Net -368.66 ml   Filed Weights   04/21/23 0500 04/22/23 0500 04/22/23 1237  Weight: 66.2 kg 66.2 kg 65.4 kg     Exam General exam: Appears calm and comfortable  Respiratory system: Clear to auscultation. Respiratory effort normal. Cardiovascular system: S1 & S2 heard, RRR. Gastrointestinal system: Abdomen is nondistended, soft and nontender.  Central nervous system: Alert and oriented.  No focal neurological deficits. Extremities: Symmetric 5 x 5 power. Skin: No rashes, lesions or ulcers Psychiatry: mood is appropriate.    Data Reviewed:  I have personally reviewed following labs and imaging studies   CBC Lab Results  Component Value Date   WBC 8.6 04/23/2023   RBC 4.93 04/23/2023   HGB 15.7 04/23/2023   HCT 45.6 04/23/2023   MCV 92.5 04/23/2023   MCH 31.8 04/23/2023   PLT 142 (L) 04/23/2023   MCHC 34.4 04/23/2023   RDW 12.4 04/23/2023   LYMPHSABS 1.4 04/19/2023   MONOABS 0.5 04/19/2023   EOSABS 0.3 04/19/2023   BASOSABS 0.1 04/19/2023     Last metabolic panel Lab Results  Component Value Date   NA 136 04/23/2023   K 4.2 04/23/2023   CL 105 04/23/2023   CO2 20 (L) 04/23/2023   BUN 17 04/23/2023   CREATININE 0.97 04/23/2023   GLUCOSE 104 (H) 04/23/2023   GFRNONAA >60 04/23/2023   GFRAA >60 11/10/2019   CALCIUM 9.2 04/23/2023   PROT 6.8 04/23/2023   ALBUMIN 3.7 04/23/2023   BILITOT 1.1 04/23/2023   ALKPHOS 49 04/23/2023   AST 15 04/23/2023   ALT 13 04/23/2023   ANIONGAP 11 04/23/2023    CBG (last 3)  No results for input(s): "GLUCAP" in the last 72 hours.    Coagulation Profile: No results for input(s): "INR", "PROTIME" in the last 168 hours.   Radiology Studies: CARDIAC CATHETERIZATION  Result Date: 04/22/2023 Conclusions: Severe three-vessel coronary artery disease, as detailed below, including sequential 95% ostial and 80-90% mid LAD stenoses, 60% OM1 lesion, 70-80% mid/distal LCx disease, and chronic total occlusion of proximal/mid RCA with left-to-right and right-to-right collaterals. Moderately reduced left ventricular systolic function (LVEF 35-45%). Normal left heart, right heart, and pulmonary artery pressures. Normal Fick cardiac output/index. Recommendations: Gentle post-catheterization hydration; aim for net even fluid balance. Escalate goal directed medical therapy for acute HFrEF; I will add Entresto 24-26 mg BID beginning  this evening. Cardiac surgery consultation for CABG. Aggressive secondary prevention of coronary artery disease. Start heparin infusion 2 hours after TR band removal in the setting of severe multivessel CAD and elevated troponin this admission. Yvonne Kendall, MD [facility]      Kathlen Mody M.D. [facility] 04/23/2023, 10:35 AM  Available via Epic secure chat 7am-7pm After 7 pm, please refer to night coverage provider listed on amion.

## 2023-04-23 NOTE — Progress Notes (Signed)
CARDIAC REHAB PHASE I     Pre-op education including OHS booklet, OHS handout, sternal precautions, IS use and mobility post op reviewed. Will continue to follow.   [id number] Woodroe Chen, RN BSN 04/23/2023 1:47 PM

## 2023-04-23 NOTE — Progress Notes (Signed)
Initial Nutrition Assessment  DOCUMENTATION CODES:   Non-severe (moderate) malnutrition in context of chronic illness  INTERVENTION:  Ensure Plus High Protein po BID, each supplement provides 350 kcal and 20 grams of protein. MVI  Diet education  Provide handouts from NCM/AND    NUTRITION DIAGNOSIS:   Moderate Malnutrition related to chronic illness as evidenced by mild fat depletion, moderate muscle depletion, severe fat depletion, mild muscle depletion, moderate fat depletion, severe muscle depletion.   GOAL:   Patient will meet greater than or equal to 90% of their needs   MONITOR:   PO intake, Labs, I & O's, Supplement acceptance, Weight trends, Skin  REASON FOR ASSESSMENT:   Malnutrition Screening Tool    ASSESSMENT:  47 y.o. male with PMHx including HTN, tobacco and marijuana abuse, and noncompliance taking meds x 1.64yrs presents with worsening SOB and chest pain. Patient c/o dyspnea on exertion, intermittent N/V, L sided chest pain. Patient admitted for hypertensive emergency  S/p R/L heart cath and coronary angiography   Works at [employer] and eats there daily   Labs:  Glu 104, hga1c 5.2 Meds: NS, aldactone  Wt: admit stated weight- 175#, actual BW- 144#  -limited wt hx, 30# (16%) wt loss x 8 months PO: 100% meal intake x last 6 documented meals  I/O's:  -2.6 L   Visited patient at bedside who reports much improved appetite and eating well. He reports liking the food here. Patient reports that he was not feeling well and began to eat less starting around 8 months ago. He reports his UBW around 180# x 8 months ago.   Patient reports eating fast food a lot PTA but is ready for a lifestyle change and wants to follow a heart healthy diet. Patient acceptable to heart healthy education, ONS, and MVI.    NUTRITION - FOCUSED PHYSICAL EXAM:  Flowsheet Row Most Recent Value  Orbital Region Moderate depletion  Upper Arm Region Mild depletion  Thoracic and  Lumbar Region Mild depletion  Buccal Region Severe depletion  Temple Region Moderate depletion  Clavicle Bone Region Severe depletion  Clavicle and Acromion Bone Region Moderate depletion  Scapular Bone Region Unable to assess  Dorsal Hand Moderate depletion  Patellar Region Mild depletion  Anterior Thigh Region Mild depletion  Posterior Calf Region Mild depletion  Edema (RD Assessment) None  Hair Reviewed  Eyes Reviewed  Mouth Reviewed  Skin Reviewed  Nails Reviewed       Diet Order:   Diet Order             Diet NPO time specified  Diet effective midnight           Diet Carb Modified Fluid consistency: Thin; Room service appropriate? Yes; Fluid restriction: 2000 mL Fluid  Diet effective now                   EDUCATION NEEDS:   Education needs have been addressed  Skin:  Skin Assessment: Reviewed RN Assessment  Last BM:  4/23  Height:   Ht Readings from Last 1 Encounters:  04/19/23  (1.778 m)    Weight:   Wt Readings from Last 1 Encounters:  04/22/23 65.4 kg    BMI:  Body mass index is 20.69 kg/m.  Estimated Nutritional Needs:   Kcal:  1900-2100 kcal  Protein:  80-100 g  Fluid:  >/= 2L    Leodis Rains, RDN, LDN  Clinical Nutrition

## 2023-04-23 NOTE — Consult Note (Addendum)
[address]       [place] [address]             [phone]        Caleb Perkins [facility] Medical Record #[id number] Date of Birth: 08-30-1976  Referring: Dr. Okey Dupre, MD Primary Care: Patient, No Pcp Per Primary Cardiologist:Branch, Christiane Ha, MD  Chief Complaint:    Chief Complaint  Patient presents with   Chest Pain and worsening shortness of breath   History of Present Illness:     This is a 47 year old male with a past medical history of hypertension, tobacco abuse, marijuana abuse who presented to [facility] ED with worsening shortness of breath and chest pain 04/19/2023. He denies syncope, diaphoresis, but has had intermittent ankle edema. Of note, he has been non compliant with medications for over one year. He was found to have a hypertensive emergency (BP 228/156), Troponin I (high sensitivity) max 102, and BNP 495. He was given IV Labetalol and cardiology was consulted.  CTA showed no evidence of aortic dissection but was noted to have mild dilation of the ascending aorta measuring 4.1 cm and  Interlobular septal thickening in both lungs with a small right pleural effusion and patchy ground-glass opacities in the right upper and lower lobes. Echo done 04/19/2023 showed LVEF 35-40%, LV with global hypokinesis, mild MR, mild AI, mild TR, and aortic dilatation 40 mm.Cardiac catheterization done 04/22/2023 showed ostial LAD to proximal LAD lesion is 95% stenosed, mid LAD with 85% stenosis, mid to distal Circumflex with a 75% stenosis, and proximal to distal RCA with 100% stenosis. Cardiothoracic consultation was requested for consideration of coronary artery disease.  Patient works at [employer] and eats there daily(has a high salt diet.) Per patient, he was previously able to walk for several miles a day but now gets short of breath with walking with minimal distance. At the time of my exam, he denies chest pain.   Current Activity/ Functional Status: Patient is  independent with mobility/ambulation, transfers, ADL's, IADL's.   Zubrod Score: At the time of surgery this patient's most appropriate activity status/level should be described as:     0    Normal activity, no symptoms     1    Restricted in physical strenuous activity but ambulatory, able to do out light work     2    Ambulatory and capable of self care, unable to do work activities, up and about more than 50%  Of the time                                3    Only limited self care, in bed greater than 50% of waking hours     4    Completely disabled, no self care, confined to bed or chair     5    Moribund  Past Medical History: Past Medical History:  Diagnosis Date   Blood transfusion without reported diagnosis    Hypertension    Past Surgical History: Past Surgical History:  Procedure Laterality Date   RIGHT/LEFT HEART CATH AND CORONARY ANGIOGRAPHY N/A 04/22/2023   Procedure: RIGHT/LEFT HEART CATH AND CORONARY ANGIOGRAPHY;  Surgeon: Yvonne Kendall, MD;  Location: [facility] INVASIVE CV LAB;  Service: Cardiovascular;  Laterality: N/A;   Social History: Social History   Tobacco Use  Smoking Status Every Day   Packs/day: .5   Types: Cigarettes  Smokeless Tobacco Never  Daily Marijuana use  Social History   Substance and Sexual Activity  Alcohol Use Not Currently    Allergies: No Known Allergies  Current Facility-Administered Medications  Medication Dose Route Frequency Provider Last Rate Last Admin   0.9 %  sodium chloride infusion  250 mL Intravenous PRN Dunn, Dayna N, PA-C       0.9 %  sodium chloride infusion   Intravenous Continuous Dunn, Dayna N, PA-C   Stopped at 04/23/23 0825   0.9 %  sodium chloride infusion  250 mL Intravenous PRN End, Cristal Deer, MD       acetaminophen (TYLENOL) tablet 650 mg  650 mg Oral Q6H PRN Ronie Spies N, PA-C   650 mg at 04/22/23 1236   Or   acetaminophen (TYLENOL) suppository 650 mg  650 mg Rectal Q6H PRN Dunn, Dayna N, PA-C        amLODipine (NORVASC) tablet 10 mg  10 mg Oral Daily Dunn, Dayna N, PA-C   10 mg at 04/23/23 1610   aspirin EC tablet 81 mg  81 mg Oral Daily Dunn, Dayna N, PA-C   81 mg at 04/23/23 0807   atorvastatin (LIPITOR) tablet 40 mg  40 mg Oral Daily Dunn, Dayna N, PA-C   40 mg at 04/23/23 9604   carvedilol (COREG) tablet 12.5 mg  12.5 mg Oral BID WC Dunn, Dayna N, PA-C   12.5 mg at 04/23/23 5409   Chlorhexidine Gluconate Cloth 2 % PADS 6 each  6 each Topical Daily Dunn, Dayna N, PA-C   6 each at 04/23/23 0829   heparin ADULT infusion 100 units/mL (25000 units/27mL)  1,200 Units/hr Intravenous Continuous Stevphen Rochester, RPH 12 mL/hr at 04/23/23 0800 1,200 Units/hr at 04/23/23 0800   hydrALAZINE (APRESOLINE) tablet 25 mg  25 mg Oral Q6H PRN Ronie Spies N, PA-C   25 mg at 04/20/23 1507   isosorbide mononitrate (IMDUR) 24 hr tablet 30 mg  30 mg Oral Daily Dunn, Dayna N, PA-C   30 mg at 04/23/23 0807   ondansetron (ZOFRAN) tablet 4 mg  4 mg Oral Q6H PRN Dunn, Dayna N, PA-C       Or   ondansetron (ZOFRAN) injection 4 mg  4 mg Intravenous Q6H PRN Dunn, Dayna N, PA-C       sacubitril-valsartan (ENTRESTO) 24-26 mg per tablet  1 tablet Oral BID End, Cristal Deer, MD   1 tablet at 04/23/23 8119   sodium chloride flush (NS) 0.9 % injection 3 mL  3 mL Intravenous Q12H Dunn, Dayna N, PA-C   3 mL at 04/23/23 0829   sodium chloride flush (NS) 0.9 % injection 3 mL  3 mL Intravenous PRN Dunn, Dayna N, PA-C       sodium chloride flush (NS) 0.9 % injection 3 mL  3 mL Intravenous Q12H End, Christopher, MD   3 mL at 04/23/23 0825   sodium chloride flush (NS) 0.9 % injection 3 mL  3 mL Intravenous PRN End, Cristal Deer, MD       spironolactone (ALDACTONE) tablet 25 mg  25 mg Oral Daily Dunn, Dayna N, PA-C   25 mg at 04/23/23 0807    Medications Prior to Admission  Medication Sig Dispense Refill Last Dose   albuterol (VENTOLIN HFA) 108 (90 Base) MCG/ACT inhaler Inhale 2 puffs into the lungs every 6 (six) hours as needed  for wheezing or shortness of breath. (Patient not taking: Reported on 04/19/2023) 18 g 2 Not Taking   amLODipine (NORVASC)  10 MG tablet Take 1 tablet (10 mg total) by mouth daily. (Patient not taking: Reported on 04/19/2023) 30 tablet 1 Not Taking   doxycycline (VIBRA-TABS) 100 MG tablet Take 1 tablet (100 mg total) by mouth 2 (two) times daily. (Patient not taking: Reported on 04/19/2023) 8 tablet 0 Completed Course   Fluticasone-Salmeterol (ADVAIR DISKUS) 250-50 MCG/DOSE AEPB Inhale 1 puff into the lungs 2 (two) times daily. (Patient not taking: Reported on 04/19/2023) 60 each 2 Not Taking   predniSONE (DELTASONE) 20 MG tablet 3 tablets by mouth daily x1 day; then 2 tablets by mouth daily x2 days; then 1 tablet by mouth daily x3 days; day and half dollar by mouth daily x3 days stop prednisone. (Patient not taking: Reported on 04/19/2023) 12 tablet 0 Completed Course   spironolactone-hydrochlorothiazide (ALDACTAZIDE) 25-25 MG tablet Take 1 tablet by mouth daily. (Patient not taking: Reported on 04/19/2023) 30 tablet 1 Not Taking   Family History: Family History  Problem Relation Age of Onset   Hypertension, breast cancer, CAD-stent Mother   Father has heart problems Both mother and father are alive  Review of Systems:    Cardiac Review of Systems: Y or  [  N  ]= no  Chest Pain [  upon admission  ]  Resting SOB [ N  ] Exertional SOB  [ Y ]  Orthopnea [ N ]   Pedal Edema [  N ]    Syncope  [N  ]    General Review of Systems: [Y] = yes [N  ]=no Constitional:  nausea [ N ]; night sweats [  ]; fever [  N]; or chills [  N]                                                               Dental: Last Dentist visit: Unknown  Eye : diplopia [  N ];  Amaurosis fugax[ N ]; Resp: cough [ N ];  wheezing[N  ];  hemoptysis[  N]; GI:  vomiting[N  ];  d melena[ N ];  hematochezia Klaus.Mock  ];  GU: hematuria[ N ];                Skin: rash, swelling[N  ];,   Heme/Lymph: bruising[  ];  bleeding[  ];  anemia[  ];  Neuro:  TIA[ N ];   stroke[ N ];   seizures[ N ];    difficulty walking[ N ];  Endocrine: diabetes[ N ];  thyroid dysfunction[ N ];            Physical Exam: BP (!) 137/101 (BP Location: Right Arm)   Pulse 73   Temp 98.1 F (36.7 C) (Oral)   Resp 18   Ht 5\' 10"  (1.778 m)   Wt 65.4 kg   SpO2 98%   BMI 20.69 kg/m    General appearance: alert, cooperative, and no distress Head: Normocephalic, without obvious abnormality, atraumatic Neck: no carotid bruit, no JVD, and supple, symmetrical, trachea midline Resp: clear to auscultation bilaterally Cardio: RRR, no murmur GI: Soft, non tender, bowel sounds present Extremities: No LE edema Neurologic: Grossly normal  Diagnostic Studies & Laboratory data:     Recent Radiology Findings:  Echo done 04/19/2023: IMPRESSIONS   1. Left ventricular ejection fraction, by estimation, is  35 to 40%. The  left ventricle has moderately decreased function. The left ventricle  demonstrates global hypokinesis. There is moderate left ventricular  hypertrophy. Left ventricular diastolic  parameters are consistent with Grade II diastolic dysfunction  (pseudonormalization). Elevated left atrial pressure.   2. Right ventricular systolic function is normal. The right ventricular  size is normal. There is severely elevated pulmonary artery systolic  pressure.   3. Left atrial size was mildly dilated.   4. Right atrial size was mildly dilated.   5. The mitral valve is abnormal. Mild mitral valve regurgitation. No  evidence of mitral stenosis.   6. The tricuspid valve is abnormal.   7. The aortic valve is tricuspid. Aortic valve regurgitation is mild. No  aortic stenosis is present.   8. Aortic dilatation noted. There is mild dilatation of the ascending  aorta, measuring 40 mm.   9. The inferior vena cava is dilated in size with <50% respiratory  variability, suggesting right atrial pressure of 15 mmHg.   FINDINGS   Left Ventricle: Left ventricular ejection  fraction, by estimation, is 35  to 40%. The left ventricle has moderately decreased function. The left  ventricle demonstrates global hypokinesis. The left ventricular internal  cavity size was normal in size.  There is moderate left ventricular hypertrophy. Left ventricular diastolic  parameters are consistent with Grade II diastolic dysfunction  (pseudonormalization). Elevated left atrial pressure.   Right Ventricle: The right ventricular size is normal. Right vetricular  wall thickness was not well visualized. Right ventricular systolic  function is normal. There is severely elevated pulmonary artery systolic  pressure. The tricuspid regurgitant  velocity is 3.72 m/s, and with an assumed right atrial pressure of 15  mmHg, the estimated right ventricular systolic pressure is 70.4 mmHg.   Left Atrium: Left atrial size was mildly dilated.   Right Atrium: Right atrial size was mildly dilated.   Pericardium: There is no evidence of pericardial effusion.   Mitral Valve: The mitral valve is abnormal. Mild mitral valve  regurgitation. No evidence of mitral valve stenosis.   Tricuspid Valve: The tricuspid valve is abnormal. Tricuspid valve  regurgitation is mild . No evidence of tricuspid stenosis.   Aortic Valve: The aortic valve is tricuspid. Aortic valve regurgitation is  mild. No aortic stenosis is present. Aortic valve mean gradient measures  1.4 mmHg. Aortic valve peak gradient measures 3.1 mmHg. Aortic valve area,  by VTI measures 5.54 cm.   Pulmonic Valve: The pulmonic valve was not well visualized. Pulmonic valve  regurgitation is not visualized. No evidence of pulmonic stenosis.   Aorta: The aortic root is normal in size and structure and aortic  dilatation noted. There is mild dilatation of the ascending aorta,  measuring 40 mm.   Venous: The inferior vena cava is dilated in size with less than 50%  respiratory variability, suggesting right atrial pressure of 15 mmHg.    IAS/Shunts: No atrial level shunt detected by color flow Doppler.     CARDIAC CATHETERIZATION  Result Date: 04/22/2023 Conclusions: Severe three-vessel coronary artery disease, as detailed below, including sequential 95% ostial and 80-90% mid LAD stenoses, 60% OM1 lesion, 70-80% mid/distal LCx disease, and chronic total occlusion of proximal/mid RCA with left-to-right and right-to-right collaterals. Moderately reduced left ventricular systolic function (LVEF 35-45%). Normal left heart, right heart, and pulmonary artery pressures. Normal Fick cardiac output/index. Recommendations: Gentle post-catheterization hydration; aim for net even fluid balance. Escalate goal directed medical therapy for acute HFrEF; I will add Entresto 24-26  mg BID beginning this evening. Cardiac surgery consultation for CABG. Aggressive secondary prevention of coronary artery disease. Start heparin infusion 2 hours after TR band removal in the setting of severe multivessel CAD and elevated troponin this admission. Yvonne Kendall, MD [facility]    Diagnostic Dominance: Right    I have independently reviewed the above radiologic studies and discussed with the patient   Recent Lab Findings: Lab Results  Component Value Date   WBC 8.6 04/23/2023   HGB 15.7 04/23/2023   HCT 45.6 04/23/2023   PLT 142 (L) 04/23/2023   GLUCOSE 104 (H) 04/23/2023   CHOL 181 04/20/2023   TRIG 116 04/20/2023   HDL 35 (L) 04/20/2023   LDLCALC 123 (H) 04/20/2023   ALT 13 04/23/2023   AST 15 04/23/2023   NA 136 04/23/2023   K 4.2 04/23/2023   CL 105 04/23/2023   CREATININE 0.97 04/23/2023   BUN 17 04/23/2023   CO2 20 (L) 04/23/2023   TSH 0.963 04/20/2023   HGBA1C 5.2 04/20/2023   Assessment / Plan:   Coronary artery disease with depressed LVEF 35-40%-on Heparin drip. Would benefit from coronary artery bypass grafting surgery. Dr. Leafy Ro to evaluate and determine timing of surgery. If considering using radial artery as a  conduit, he is right hand dominant. History of hypertension-on Entresto, Amlodipine 10 mg daily, Coreg 12.5 mg bid, and Spironolactone 25 mg daily 3. History of tobacco abuse and marijuana use-urine drug screen positive for THC 4. Elevated LDL and low HDL-on Atorvastatin 40 mg daily 5. 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 Will arrange for yearly follow up. We discussed the importance of strict BP control (130/80 or less)  I  spent 20 minutes counseling the patient face to face.   Doree Fudge PA-C 04/23/2023 9:14 AM  Agree with above 47 yo male found to have ischemic cardiomyopathy with severe 3 VCAD. Pt would benefit from CABG with targets including LAD, DIAG, OM1 OM2 and PDA. Will look to use LIMA, Radial, and SVG. Pt had all the risks and goals and recovery from CABG explained and he understands and agrees. Will proceed tomorrow if OR room opens up

## 2023-04-23 NOTE — Progress Notes (Signed)
ANTICOAGULATION CONSULT NOTE   Pharmacy Consult for Heparin Indication:  multivessel CAD  No Known Allergies  Patient Measurements: Height:  (177.8 cm) Weight: 65.4 kg (144 lb 2.9 oz) IBW/kg (Calculated) : 73 Heparin Dosing Weight: 65 kg  Vital Signs: Temp: 98.8 F (37.1 C) (04/23 1946) Temp Source: Oral (04/23 1946) BP: 122/81 (04/23 1946) Pulse Rate: 81 (04/23 1946)  Labs: Recent Labs    04/22/23 0841 04/22/23 1622 04/22/23 1629 04/23/23 0602 04/23/23 1427 04/23/23 2027  HGB 14.6 15.0 15.0 15.7  --   --   HCT 42.9 44.0 44.0 45.6  --   --   PLT 152  --   --  142*  --   --   HEPARINUNFRC  --   --   --  0.21* 0.22* 0.41  CREATININE 1.04  --   --  0.97  --   --      Estimated Creatinine Clearance: 89.9 mL/min (by C-G formula based on SCr of 0.97 mg/dL).   Medical History: Past Medical History:  Diagnosis Date   Blood transfusion without reported diagnosis    Hypertension     Medications:  Medications Prior to Admission  Medication Sig Dispense Refill Last Dose   albuterol (VENTOLIN HFA) 108 (90 Base) MCG/ACT inhaler Inhale 2 puffs into the lungs every 6 (six) hours as needed for wheezing or shortness of breath. (Patient not taking: Reported on 04/19/2023) 18 g 2 Not Taking   amLODipine (NORVASC) 10 MG tablet Take 1 tablet (10 mg total) by mouth daily. (Patient not taking: Reported on 04/19/2023) 30 tablet 1 Not Taking   doxycycline (VIBRA-TABS) 100 MG tablet Take 1 tablet (100 mg total) by mouth 2 (two) times daily. (Patient not taking: Reported on 04/19/2023) 8 tablet 0 Completed Course   Fluticasone-Salmeterol (ADVAIR DISKUS) 250-50 MCG/DOSE AEPB Inhale 1 puff into the lungs 2 (two) times daily. (Patient not taking: Reported on 04/19/2023) 60 each 2 Not Taking   predniSONE (DELTASONE) 20 MG tablet 3 tablets by mouth daily x1 day; then 2 tablets by mouth daily x2 days; then 1 tablet by mouth daily x3 days; day and half dollar by mouth daily x3 days stop  prednisone. (Patient not taking: Reported on 04/19/2023) 12 tablet 0 Completed Course   spironolactone-hydrochlorothiazide (ALDACTAZIDE) 25-25 MG tablet Take 1 tablet by mouth daily. (Patient not taking: Reported on 04/19/2023) 30 tablet 1 Not Taking    Assessment: 47 y.o. M presents with elevated trop and acute HFrEF. S/p cath which showed multivessel CAD. Cardiac surgery consulted for CABG. To start heparin 2 hours post TR band removal. TR band removed at 2000. CBC stable on admission.  Heparin level therapeutic s/p rate increase to 1400 units/hr  Goal of Therapy:  Heparin level 0.3-0.7 units/ml Monitor platelets by anticoagulation protocol: Yes   Plan:  Continue heparin gtt at 1400 units/hr F/u heparin level with AM labs Daily heparin level, CBC, s/s bleeding  Daylene Posey, PharmD, [facility] Clinical Pharmacist ED Pharmacist Phone # [phone] 04/23/2023 9:37 PM

## 2023-04-23 NOTE — Progress Notes (Addendum)
Rounding Note    Patient Name: Caleb Perkins Date of Encounter: 04/23/2023  [facility] Cardiologist: Dina Rich, MD   Subjective   No chest pain.   Inpatient Medications    Scheduled Meds:  amLODipine  10 mg Oral Daily   aspirin EC  81 mg Oral Daily   atorvastatin  40 mg Oral Daily   carvedilol  12.5 mg Oral BID WC   Chlorhexidine Gluconate Cloth  6 each Topical Daily   isosorbide mononitrate  30 mg Oral Daily   sacubitril-valsartan  1 tablet Oral BID   sodium chloride flush  3 mL Intravenous Q12H   sodium chloride flush  3 mL Intravenous Q12H   spironolactone  25 mg Oral Daily   Continuous Infusions:  sodium chloride     sodium chloride Stopped (04/23/23 0825)   sodium chloride     heparin 1,200 Units/hr (04/23/23 0800)   PRN Meds: sodium chloride, sodium chloride, acetaminophen **OR** acetaminophen, hydrALAZINE, ondansetron **OR** ondansetron (ZOFRAN) IV, sodium chloride flush, sodium chloride flush   Vital Signs    Vitals:   04/22/23 2043 04/23/23 0009 04/23/23 0352 04/23/23 0800  BP: (!) 137/90 126/89 125/77 (!) 137/101  Pulse: 74 80 76 73  Resp:  18 18 18   Temp:  98.6 F (37 C) 98.1 F (36.7 C)   TempSrc:  Oral Oral   SpO2: 97% 97% 98% 98%  Weight:      Height:        Intake/Output Summary (Last 24 hours) at 04/23/2023 0906 Last data filed at 04/23/2023 0829 Gross per 24 hour  Intake 737.84 ml  Output 1250 ml  Net -512.16 ml      04/22/2023   12:37 PM 04/22/2023    5:00 AM 04/21/2023    5:00 AM  Last 3 Weights  Weight (lbs) 144 lb 2.9 oz 145 lb 15.1 oz 145 lb 15.1 oz  Weight (kg) 65.4 kg 66.2 kg 66.2 kg      Telemetry    Sinus Rhythm, short run of SVT - Personally Reviewed  ECG    No new tracing  Physical Exam   GEN: No acute distress.   Neck: No JVD Cardiac: RRR, no murmurs, rubs, or gallops.  Respiratory: Clear to auscultation bilaterally. GI: Soft, nontender, non-distended  MS: No edema; No deformity. Right  radial cath site stable.  Neuro:  Nonfocal  Psych: Normal affect   Labs    High Sensitivity Troponin:   Recent Labs  Lab 04/19/23 0741 04/19/23 0948  TROPONINIHS 78* 102*     Chemistry Recent Labs  Lab 04/19/23 0741 04/20/23 0518 04/22/23 0841 04/22/23 1622 04/22/23 1629 04/23/23 0602  NA 140 139 135 139 137 136  K 3.9 3.5 3.7 4.0 4.0 4.2  CL 107 104 103  --   --  105  CO2 23 25 22   --   --  20*  GLUCOSE 111* 97 109*  --   --  104*  BUN 19 16 19   --   --  17  CREATININE 1.08 1.03 1.04  --   --  0.97  CALCIUM 9.1 9.2 9.1  --   --  9.2  MG  --  2.0  --   --   --  2.1  PROT 7.5 7.0  --   --   --  6.8  ALBUMIN 4.4 4.1  --   --   --  3.7  AST 28 20  --   --   --  15  ALT 20 18  --   --   --  13  ALKPHOS 58 56  --   --   --  49  BILITOT 0.5 1.3*  --   --   --  1.1  GFRNONAA >60 >60 >60  --   --  >60  ANIONGAP 10 10 10   --   --  11    Lipids  Recent Labs  Lab 04/20/23 0518  CHOL 181  TRIG 116  HDL 35*  LDLCALC 123*  CHOLHDL 5.2    Hematology Recent Labs  Lab 04/20/23 0518 04/22/23 0841 04/22/23 1622 04/22/23 1629 04/23/23 0602  WBC 7.4 9.5  --   --  8.6  RBC 4.93 4.61  --   --  4.93  HGB 15.6 14.6 15.0 15.0 15.7  HCT 46.6 42.9 44.0 44.0 45.6  MCV 94.5 93.1  --   --  92.5  MCH 31.6 31.7  --   --  31.8  MCHC 33.5 34.0  --   --  34.4  RDW 12.6 12.4  --   --  12.4  PLT 154 152  --   --  142*   Thyroid  Recent Labs  Lab 04/20/23 0518  TSH 0.963    BNP Recent Labs  Lab 04/19/23 0741  BNP 495.0*    DDimer No results for input(s): "DDIMER" in the last 168 hours.   Radiology    CARDIAC CATHETERIZATION  Result Date: 04/22/2023 Conclusions: Severe three-vessel coronary artery disease, as detailed below, including sequential 95% ostial and 80-90% mid LAD stenoses, 60% OM1 lesion, 70-80% mid/distal LCx disease, and chronic total occlusion of proximal/mid RCA with left-to-right and right-to-right collaterals. Moderately reduced left ventricular  systolic function (LVEF 35-45%). Normal left heart, right heart, and pulmonary artery pressures. Normal Fick cardiac output/index. Recommendations: Gentle post-catheterization hydration; aim for net even fluid balance. Escalate goal directed medical therapy for acute HFrEF; I will add Entresto 24-26 mg BID beginning this evening. Cardiac surgery consultation for CABG. Aggressive secondary prevention of coronary artery disease. Start heparin infusion 2 hours after TR band removal in the setting of severe multivessel CAD and elevated troponin this admission. Yvonne Kendall, MD [facility]   Cardiac Studies   Cath: 04/22/2023  Conclusions: Severe three-vessel coronary artery disease, as detailed below, including sequential 95% ostial and 80-90% mid LAD stenoses, 60% OM1 lesion, 70-80% mid/distal LCx disease, and chronic total occlusion of proximal/mid RCA with left-to-right and right-to-right collaterals. Moderately reduced left ventricular systolic function (LVEF 35-45%). Normal left heart, right heart, and pulmonary artery pressures. Normal Fick cardiac output/index.   Recommendations: Gentle post-catheterization hydration; aim for net even fluid balance. Escalate goal directed medical therapy for acute HFrEF; I will add Entresto 24-26 mg BID beginning this evening. Cardiac surgery consultation for CABG. Aggressive secondary prevention of coronary artery disease. Start heparin infusion 2 hours after TR band removal in the setting of severe multivessel CAD and elevated troponin this admission.   Yvonne Kendall, MD [facility]  Diagnostic Dominance: Right  Echo: 04/19/2023  IMPRESSIONS     1. Left ventricular ejection fraction, by estimation, is 35 to 40%. The  left ventricle has moderately decreased function. The left ventricle  demonstrates global hypokinesis. There is moderate left ventricular  hypertrophy. Left ventricular diastolic  parameters are consistent with Grade II  diastolic dysfunction  (pseudonormalization). Elevated left atrial pressure.   2. Right ventricular systolic function is normal. The right ventricular  size is normal. There is severely  elevated pulmonary artery systolic  pressure.   3. Left atrial size was mildly dilated.   4. Right atrial size was mildly dilated.   5. The mitral valve is abnormal. Mild mitral valve regurgitation. No  evidence of mitral stenosis.   6. The tricuspid valve is abnormal.   7. The aortic valve is tricuspid. Aortic valve regurgitation is mild. No  aortic stenosis is present.   8. Aortic dilatation noted. There is mild dilatation of the ascending  aorta, measuring 40 mm.   9. The inferior vena cava is dilated in size with <50% respiratory  variability, suggesting right atrial pressure of 15 mmHg.   FINDINGS   Left Ventricle: Left ventricular ejection fraction, by estimation, is 35  to 40%. The left ventricle has moderately decreased function. The left  ventricle demonstrates global hypokinesis. The left ventricular internal  cavity size was normal in size.  There is moderate left ventricular hypertrophy. Left ventricular diastolic  parameters are consistent with Grade II diastolic dysfunction  (pseudonormalization). Elevated left atrial pressure.   Right Ventricle: The right ventricular size is normal. Right vetricular  wall thickness was not well visualized. Right ventricular systolic  function is normal. There is severely elevated pulmonary artery systolic  pressure. The tricuspid regurgitant  velocity is 3.72 m/s, and with an assumed right atrial pressure of 15  mmHg, the estimated right ventricular systolic pressure is 70.4 mmHg.   Left Atrium: Left atrial size was mildly dilated.   Right Atrium: Right atrial size was mildly dilated.   Pericardium: There is no evidence of pericardial effusion.   Mitral Valve: The mitral valve is abnormal. Mild mitral valve  regurgitation. No evidence of mitral  valve stenosis.   Tricuspid Valve: The tricuspid valve is abnormal. Tricuspid valve  regurgitation is mild . No evidence of tricuspid stenosis.   Aortic Valve: The aortic valve is tricuspid. Aortic valve regurgitation is  mild. No aortic stenosis is present. Aortic valve mean gradient measures  1.4 mmHg. Aortic valve peak gradient measures 3.1 mmHg. Aortic valve area,  by VTI measures 5.54 cm.   Pulmonic Valve: The pulmonic valve was not well visualized. Pulmonic valve  regurgitation is not visualized. No evidence of pulmonic stenosis.   Aorta: The aortic root is normal in size and structure and aortic  dilatation noted. There is mild dilatation of the ascending aorta,  measuring 40 mm.   Venous: The inferior vena cava is dilated in size with less than 50%  respiratory variability, suggesting right atrial pressure of 15 mmHg.   IAS/Shunts: No atrial level shunt detected by color flow Doppler.       Patient Profile     47 y.o. male with hx of HTN ("for most of my life"/on and off medication), tobacco abuse, THC use, prior homelessness (now has a residence) admitted with hypertensive emergency and acute CHF, found to have systolic dysfunction.   Assessment & Plan    CAD -- High-sensitivity troponin >>102.  Underwent cardiac catheterization noted above on 4/22 with severe three-vessel CAD, normal left, right heart and pulmonary artery pressures.  Recommendations for TCTS consult for CABG. -- Continue IV heparin, aspirin, carvedilol 12.5 mg twice daily, Imdur 30 mg daily, Entresto 24-26 mg twice daily, spironolactone 25 mg daily  HFrEF ICM -- LVEF of 35 to 40%, global hypokinesis, grade 2 diastolic dysfunction, normal RV size and function, mild biatrial enlargement, mild MR -- Normal pressures on cath -- GDMT: Continue Coreg 12.5 mg twice daily, Entresto 24-26 mg twice  daily, spironolactone 25 mg daily. Plan for SGLT2i post op  Hypertensive emergency -- Initial blood pressure  228/156 in the setting of noncompliance.  Significant improvement -- Continue Coreg 12.5 mg twice daily, Entresto 24-26 mg twice daily, spironolactone 25 mg daily, amlodipine 10 mg daily  HLD -- LDL 123, HDL 35 -- Crease atorvastatin to 80 mg daily   For questions or updates, please contact [facility] Please consult [email or web address] for contact info under        Signed, Laverda Page, NP  04/23/2023, 9:06 AM    I have personally seen and examined this patient. I agree with the assessment and plan as outlined above.  He is doing well today. No chest pain. Admitted with unstable angina/NSTEMI. Multi-vessel CAD by cath yesterday. Mild LV systolic dysfunction. CT surgery consult for CABG. Continue current medical therapy including Coreg, Imdur, Entresto, aldactone, ASA, statin and IV heparin.   Verne Carrow, MD, [facility] 04/23/2023 10:53 AM

## 2023-04-24 DIAGNOSIS — I2510 Atherosclerotic heart disease of native coronary artery without angina pectoris: Secondary | ICD-10-CM

## 2023-04-24 DIAGNOSIS — I161 Hypertensive emergency: Secondary | ICD-10-CM | POA: Diagnosis not present

## 2023-04-24 DIAGNOSIS — I255 Ischemic cardiomyopathy: Secondary | ICD-10-CM | POA: Diagnosis not present

## 2023-04-24 DIAGNOSIS — I214 Non-ST elevation (NSTEMI) myocardial infarction: Secondary | ICD-10-CM | POA: Diagnosis not present

## 2023-04-24 LAB — COMPREHENSIVE METABOLIC PANEL
ALT: 13 U/L (ref 0–44)
AST: 14 U/L — ABNORMAL LOW (ref 15–41)
Albumin: 3.5 g/dL (ref 3.5–5.0)
Alkaline Phosphatase: 51 U/L (ref 38–126)
Anion gap: 7 (ref 5–15)
BUN: 28 mg/dL — ABNORMAL HIGH (ref 6–20)
CO2: 23 mmol/L (ref 22–32)
Calcium: 9.1 mg/dL (ref 8.9–10.3)
Chloride: 108 mmol/L (ref 98–111)
Creatinine, Ser: 1.05 mg/dL (ref 0.61–1.24)
GFR, Estimated: >60 mL/min (ref >60)
Glucose, Bld: 106 mg/dL — ABNORMAL HIGH (ref 70–99)
Potassium: 4.1 mmol/L (ref 3.5–5.1)
Sodium: 138 mmol/L (ref 135–145)
Total Bilirubin: 0.9 mg/dL (ref 0.3–1.2)
Total Protein: 6.6 g/dL (ref 6.5–8.1)

## 2023-04-24 LAB — CBC
HCT: 45.6 % (ref 39.0–52.0)
Hemoglobin: 15.6 g/dL (ref 13.0–17.0)
MCH: 32 pg (ref 26.0–34.0)
MCHC: 34.2 g/dL (ref 30.0–36.0)
MCV: 93.6 fL (ref 80.0–100.0)
Platelets: 146 10*3/uL — ABNORMAL LOW (ref 150–400)
RBC: 4.87 MIL/uL (ref 4.22–5.81)
RDW: 12.4 % (ref 11.5–15.5)
WBC: 9 10*3/uL (ref 4.0–10.5)
nRBC: 0 % (ref 0.0–0.2)

## 2023-04-24 LAB — SARS CORONAVIRUS 2 (TAT 6-24 HRS): SARS Coronavirus 2: NEGATIVE

## 2023-04-24 LAB — HEPARIN LEVEL (UNFRACTIONATED): Heparin Unfractionated: 0.49 IU/mL (ref 0.30–0.70)

## 2023-04-24 LAB — PROTIME-INR
INR: 1.1 (ref 0.8–1.2)
Prothrombin Time: 14.3 seconds (ref 11.4–15.2)

## 2023-04-24 LAB — LIPOPROTEIN A (LPA): Lipoprotein (a): 61.7 nmol/L — ABNORMAL HIGH (ref <75.0)

## 2023-04-24 LAB — APTT: aPTT: 106 seconds — ABNORMAL HIGH (ref 24–36)

## 2023-04-24 MED ORDER — CEFAZOLIN SODIUM-DEXTROSE 2-4 GM/100ML-% IV SOLN
2.0000 g | INTRAVENOUS | Status: AC
  Administered 2023-04-25 (×2): 2 g via INTRAVENOUS
  Filled 2023-04-24: qty 100

## 2023-04-24 MED ORDER — MAGNESIUM SULFATE 50 % IJ SOLN
40.0000 meq | INTRAMUSCULAR | Status: DC
  Filled 2023-04-24: qty 9.85

## 2023-04-24 MED ORDER — NOREPINEPHRINE 4 MG/250ML-% IV SOLN
0.0000 ug/min | INTRAVENOUS | Status: DC
  Filled 2023-04-24: qty 250

## 2023-04-24 MED ORDER — CHLORHEXIDINE GLUCONATE 0.12 % MT SOLN
15.0000 mL | Freq: Once | OROMUCOSAL | Status: AC
  Administered 2023-04-25: 15 mL via OROMUCOSAL
  Filled 2023-04-24: qty 15

## 2023-04-24 MED ORDER — CHLORHEXIDINE GLUCONATE CLOTH 2 % EX PADS
6.0000 | MEDICATED_PAD | Freq: Once | CUTANEOUS | Status: AC
  Administered 2023-04-24: 6 via TOPICAL

## 2023-04-24 MED ORDER — INSULIN REGULAR(HUMAN) IN NACL 100-0.9 UT/100ML-% IV SOLN
INTRAVENOUS | Status: AC
  Administered 2023-04-25: .7 [IU]/h via INTRAVENOUS
  Filled 2023-04-24: qty 100

## 2023-04-24 MED ORDER — ALPRAZOLAM 0.25 MG PO TABS: 0.2500 mg | ORAL_TABLET | ORAL | Status: DC | PRN

## 2023-04-24 MED ORDER — METOPROLOL TARTRATE 12.5 MG HALF TABLET
12.5000 mg | ORAL_TABLET | Freq: Once | ORAL | Status: AC
  Administered 2023-04-25: 12.5 mg via ORAL
  Filled 2023-04-24: qty 1

## 2023-04-24 MED ORDER — TRANEXAMIC ACID (OHS) PUMP PRIME SOLUTION
2.0000 mg/kg | INTRAVENOUS | Status: DC
  Filled 2023-04-24: qty 1.31

## 2023-04-24 MED ORDER — EPINEPHRINE HCL 5 MG/250ML IV SOLN IN NS
0.0000 ug/min | INTRAVENOUS | Status: DC
  Filled 2023-04-24: qty 250

## 2023-04-24 MED ORDER — DIAZEPAM 5 MG PO TABS
5.0000 mg | ORAL_TABLET | Freq: Once | ORAL | Status: AC
  Administered 2023-04-25: 5 mg via ORAL
  Filled 2023-04-24: qty 1

## 2023-04-24 MED ORDER — CHLORHEXIDINE GLUCONATE CLOTH 2 % EX PADS
6.0000 | MEDICATED_PAD | Freq: Once | CUTANEOUS | Status: AC
  Administered 2023-04-25: 6 via TOPICAL

## 2023-04-24 MED ORDER — TRANEXAMIC ACID (OHS) BOLUS VIA INFUSION
15.0000 mg/kg | INTRAVENOUS | Status: AC
  Administered 2023-04-25: 981 mg via INTRAVENOUS
  Filled 2023-04-24: qty 981

## 2023-04-24 MED ORDER — CEFAZOLIN SODIUM-DEXTROSE 2-4 GM/100ML-% IV SOLN
2.0000 g | INTRAVENOUS | Status: DC
  Filled 2023-04-24: qty 100

## 2023-04-24 MED ORDER — HEPARIN 30,000 UNITS/1000 ML (OHS) CELLSAVER SOLUTION
Status: DC
  Filled 2023-04-24: qty 1000

## 2023-04-24 MED ORDER — PHENYLEPHRINE HCL-NACL 20-0.9 MG/250ML-% IV SOLN
30.0000 ug/min | INTRAVENOUS | Status: AC
  Administered 2023-04-25: 40 ug/min via INTRAVENOUS
  Filled 2023-04-24: qty 250

## 2023-04-24 MED ORDER — BISACODYL 5 MG PO TBEC
5.0000 mg | DELAYED_RELEASE_TABLET | Freq: Once | ORAL | Status: AC
  Administered 2023-04-24: 5 mg via ORAL
  Filled 2023-04-24: qty 1

## 2023-04-24 MED ORDER — POTASSIUM CHLORIDE 2 MEQ/ML IV SOLN
80.0000 meq | INTRAVENOUS | Status: DC
  Filled 2023-04-24: qty 40

## 2023-04-24 MED ORDER — TRANEXAMIC ACID 1000 MG/10ML IV SOLN
1.5000 mg/kg/h | INTRAVENOUS | Status: AC
  Administered 2023-04-25: 1.5 mg/kg/h via INTRAVENOUS
  Filled 2023-04-24: qty 25

## 2023-04-24 MED ORDER — DEXMEDETOMIDINE HCL IN NACL 400 MCG/100ML IV SOLN
0.1000 ug/kg/h | INTRAVENOUS | Status: AC
  Administered 2023-04-25: .4 ug/kg/h via INTRAVENOUS
  Filled 2023-04-24: qty 100

## 2023-04-24 MED ORDER — MILRINONE LACTATE IN DEXTROSE 20-5 MG/100ML-% IV SOLN
0.3000 ug/kg/min | INTRAVENOUS | Status: DC
  Filled 2023-04-24: qty 100

## 2023-04-24 MED ORDER — PLASMA-LYTE A IV SOLN
INTRAVENOUS | Status: DC
  Filled 2023-04-24: qty 2.5

## 2023-04-24 MED ORDER — NITROGLYCERIN IN D5W 200-5 MCG/ML-% IV SOLN
2.0000 ug/min | INTRAVENOUS | Status: AC
  Administered 2023-04-25: 5 ug/min via INTRAVENOUS
  Filled 2023-04-24: qty 250

## 2023-04-24 MED ORDER — VANCOMYCIN HCL 1250 MG/250ML IV SOLN
1250.0000 mg | INTRAVENOUS | Status: AC
  Administered 2023-04-25: 1250 mg via INTRAVENOUS
  Filled 2023-04-24: qty 250

## 2023-04-24 NOTE — H&P (View-Only) (Signed)
2 Days Post-Op Procedure(s) (LRB): RIGHT/LEFT HEART CATH AND CORONARY ANGIOGRAPHY (N/A) Subjective: Unable to have CABG today due to lack of OR personnel Dr. Weldner already has a case scheduled for tomorrow and asked me to see [name] for possible CABG No CP or SOB  Objective: Vital signs in last 24 hours: Temp:  [98.1 F (36.7 C)-98.8 F (37.1 C)] 98.7 F (37.1 C) (04/24 0754) Pulse Rate:  [71-87] 72 (04/24 0754) Cardiac Rhythm: Normal sinus rhythm (04/24 0739) Resp:  [16-20] 16 (04/24 0754) BP: (112-135)/(62-99) 135/99 (04/24 0754) SpO2:  [96 %-99 %] 99 % (04/24 0754)  Hemodynamic parameters for last 24 hours:    Intake/Output from previous day: 04/23 0701 - 04/24 0700 In: 1025.8 [P.O.:480; I.V.:308.8] Out: 800 [Urine:800] Intake/Output this shift: No intake/output data recorded.  General appearance: alert, cooperative, and no distress Neurologic: intact Heart: regular rate and rhythm Lungs: clear to auscultation bilaterally  Lab Results: Recent Labs    04/23/23 0602 04/24/23 0205  WBC 8.6 9.0  HGB 15.7 15.6  HCT 45.6 45.6  PLT 142* 146*   BMET:  Recent Labs    04/23/23 0602 04/24/23 0205  NA 136 138  K 4.2 4.1  CL 105 108  CO2 20* 23  GLUCOSE 104* 106*  BUN 17 28*  CREATININE 0.97 1.05  CALCIUM 9.2 9.1    PT/INR:  Recent Labs    04/24/23 0205  LABPROT 14.3  INR 1.1   ABG    Component Value Date/Time   PHART 7.369 04/22/2023 1629   HCO3 23.4 04/22/2023 1629   TCO2 25 04/22/2023 1629   ACIDBASEDEF 2.0 04/22/2023 1629   O2SAT 99 04/22/2023 1629   CBG (last 3)  No results for input(s): "GLUCAP" in the last 72 hours.  Assessment/Plan: S/P Procedure(s) (LRB): RIGHT/LEFT HEART CATH AND CORONARY ANGIOGRAPHY (N/A) Severe 3 vessel CAD CABG indicated for survival benefit and relief of symptoms We reviewed the indications, risks, benefits and alternatives. He understands the risks include but are not limited to death, MI, DVT, PE, bleeding,  possible need for transfusion, infection, respiratory or renal failure, cardiac arrhythmias, as well as other unforeseeable complications.  He accepts risks and agrees to proceed.  For CABg + L radial artery tomorrow AM  LOS: 5 days    Lillianna Sabel C Baelynn Schmuhl 04/24/2023   

## 2023-04-24 NOTE — Progress Notes (Addendum)
Rounding Note    Patient Name: Caleb Perkins Date of Encounter: 04/24/2023  [facility] Cardiologist: Dina Rich, MD   Subjective   Feels well, no complaints this morning  Inpatient Medications    Scheduled Meds:  amLODipine  10 mg Oral Daily   aspirin EC  81 mg Oral Daily   atorvastatin  40 mg Oral Daily   carvedilol  12.5 mg Oral BID WC   Chlorhexidine Gluconate Cloth  6 each Topical Daily   feeding supplement  237 mL Oral BID BM   isosorbide mononitrate  30 mg Oral Daily   multivitamin with minerals  1 tablet Oral Daily   sacubitril-valsartan  1 tablet Oral BID   spironolactone  25 mg Oral Daily   Continuous Infusions:  sodium chloride Stopped (04/23/23 0825)   sodium chloride     heparin 1,400 Units/hr (04/24/23 0458)   PRN Meds: sodium chloride, acetaminophen **OR** acetaminophen, hydrALAZINE, ondansetron **OR** ondansetron (ZOFRAN) IV   Vital Signs    Vitals:   04/23/23 1800 04/23/23 1900 04/23/23 1946 04/24/23 0436  BP:   122/81 121/78  Pulse: 79 79 81 71  Resp:   20   Temp:   98.8 F (37.1 C) 98.1 F (36.7 C)  TempSrc:   Oral Oral  SpO2: 97% 98% 98% 96%  Weight:      Height:        Intake/Output Summary (Last 24 hours) at 04/24/2023 0748 Last data filed at 04/24/2023 0458 Gross per 24 hour  Intake 1025.79 ml  Output 800 ml  Net 225.79 ml      04/22/2023   12:37 PM 04/22/2023    5:00 AM 04/21/2023    5:00 AM  Last 3 Weights  Weight (lbs) 144 lb 2.9 oz 145 lb 15.1 oz 145 lb 15.1 oz  Weight (kg) 65.4 kg 66.2 kg 66.2 kg      Telemetry    Sinus Rhythm - Personally Reviewed  ECG    No new tracing  Physical Exam   GEN: No acute distress.   Neck: No JVD Cardiac: RRR, no murmurs, rubs, or gallops.  Respiratory: Clear to auscultation bilaterally. GI: Soft, nontender, non-distended  MS: No edema; No deformity. Neuro:  Nonfocal  Psych: Normal affect   Labs    High Sensitivity Troponin:   Recent Labs  Lab  04/19/23 0741 04/19/23 0948  TROPONINIHS 78* 102*     Chemistry Recent Labs  Lab 04/20/23 0518 04/22/23 0841 04/22/23 1622 04/22/23 1629 04/23/23 0602 04/24/23 0205  NA 139 135   < > 137 136 138  K 3.5 3.7   < > 4.0 4.2 4.1  CL 104 103  --   --  105 108  CO2 25 22  --   --  20* 23  GLUCOSE 97 109*  --   --  104* 106*  BUN 16 19  --   --  17 28*  CREATININE 1.03 1.04  --   --  0.97 1.05  CALCIUM 9.2 9.1  --   --  9.2 9.1  MG 2.0  --   --   --  2.1  --   PROT 7.0  --   --   --  6.8 6.6  ALBUMIN 4.1  --   --   --  3.7 3.5  AST 20  --   --   --  15 14*  ALT 18  --   --   --  13 13  ALKPHOS 56  --   --   --  49 51  BILITOT 1.3*  --   --   --  1.1 0.9  GFRNONAA >60 >60  --   --  >60 >60  ANIONGAP 10 10  --   --  11 7   < > = values in this interval not displayed.    Lipids  Recent Labs  Lab 04/20/23 0518  CHOL 181  TRIG 116  HDL 35*  LDLCALC 123*  CHOLHDL 5.2    Hematology Recent Labs  Lab 04/22/23 0841 04/22/23 1622 04/22/23 1629 04/23/23 0602 04/24/23 0205  WBC 9.5  --   --  8.6 9.0  RBC 4.61  --   --  4.93 4.87  HGB 14.6   < > 15.0 15.7 15.6  HCT 42.9   < > 44.0 45.6 45.6  MCV 93.1  --   --  92.5 93.6  MCH 31.7  --   --  31.8 32.0  MCHC 34.0  --   --  34.4 34.2  RDW 12.4  --   --  12.4 12.4  PLT 152  --   --  142* 146*   < > = values in this interval not displayed.   Thyroid  Recent Labs  Lab 04/20/23 0518  TSH 0.963    BNP Recent Labs  Lab 04/19/23 0741  BNP 495.0*    DDimer No results for input(s): "DDIMER" in the last 168 hours.   Radiology    VAS US DOPPLER PRE CABG  Result Date: 04/23/2023 PREOPERATIVE VASCULAR EVALUATION Patient Name:  SMITH POTENZA  Date of Exam:   04/23/2023 Medical Rec #: [id number]        Accession #:    [id number] Date of Birth: [birth year]         Patient Gender: M Patient Age:   17 years Exam Location:  [facility] Procedure:      VAS US DOPPLER PRE CABG Referring Phys: Eugenio Hoes  --------------------------------------------------------------------------------  Indications:      Pre-CABG. Risk Factors:     Hypertension, past history of smoking, coronary artery                   disease. Comparison Study: No prior studies. Performing Technologist: Jean Rosenthal RDMS, RVT  Examination Guidelines: A complete evaluation includes B-mode imaging, spectral Doppler, color Doppler, and power Doppler as needed of all accessible portions of each vessel. Bilateral testing is considered an integral part of a complete examination. Limited examinations for reoccurring indications may be performed as noted.  Right Carotid Findings: +----------+--------+--------+--------+--------+--------+           PSV cm/sEDV cm/sStenosisDescribeComments +----------+--------+--------+--------+--------+--------+ CCA Prox  110     16                               +----------+--------+--------+--------+--------+--------+ CCA Distal102     17                               +----------+--------+--------+--------+--------+--------+ ICA Prox  40      12      1-39%   calcific         +----------+--------+--------+--------+--------+--------+ ICA Mid   63      23                               +----------+--------+--------+--------+--------+--------+  ICA Distal78      25                               +----------+--------+--------+--------+--------+--------+ ECA       201     12                               +----------+--------+--------+--------+--------+--------+ +----------+--------+-------+----------------+------------+           PSV cm/sEDV cmsDescribe        Arm Pressure +----------+--------+-------+----------------+------------+ [id number]             Multiphasic, WNL             +----------+--------+-------+----------------+------------+ +---------+--------+--+--------+-+---------+ VertebralPSV cm/s38EDV cm/s8Antegrade  +---------+--------+--+--------+-+---------+ Left Carotid Findings: +----------+--------+--------+--------+----------------------------+--------+           PSV cm/sEDV cm/sStenosisDescribe                    Comments +----------+--------+--------+--------+----------------------------+--------+ CCA Prox  68      12                                                   +----------+--------+--------+--------+----------------------------+--------+ CCA Distal75      16                                                   +----------+--------+--------+--------+----------------------------+--------+ ICA Prox  62      14      1-39%   hyperechoic and heterogenous         +----------+--------+--------+--------+----------------------------+--------+ ICA Mid   81      32                                                   +----------+--------+--------+--------+----------------------------+--------+ ICA Distal58      25                                                   +----------+--------+--------+--------+----------------------------+--------+ ECA       154     7                                                    +----------+--------+--------+--------+----------------------------+--------+ +----------+--------+--------+----------------+------------+ SubclavianPSV cm/sEDV cm/sDescribe        Arm Pressure +----------+--------+--------+----------------+------------+           145             Multiphasic, WNL             +----------+--------+--------+----------------+------------+ +---------+--------+--+--------+--+---------+ VertebralPSV cm/s58EDV cm/s25Antegrade +---------+--------+--+--------+--+---------+  ABI Findings: +--------+------------------+-----+---------+--------+ Right   Rt Pressure (mmHg)IndexWaveform Comment  +--------+------------------+-----+---------+--------+ Brachial  triphasic          +--------+------------------+-----+---------+--------+ PTA                            triphasic         +--------+------------------+-----+---------+--------+ DP                             triphasic         +--------+------------------+-----+---------+--------+ +--------+------------------+-----+---------+-------+ Left    Lt Pressure (mmHg)IndexWaveform Comment +--------+------------------+-----+---------+-------+ Brachial                       triphasic        +--------+------------------+-----+---------+-------+ PTA                            triphasic        +--------+------------------+-----+---------+-------+ DP                             triphasic        +--------+------------------+-----+---------+-------+  Right Doppler Findings: +--------+--------+-----+---------+--------+ Site    PressureIndexDoppler  Comments +--------+--------+-----+---------+--------+ Brachial             triphasic         +--------+--------+-----+---------+--------+ Radial               triphasic         +--------+--------+-----+---------+--------+ Ulnar                triphasic         +--------+--------+-----+---------+--------+  Left Doppler Findings: +--------+--------+-----+---------+--------+ Site    PressureIndexDoppler  Comments +--------+--------+-----+---------+--------+ Brachial             triphasic         +--------+--------+-----+---------+--------+ Radial               triphasic         +--------+--------+-----+---------+--------+ Ulnar                triphasic         +--------+--------+-----+---------+--------+   Summary: Right Carotid: Velocities in the right ICA are consistent with a 1-39% stenosis. Left Carotid: Velocities in the left ICA are consistent with a 1-39% stenosis. Vertebrals:  Bilateral vertebral arteries demonstrate antegrade flow. Subclavians: Normal flow hemodynamics were seen in bilateral subclavian               arteries. Right ABI: Triphasic PTA, multiphasic DPA. Left ABI: Triphasic PTA and DPA.  Right Upper Extremity: Doppler waveforms remain within normal limits with right radial compression. Doppler waveforms remain within normal limits with right ulnar compression. Left Upper Extremity: Doppler waveforms remain within normal limits with left radial compression. Doppler waveforms remain within normal limits with left ulnar compression.  Electronically signed by Coral Else MD on 04/23/2023 at 7:26:57 PM.    Final    CARDIAC CATHETERIZATION  Result Date: 04/22/2023 Conclusions: Severe three-vessel coronary artery disease, as detailed below, including sequential 95% ostial and 80-90% mid LAD stenoses, 60% OM1 lesion, 70-80% mid/distal LCx disease, and chronic total occlusion of proximal/mid RCA with left-to-right and right-to-right collaterals. Moderately reduced left ventricular systolic function (LVEF 35-45%). Normal left heart, right heart, and pulmonary artery pressures. Normal Fick cardiac output/index. Recommendations: Gentle post-catheterization hydration; aim for net even fluid balance. Escalate goal directed medical therapy for acute HFrEF;  I will add Entresto 24-26 mg BID beginning this evening. Cardiac surgery consultation for CABG. Aggressive secondary prevention of coronary artery disease. Start heparin infusion 2 hours after TR band removal in the setting of severe multivessel CAD and elevated troponin this admission. Yvonne Kendall, MD [facility]   Cardiac Studies   Cath: 04/22/2023   Conclusions: Severe three-vessel coronary artery disease, as detailed below, including sequential 95% ostial and 80-90% mid LAD stenoses, 60% OM1 lesion, 70-80% mid/distal LCx disease, and chronic total occlusion of proximal/mid RCA with left-to-right and right-to-right collaterals. Moderately reduced left ventricular systolic function (LVEF 35-45%). Normal left heart, right heart, and pulmonary artery  pressures. Normal Fick cardiac output/index.   Recommendations: Gentle post-catheterization hydration; aim for net even fluid balance. Escalate goal directed medical therapy for acute HFrEF; I will add Entresto 24-26 mg BID beginning this evening. Cardiac surgery consultation for CABG. Aggressive secondary prevention of coronary artery disease. Start heparin infusion 2 hours after TR band removal in the setting of severe multivessel CAD and elevated troponin this admission.   Yvonne Kendall, MD [facility]   Diagnostic Dominance: Right  Echo: 04/19/2023   IMPRESSIONS     1. Left ventricular ejection fraction, by estimation, is 35 to 40%. The  left ventricle has moderately decreased function. The left ventricle  demonstrates global hypokinesis. There is moderate left ventricular  hypertrophy. Left ventricular diastolic  parameters are consistent with Grade II diastolic dysfunction  (pseudonormalization). Elevated left atrial pressure.   2. Right ventricular systolic function is normal. The right ventricular  size is normal. There is severely elevated pulmonary artery systolic  pressure.   3. Left atrial size was mildly dilated.   4. Right atrial size was mildly dilated.   5. The mitral valve is abnormal. Mild mitral valve regurgitation. No  evidence of mitral stenosis.   6. The tricuspid valve is abnormal.   7. The aortic valve is tricuspid. Aortic valve regurgitation is mild. No  aortic stenosis is present.   8. Aortic dilatation noted. There is mild dilatation of the ascending  aorta, measuring 40 mm.   9. The inferior vena cava is dilated in size with <50% respiratory  variability, suggesting right atrial pressure of 15 mmHg.   FINDINGS   Left Ventricle: Left ventricular ejection fraction, by estimation, is 35  to 40%. The left ventricle has moderately decreased function. The left  ventricle demonstrates global hypokinesis. The left ventricular internal  cavity size  was normal in size.  There is moderate left ventricular hypertrophy. Left ventricular diastolic  parameters are consistent with Grade II diastolic dysfunction  (pseudonormalization). Elevated left atrial pressure.   Right Ventricle: The right ventricular size is normal. Right vetricular  wall thickness was not well visualized. Right ventricular systolic  function is normal. There is severely elevated pulmonary artery systolic  pressure. The tricuspid regurgitant  velocity is 3.72 m/s, and with an assumed right atrial pressure of 15  mmHg, the estimated right ventricular systolic pressure is 70.4 mmHg.   Left Atrium: Left atrial size was mildly dilated.   Right Atrium: Right atrial size was mildly dilated.   Pericardium: There is no evidence of pericardial effusion.   Mitral Valve: The mitral valve is abnormal. Mild mitral valve  regurgitation. No evidence of mitral valve stenosis.   Tricuspid Valve: The tricuspid valve is abnormal. Tricuspid valve  regurgitation is mild . No evidence of tricuspid stenosis.   Aortic Valve: The aortic valve is tricuspid. Aortic valve regurgitation is  mild.  No aortic stenosis is present. Aortic valve mean gradient measures  1.4 mmHg. Aortic valve peak gradient measures 3.1 mmHg. Aortic valve area,  by VTI measures 5.54 cm.   Pulmonic Valve: The pulmonic valve was not well visualized. Pulmonic valve  regurgitation is not visualized. No evidence of pulmonic stenosis.   Aorta: The aortic root is normal in size and structure and aortic  dilatation noted. There is mild dilatation of the ascending aorta,  measuring 40 mm.   Venous: The inferior vena cava is dilated in size with less than 50%  respiratory variability, suggesting right atrial pressure of 15 mmHg.   IAS/Shunts: No atrial level shunt detected by color flow Doppler.    Patient Profile     47 y.o. male with hx of HTN ("for most of my life"/on and off medication), tobacco abuse, THC use,  prior homelessness (now has a residence) admitted with hypertensive emergency and acute CHF, found to have systolic dysfunction.   Assessment & Plan    CAD -- High-sensitivity troponin >>102.  Underwent cardiac catheterization noted above on 4/22 with severe three-vessel CAD, normal left, right heart and pulmonary artery pressures.  Recommendations for TCTS consult for CABG, seen by Dr. Leafy Ro plans for CABG, scheduled for tomorrow -- Continue IV heparin, aspirin, carvedilol 12.5 mg twice daily, Imdur 30 mg daily, Entresto 24-26 mg twice daily, spironolactone 25 mg daily   HFrEF ICM -- LVEF of 35 to 40%, global hypokinesis, grade 2 diastolic dysfunction, normal RV size and function, mild biatrial enlargement, mild MR -- Normal pressures on cath -- GDMT: Continue Coreg 12.5 mg twice daily, Entresto 24-26 mg twice daily (will hold preop with the concern for increased of vasoplegia intra-op), spironolactone 25 mg daily. Plan for SGLT2i post op   Hypertensive emergency -- Initial blood pressure 228/156 in the setting of noncompliance.  Significant improvement -- Continue Coreg 12.5 mg twice daily, Entresto 24-26 mg twice daily (will hold preop with the concern for increased of vasoplegia intra-op), spironolactone 25 mg daily, amlodipine 10 mg daily   HLD -- LDL 123, HDL 35 -- Increased atorvastatin to 80 mg daily  Tobacco use -- cessation advised   For questions or updates, please contact [facility] Please consult [email or web address] for contact info under        Signed, Laverda Page, NP  04/24/2023, 7:48 AM    I have personally seen and examined this patient. I agree with the assessment and plan as outlined above.  Doing well today. CABG pending tomorrow. No chest pain today. Continue current medical therapy.   Verne Carrow, MD, [facility] 04/24/2023 10:08 AM

## 2023-04-24 NOTE — Anesthesia Preprocedure Evaluation (Addendum)
Anesthesia Evaluation  Patient identified by MRN, date of birth, ID band Patient awake    Reviewed: Allergy & Precautions, NPO status , Patient's Chart, lab work & pertinent test results  Airway Mallampati: II  TM Distance: >3 FB Neck ROM: Full    Dental  (+) Dental Advisory Given   Pulmonary COPD, Current Smoker   Pulmonary exam normal breath sounds clear to auscultation       Cardiovascular hypertension, Pt. on medications and Pt. on home beta blockers pulmonary hypertension+ CAD and +CHF  Normal cardiovascular exam Rhythm:Regular Rate:Normal  Echo 04/2023  1. Left ventricular ejection fraction, by estimation, is 35 to 40%. The left ventricle has moderately decreased function. The left ventricle demonstrates global hypokinesis. There is moderate left ventricular hypertrophy. Left ventricular diastolic parameters are consistent with Grade II diastolic dysfunction (pseudonormalization). Elevated left atrial pressure.   2. Right ventricular systolic function is normal. The right ventricular size is normal. There is severely elevated pulmonary artery systolic pressure.   3. Left atrial size was mildly dilated.   4. Right atrial size was mildly dilated.   5. The mitral valve is abnormal. Mild mitral valve regurgitation. No evidence of mitral stenosis.   6. The tricuspid valve is abnormal.   7. The aortic valve is tricuspid. Aortic valve regurgitation is mild. No aortic stenosis is present.   8. Aortic dilatation noted. There is mild dilatation of the ascending aorta, measuring 40 mm.   9. The inferior vena cava is dilated in size with <50% respiratory variability, suggesting right atrial pressure of 15 mmHg.    LHC 04/2023 1. Severe three-vessel coronary artery disease, as detailed below, including sequential 95% ostial and 80-90% mid LAD stenoses, 60% OM1 lesion, 70-80% mid/distal LCx disease, and chronic total occlusion of proximal/mid  RCA with left-to-right and right-to-right collaterals. 2. Moderately reduced left ventricular systolic function (LVEF 35-45%). 3. Normal left heart, right heart, and pulmonary artery pressures. 4. Normal Fick cardiac output/index.     Neuro/Psych negative neurological ROS     GI/Hepatic negative GI ROS, Neg liver ROS,,,  Endo/Other  negative endocrine ROS    Renal/GU negative Renal ROS     Musculoskeletal negative musculoskeletal ROS (+)    Abdominal   Peds  Hematology negative hematology ROS (+)   Anesthesia Other Findings   Reproductive/Obstetrics                             Anesthesia Physical Anesthesia Plan  ASA: 4  Anesthesia Plan: General   Post-op Pain Management:    Induction: Intravenous  PONV Risk Score and Plan: 2 and Midazolam and Treatment may vary due to age or medical condition  Airway Management Planned: Oral ETT  Additional Equipment: Arterial line, CVP, PA Cath, TEE and Ultrasound Guidance Line Placement  Intra-op Plan:   Post-operative Plan: Post-operative intubation/ventilation  Informed Consent: I have reviewed the patients History and Physical, chart, labs and discussed the procedure including the risks, benefits and alternatives for the proposed anesthesia with the patient or authorized representative who has indicated his/her understanding and acceptance.     Dental advisory given  Plan Discussed with: CRNA  Anesthesia Plan Comments:        Anesthesia Quick Evaluation

## 2023-04-24 NOTE — Progress Notes (Addendum)
ANTICOAGULATION CONSULT NOTE   Pharmacy Consult for Heparin Indication:  multivessel CAD  No Known Allergies  Patient Measurements: Height:  (177.8 cm) Weight: 65.4 kg (144 lb 2.9 oz) IBW/kg (Calculated) : 73 Heparin Dosing Weight: 65 kg  Vital Signs: Temp: 98.1 F (36.7 C) (04/24 0436) Temp Source: Oral (04/24 0436) BP: 121/78 (04/24 0436) Pulse Rate: 71 (04/24 0436)  Labs: Recent Labs    04/22/23 0841 04/22/23 1622 04/22/23 1629 04/22/23 1629 04/23/23 0602 04/23/23 1427 04/23/23 2027 04/24/23 0205  HGB 14.6   < > 15.0  --  15.7  --   --  15.6  HCT 42.9   < > 44.0  --  45.6  --   --  45.6  PLT 152  --   --   --  142*  --   --  146*  APTT  --   --   --   --   --   --   --  106*  LABPROT  --   --   --   --   --   --   --  14.3  INR  --   --   --   --   --   --   --  1.1  HEPARINUNFRC  --   --   --    < > 0.21* 0.22* 0.41 0.49  CREATININE 1.04  --   --   --  0.97  --   --  1.05   < > = values in this interval not displayed.     Estimated Creatinine Clearance: 83 mL/min (by C-G formula based on SCr of 1.05 mg/dL).   Medical History: Past Medical History:  Diagnosis Date   Blood transfusion without reported diagnosis    Hypertension     Medications:  Medications Prior to Admission  Medication Sig Dispense Refill Last Dose   albuterol (VENTOLIN HFA) 108 (90 Base) MCG/ACT inhaler Inhale 2 puffs into the lungs every 6 (six) hours as needed for wheezing or shortness of breath. (Patient not taking: Reported on 04/19/2023) 18 g 2 Not Taking   amLODipine (NORVASC) 10 MG tablet Take 1 tablet (10 mg total) by mouth daily. (Patient not taking: Reported on 04/19/2023) 30 tablet 1 Not Taking   doxycycline (VIBRA-TABS) 100 MG tablet Take 1 tablet (100 mg total) by mouth 2 (two) times daily. (Patient not taking: Reported on 04/19/2023) 8 tablet 0 Completed Course   Fluticasone-Salmeterol (ADVAIR DISKUS) 250-50 MCG/DOSE AEPB Inhale 1 puff into the lungs 2 (two) times daily.  (Patient not taking: Reported on 04/19/2023) 60 each 2 Not Taking   predniSONE (DELTASONE) 20 MG tablet 3 tablets by mouth daily x1 day; then 2 tablets by mouth daily x2 days; then 1 tablet by mouth daily x3 days; day and half dollar by mouth daily x3 days stop prednisone. (Patient not taking: Reported on 04/19/2023) 12 tablet 0 Completed Course   spironolactone-hydrochlorothiazide (ALDACTAZIDE) 25-25 MG tablet Take 1 tablet by mouth daily. (Patient not taking: Reported on 04/19/2023) 30 tablet 1 Not Taking    Assessment: 47 y.o. M presents with elevated trop and acute HFrEF. S/p cath which showed multivessel CAD. Cardiac surgery consulted for CABG. To start heparin 2 hours post TR band removal. TR band removed at 2000. CBC stable on admission.  Heparin level therapeutic, CBC stable.  Goal of Therapy:  Heparin level 0.3-0.7 units/ml Monitor platelets by anticoagulation protocol: Yes   Plan:  Continue heparin 1400 units/h - stop  4/25 0400 pre-CABG Daily heparin level and CBC  Fredonia Highland, PharmD, [place], [facility] Clinical Pharmacist [phone] Please check AMION for all [facility] Pharmacy numbers 04/24/2023

## 2023-04-24 NOTE — Progress Notes (Signed)
CARDIAC REHAB PHASE I   Pt in bed having lunch. Reviewed pre-op education provided yesterday. All questions and concerns addressed. Plan for surgery tomorrow. Will continue to follow.   [id number] Woodroe Chen, RN BSN 04/24/2023 2:29 PM

## 2023-04-24 NOTE — Progress Notes (Signed)
2 Days Post-Op Procedure(s) (LRB): RIGHT/LEFT HEART CATH AND CORONARY ANGIOGRAPHY (N/A) Subjective: Unable to have CABG today due to lack of OR personnel Dr. Leafy Ro already has a case scheduled for tomorrow and asked me to see Caleb Perkins for possible CABG No CP or SOB  Objective: Vital signs in last 24 hours: Temp:  [98.1 F (36.7 C)-98.8 F (37.1 C)] 98.7 F (37.1 C) (04/24 0754) Pulse Rate:  [71-87] 72 (04/24 0754) Cardiac Rhythm: Normal sinus rhythm (04/24 0739) Resp:  [16-20] 16 (04/24 0754) BP: (112-135)/(62-99) 135/99 (04/24 0754) SpO2:  [96 %-99 %] 99 % (04/24 0754)  Hemodynamic parameters for last 24 hours:    Intake/Output from previous day: 04/23 0701 - 04/24 0700 In: 1025.8 [P.O.:480; I.V.:308.8] Out: 800 [Urine:800] Intake/Output this shift: No intake/output data recorded.  General appearance: alert, cooperative, and no distress Neurologic: intact Heart: regular rate and rhythm Lungs: clear to auscultation bilaterally  Lab Results: Recent Labs    04/23/23 0602 04/24/23 0205  WBC 8.6 9.0  HGB 15.7 15.6  HCT 45.6 45.6  PLT 142* 146*   BMET:  Recent Labs    04/23/23 0602 04/24/23 0205  NA 136 138  K 4.2 4.1  CL 105 108  CO2 20* 23  GLUCOSE 104* 106*  BUN 17 28*  CREATININE 0.97 1.05  CALCIUM 9.2 9.1    PT/INR:  Recent Labs    04/24/23 0205  LABPROT 14.3  INR 1.1   ABG    Component Value Date/Time   PHART 7.369 04/22/2023 1629   HCO3 23.4 04/22/2023 1629   TCO2 25 04/22/2023 1629   ACIDBASEDEF 2.0 04/22/2023 1629   O2SAT 99 04/22/2023 1629   CBG (last 3)  No results for input(s): "GLUCAP" in the last 72 hours.  Assessment/Plan: S/P Procedure(s) (LRB): RIGHT/LEFT HEART CATH AND CORONARY ANGIOGRAPHY (N/A) Severe 3 vessel CAD CABG indicated for survival benefit and relief of symptoms We reviewed the indications, risks, benefits and alternatives. He understands the risks include but are not limited to death, MI, DVT, PE, bleeding,  possible need for transfusion, infection, respiratory or renal failure, cardiac arrhythmias, as well as other unforeseeable complications.  He accepts risks and agrees to proceed.  For CABg + L radial artery tomorrow AM  LOS: 5 days    Loreli Slot 04/24/2023

## 2023-04-24 NOTE — Progress Notes (Signed)
PROGRESS NOTE    Caleb Perkins  AVW:[id number] DOB: [birth year] DOA: 04/19/2023  PCP: Patient, No Pcp Per   Brief Narrative:  This 47 y.o. male, with history of hypertension, noncompliant with home medications who has not taken his medications for the last 1-1/2 years, came to hospital with worsening shortness of breath and chest pain. In the ED he was found to be in hypertensive emergency with elevated troponin, BNP. Patient was admitted with acute diastolic CHF exacerbation in the setting of hypertensive emergency . He was transferred to [facility] for cardiac catheterization.  Left heart cath shows multivessel disease.  Cardiothoracic surgery consulted,  Patient is scheduled for CABG tomorrow.  Assessment & Plan:   Principal Problem:   Hypertensive emergency Active Problems:   Cardiomyopathy due to hypertension, with heart failure   Pulmonary HTN   Coronary artery calcification   Malnutrition of moderate degree  Acute systolic and diastolic heart failure/ Ischemic cardiomyopathy : Echocardiogram this admission showed LVEF 35 to 40% global hypokinesis, grade 2 diastolic dysfunction, normal RV size and function, mild biatrial enlargement,.  Continue with coreg 12.5 mg BID, Entresto 24-26 mg BID, Spironolactone 25 mg daily.    Elevated troponin in the setting of CAD: Patient  underwent cardiac catheterization on 4/22 showing severe three vessel CAD,. He remains on IV heparin, Aspirin, coreg, imdur and entresto and spironolactone.  Cardiothoracic surgery consulted for further recommendations.  Pulmonary function tests done. Patient is scheduled for CABG tomorrow.   Hypertensive emergency Secondary to non compliance to medications.  BP parameters have improved.  Continue Coreg, Entresto and spironolactone.   Hyperlipidemia:  Continue Lipitor 80 mg daily     Mild thrombocytopenia.  Stable,.   Estimated body mass index is 20.69 kg/m as calculated from the following:   Height as of  this encounter: 5\' 10"  (1.778 m).   Weight as of this encounter: 65.4 kg.     DVT prophylaxis: Heparin IV Code Status: Full code. Family Communication:No family at bed side. Disposition Plan:    Status is: Inpatient Remains inpatient appropriate because: Patient admitted for ischemic cardiomyopathy, left heart cath shows multivessel disease patient is a scheduled for CABG tomorrow    Consultants:  Cardiology Cardiothoracic surgery  Procedures: Echo, LHC  Antimicrobials:  Anti-infectives (From admission, onward)    Start     Dose/Rate Route Frequency Ordered Stop   04/25/23 0400  vancomycin (VANCOREADY) IVPB 1250 mg/250 mL        1,250 mg 166.7 mL/hr over 90 Minutes Intravenous To Surgery 04/24/23 1159 04/26/23 0400   04/25/23 0400  ceFAZolin (ANCEF) IVPB 2g/100 mL premix        2 g 200 mL/hr over 30 Minutes Intravenous To Surgery 04/24/23 1159 04/26/23 0400   04/25/23 0400  ceFAZolin (ANCEF) IVPB 2g/100 mL premix        2 g 200 mL/hr over 30 Minutes Intravenous To Surgery 04/24/23 1159 04/26/23 0400       Subjective: Patient was seen and examined at bedside.  Overnight events noted. Patient reports doing better.  Denies any chest pain.  Scheduled to have CABG tomorrow.  Objective: Vitals:   04/23/23 1946 04/24/23 0436 04/24/23 0754 04/24/23 0859  BP: 122/81 121/78 (!) 135/99 126/62  Pulse: 81 71 72 81  Resp: 20  16   Temp: 98.8 F (37.1 C) 98.1 F (36.7 C) 98.7 F (37.1 C)   TempSrc: Oral Oral Oral   SpO2: 98% 96% 99%   Weight:  Height:        Intake/Output Summary (Last 24 hours) at 04/24/2023 1513 Last data filed at 04/24/2023 0458 Gross per 24 hour  Intake 314.07 ml  Output 800 ml  Net -485.93 ml   Filed Weights   04/21/23 0500 04/22/23 0500 04/22/23 1237  Weight: 66.2 kg 66.2 kg 65.4 kg    Examination:  General exam: Appears calm and comfortable , not in any distress. Respiratory system: Clear to auscultation. Respiratory effort normal.  RR  12 Cardiovascular system: S1 & S2 heard, RRR. No JVD, murmurs, rubs, gallops or clicks. No pedal edema. Gastrointestinal system: Abdomen is soft, non tender, non distended, BS+ Central nervous system: Alert and oriented x 3. No focal neurological deficits. Extremities: Symmetric 5 x 5 power. Skin: No rashes, lesions or ulcers Psychiatry: Judgement and insight appear normal. Mood & affect appropriate.     Data Reviewed: I have personally reviewed following labs and imaging studies  CBC: Recent Labs  Lab 04/19/23 0741 04/20/23 0518 04/22/23 0841 04/22/23 1622 04/22/23 1629 04/23/23 0602 04/24/23 0205  WBC 8.8 7.4 9.5  --   --  8.6 9.0  NEUTROABS 6.5  --   --   --   --   --   --   HGB 15.4 15.6 14.6 15.0 15.0 15.7 15.6  HCT 47.9 46.6 42.9 44.0 44.0 45.6 45.6  MCV 98.2 94.5 93.1  --   --  92.5 93.6  PLT 158 154 152  --   --  142* 146*   Basic Metabolic Panel: Recent Labs  Lab 04/19/23 0741 04/20/23 0518 04/22/23 0841 04/22/23 1622 04/22/23 1629 04/23/23 0602 04/24/23 0205  NA 140 139 135 139 137 136 138  K 3.9 3.5 3.7 4.0 4.0 4.2 4.1  CL 107 104 103  --   --  105 108  CO2 --   --  20* 23  GLUCOSE 111* 97 109*  --   --  104* 106*  BUN --   --  17 28*  CREATININE 1.08 1.03 1.04  --   --  0.97 1.05  CALCIUM 9.1 9.2 9.1  --   --  9.2 9.1  MG  --  2.0  --   --   --  2.1  --    GFR: Estimated Creatinine Clearance: 83 mL/min (by C-G formula based on SCr of 1.05 mg/dL). Liver Function Tests: Recent Labs  Lab 04/19/23 0741 04/20/23 0518 04/23/23 0602 04/24/23 0205  AST 14*  ALT ALKPHOS 58 56 49 51  BILITOT 0.5 1.3* 1.1 0.9  PROT 7.5 7.0 6.8 6.6  ALBUMIN 4.4 4.1 3.7 3.5   Recent Labs  Lab 04/19/23 0741  LIPASE 35   No results for input(s): "AMMONIA" in the last 168 hours. Coagulation Profile: Recent Labs  Lab 04/24/23 0205  INR 1.1   Cardiac Enzymes: No results for input(s): "CKTOTAL", "CKMB", "CKMBINDEX",  "TROPONINI" in the last 168 hours. BNP (last 3 results) No results for input(s): "PROBNP" in the last 8760 hours. HbA1C: No results for input(s): "HGBA1C" in the last 72 hours. CBG: No results for input(s): "GLUCAP" in the last 168 hours. Lipid Profile: No results for input(s): "CHOL", "HDL", "LDLCALC", "TRIG", "CHOLHDL", "LDLDIRECT" in the last 72 hours. Thyroid Function Tests: No results for input(s): "TSH", "T4TOTAL", "FREET4", "T3FREE", "THYROIDAB" in the last 72 hours. Anemia Panel: No results for input(s): "VITAMINB12", "FOLATE", "FERRITIN", "TIBC", "IRON", "RETICCTPCT"  in the last 72 hours. Sepsis Labs: No results for input(s): "PROCALCITON", "LATICACIDVEN" in the last 168 hours.  Recent Results (from the past 240 hour(s))  MRSA Next Gen by PCR, Nasal     Status: None   Collection Time: 04/19/23  4:19 PM   Specimen: Nasal Mucosa; Nasal Swab  Result Value Ref Range Status   MRSA by PCR Next Gen NOT DETECTED NOT DETECTED Final    Comment: (NOTE) The GeneXpert MRSA Assay (FDA approved for NASAL specimens only), is one component of a comprehensive MRSA colonization surveillance program. It is not intended to diagnose MRSA infection nor to guide or monitor treatment for MRSA infections. Test performance is not FDA approved in patients less than 12 years old. Performed at [facility], [address] [place], [place] [address]   SARS CORONAVIRUS 2 (TAT 6-24 HRS) Anterior Nasal Swab     Status: None   Collection Time: 04/23/23  9:12 PM   Specimen: Anterior Nasal Swab  Result Value Ref Range Status   SARS Coronavirus 2 NEGATIVE NEGATIVE Final    Comment: (NOTE) SARS-CoV-2 target nucleic acids are NOT DETECTED.  The SARS-CoV-2 RNA is generally detectable in upper and lower respiratory specimens during the acute phase of infection. Negative results do not preclude SARS-CoV-2 infection, do not rule out co-infections with other pathogens, and should not be used as the sole basis  for treatment or other patient management decisions. Negative results must be combined with clinical observations, patient history, and epidemiological information. The expected result is Negative.  Fact Sheet for Patients: HairSlick.no  Fact Sheet for Healthcare Providers: quierodirigir.com  This test is not yet approved or cleared by the [place] FDA and  has been authorized for detection and/or diagnosis of SARS-CoV-2 by FDA under an Emergency Use Authorization (EUA). This EUA will remain  in effect (meaning this test can be used) for the duration of the COVID-19 declaration under Se ction 564(b)(1) of the Act, 21 U.S.C. section 360bbb-3(b)(1), unless the authorization is terminated or revoked sooner.  Performed at [facility], [address], [place], [place] [address]   Surgical pcr screen     Status: None   Collection Time: 04/23/23  9:12 PM   Specimen: Anterior Nasal Swab  Result Value Ref Range Status   MRSA, PCR NEGATIVE NEGATIVE Final   Staphylococcus aureus NEGATIVE NEGATIVE Final    Comment: (NOTE) The Xpert SA Assay (FDA approved for NASAL specimens in patients 75 years of age and older), is one component of a comprehensive surveillance program. It is not intended to diagnose infection nor to guide or monitor treatment. Performed at [facility], [address], [place], [place] [address]          Radiology Studies: VAS US DOPPLER PRE CABG  Result Date: 04/23/2023 PREOPERATIVE VASCULAR EVALUATION Patient Name:  Caleb Perkins  Date of Exam:   04/23/2023 Medical Rec #: [id number]        Accession #:    [id number] Date of Birth: Jun 05, 1976         Patient Gender: M Patient Age:   28 years Exam Location:  [facility] Procedure:      VAS US DOPPLER PRE CABG Referring Phys: Eugenio Hoes --------------------------------------------------------------------------------  Indications:       Pre-CABG. Risk Factors:     Hypertension, past history of smoking, coronary artery  disease. Comparison Study: No prior studies. Performing Technologist: Jean Rosenthal RDMS, RVT  Examination Guidelines: A complete evaluation includes B-mode imaging, spectral Doppler, color Doppler, and power Doppler as needed of all accessible portions of each vessel. Bilateral testing is considered an integral part of a complete examination. Limited examinations for reoccurring indications may be performed as noted.  Right Carotid Findings: +----------+--------+--------+--------+--------+--------+           PSV cm/sEDV cm/sStenosisDescribeComments +----------+--------+--------+--------+--------+--------+ CCA Prox  110     16                               +----------+--------+--------+--------+--------+--------+ CCA Distal102     17                               +----------+--------+--------+--------+--------+--------+ ICA Prox  40      12      1-39%   calcific         +----------+--------+--------+--------+--------+--------+ ICA Mid   63      23                               +----------+--------+--------+--------+--------+--------+ ICA Distal78      25                               +----------+--------+--------+--------+--------+--------+ ECA       201     12                               +----------+--------+--------+--------+--------+--------+ +----------+--------+-------+----------------+------------+           PSV cm/sEDV cmsDescribe        Arm Pressure +----------+--------+-------+----------------+------------+ [id number]             Multiphasic, WNL             +----------+--------+-------+----------------+------------+ +---------+--------+--+--------+-+---------+ VertebralPSV cm/s38EDV cm/s8Antegrade +---------+--------+--+--------+-+---------+ Left Carotid Findings:  +----------+--------+--------+--------+----------------------------+--------+           PSV cm/sEDV cm/sStenosisDescribe                    Comments +----------+--------+--------+--------+----------------------------+--------+ CCA Prox  68      12                                                   +----------+--------+--------+--------+----------------------------+--------+ CCA Distal75      16                                                   +----------+--------+--------+--------+----------------------------+--------+ ICA Prox  62      14      1-39%   hyperechoic and heterogenous         +----------+--------+--------+--------+----------------------------+--------+ ICA Mid   81      32                                                   +----------+--------+--------+--------+----------------------------+--------+  ICA Distal58      25                                                   +----------+--------+--------+--------+----------------------------+--------+ ECA       154     7                                                    +----------+--------+--------+--------+----------------------------+--------+ +----------+--------+--------+----------------+------------+ SubclavianPSV cm/sEDV cm/sDescribe        Arm Pressure +----------+--------+--------+----------------+------------+           145             Multiphasic, WNL             +----------+--------+--------+----------------+------------+ +---------+--------+--+--------+--+---------+ VertebralPSV cm/s58EDV cm/s25Antegrade +---------+--------+--+--------+--+---------+  ABI Findings: +--------+------------------+-----+---------+--------+ Right   Rt Pressure (mmHg)IndexWaveform Comment  +--------+------------------+-----+---------+--------+ Brachial                       triphasic         +--------+------------------+-----+---------+--------+ PTA                            triphasic          +--------+------------------+-----+---------+--------+ DP                             triphasic         +--------+------------------+-----+---------+--------+ +--------+------------------+-----+---------+-------+ Left    Lt Pressure (mmHg)IndexWaveform Comment +--------+------------------+-----+---------+-------+ Brachial                       triphasic        +--------+------------------+-----+---------+-------+ PTA                            triphasic        +--------+------------------+-----+---------+-------+ DP                             triphasic        +--------+------------------+-----+---------+-------+  Right Doppler Findings: +--------+--------+-----+---------+--------+ Site    PressureIndexDoppler  Comments +--------+--------+-----+---------+--------+ Brachial             triphasic         +--------+--------+-----+---------+--------+ Radial               triphasic         +--------+--------+-----+---------+--------+ Ulnar                triphasic         +--------+--------+-----+---------+--------+  Left Doppler Findings: +--------+--------+-----+---------+--------+ Site    PressureIndexDoppler  Comments +--------+--------+-----+---------+--------+ Brachial             triphasic         +--------+--------+-----+---------+--------+ Radial               triphasic         +--------+--------+-----+---------+--------+ Ulnar                triphasic         +--------+--------+-----+---------+--------+   Summary: Right  Carotid: Velocities in the right ICA are consistent with a 1-39% stenosis. Left Carotid: Velocities in the left ICA are consistent with a 1-39% stenosis. Vertebrals:  Bilateral vertebral arteries demonstrate antegrade flow. Subclavians: Normal flow hemodynamics were seen in bilateral subclavian              arteries. Right ABI: Triphasic PTA, multiphasic DPA. Left ABI: Triphasic PTA and DPA.  Right Upper  Extremity: Doppler waveforms remain within normal limits with right radial compression. Doppler waveforms remain within normal limits with right ulnar compression. Left Upper Extremity: Doppler waveforms remain within normal limits with left radial compression. Doppler waveforms remain within normal limits with left ulnar compression.  Electronically signed by Coral Else MD on 04/23/2023 at 7:26:57 PM.    Final    CARDIAC CATHETERIZATION  Result Date: 04/22/2023 Conclusions: Severe three-vessel coronary artery disease, as detailed below, including sequential 95% ostial and 80-90% mid LAD stenoses, 60% OM1 lesion, 70-80% mid/distal LCx disease, and chronic total occlusion of proximal/mid RCA with left-to-right and right-to-right collaterals. Moderately reduced left ventricular systolic function (LVEF 35-45%). Normal left heart, right heart, and pulmonary artery pressures. Normal Fick cardiac output/index. Recommendations: Gentle post-catheterization hydration; aim for net even fluid balance. Escalate goal directed medical therapy for acute HFrEF; I will add Entresto 24-26 mg BID beginning this evening. Cardiac surgery consultation for CABG. Aggressive secondary prevention of coronary artery disease. Start heparin infusion 2 hours after TR band removal in the setting of severe multivessel CAD and elevated troponin this admission. Yvonne Kendall, MD [facility]   Scheduled Meds:  amLODipine  10 mg Oral Daily   aspirin EC  81 mg Oral Daily   atorvastatin  40 mg Oral Daily   carvedilol  12.5 mg Oral BID WC   [START ON 04/25/2023] epinephrine  0-10 mcg/min Intravenous To OR   feeding supplement  237 mL Oral BID BM   [START ON 04/25/2023] heparin sodium (porcine) 2,500 Units, papaverine 30 mg in electrolyte-A (PLASMALYTE-A PH 7.4) 500 mL irrigation   Irrigation To OR   [START ON 04/25/2023] insulin   Intravenous To OR   isosorbide mononitrate  30 mg Oral Daily   [START ON 04/25/2023] magnesium sulfate  40  mEq Other To OR   [START ON 04/25/2023] metoprolol tartrate  12.5 mg Oral Once   multivitamin with minerals  1 tablet Oral Daily   [START ON 04/25/2023] phenylephrine  30-200 mcg/min Intravenous To OR   [START ON 04/25/2023] potassium chloride  80 mEq Other To OR   spironolactone  25 mg Oral Daily   [START ON 04/25/2023] tranexamic acid  15 mg/kg Intravenous To OR   [START ON 04/25/2023] tranexamic acid  2 mg/kg Intracatheter To OR   Continuous Infusions:  sodium chloride Stopped (04/23/23 0825)   sodium chloride     [START ON 04/25/2023]  ceFAZolin (ANCEF) IV     [START ON 04/25/2023]  ceFAZolin (ANCEF) IV     [START ON 04/25/2023] dexmedetomidine     [START ON 04/25/2023] heparin 30,000 units/NS 1000 mL solution for CELLSAVER     heparin 1,400 Units/hr (04/24/23 1404)   [START ON 04/25/2023] milrinone     [START ON 04/25/2023] nitroGLYCERIN     [START ON 04/25/2023] norepinephrine     [START ON 04/25/2023] tranexamic acid (CYKLOKAPRON) 2,500 mg in sodium chloride 0.9 % 250 mL (10 mg/mL) infusion     [START ON 04/25/2023] vancomycin       LOS: 5 days    Time spent: 50  mins    Willeen Niece, MD [facility]   If 7PM-7AM, please contact night-coverage

## 2023-04-24 NOTE — TOC Initial Note (Signed)
Transition of Care [facility]) - Initial/Assessment Note    Patient Details  Name: Caleb Perkins MRN: [id number] Date of Birth: 1976-03-24  Transition [facility]) CM/SW Contact:    Gala Lewandowsky, RN Phone Number: 04/24/2023, 2:00 PM  Clinical Narrative: Patient presented for chest pain. Plan for CABG 04-25-23. PTA patient was independent from home with mother in [place]. Patient has Medicaid; however does not have a physician. Patient wants to explore his options for PCP (aware to call the number on the card to see who is in network). Case Manager will continue to follow for additional needs as the patient progresses.                  Expected Discharge Plan: Home w Home Health Services Barriers to Discharge: Continued Medical Work up   Patient Goals and CMS Choice Patient states their goals for this hospitalization and ongoing recovery are:: to return home once stable.   Expected Discharge Plan and Services In-house Referral: NA Discharge Planning Services: CM Consult  Prior Living Arrangements/Services   Lives with:: Parents Patient language and need for interpreter reviewed:: Yes Do you feel safe going back to the place where you live?: Yes      Need for Family Participation in Patient Care: Yes (Comment) Care giver support system in place?: Yes (comment)   Criminal Activity/Legal Involvement Pertinent to Current Situation/Hospitalization: No - Comment as needed  Activities of Daily Living Home Assistive Devices/Equipment: None ADL Screening (condition at time of admission) Patient's cognitive ability adequate to safely complete daily activities?: Yes Is the patient deaf or have difficulty hearing?: No Does the patient have difficulty seeing, even when wearing glasses/contacts?: No Does the patient have difficulty concentrating, remembering, or making decisions?: No Patient able to express need for assistance with ADLs?: Yes Does the patient have difficulty  dressing or bathing?: No Independently performs ADLs?: Yes (appropriate for developmental age) Does the patient have difficulty walking or climbing stairs?: No Weakness of Legs: None Weakness of Arms/Hands: None  Permission Sought/Granted Permission sought to share information with : Family Supports, Case Manager    Emotional Assessment Appearance:: Appears stated age Attitude/Demeanor/Rapport: Engaged Affect (typically observed): Appropriate Orientation: : Oriented to Self, Oriented to Place, Oriented to  Time, Oriented to Situation Alcohol / Substance Use: Not Applicable Psych Involvement: No (comment)  Admission diagnosis:  Hypertensive emergency [I16.1] Patient Active Problem List   Diagnosis Date Noted   Malnutrition of moderate degree 04/23/2023   Cardiomyopathy due to hypertension, with heart failure 04/22/2023   Pulmonary HTN 04/22/2023   Coronary artery calcification 04/22/2023   Hypertensive emergency 04/19/2023   Acute exacerbation of chronic obstructive pulmonary disease (COPD) 11/10/2019   HTN (hypertension) 11/10/2019   Tobacco abuse 11/10/2019   Acute bronchitis 11/09/2019   PCP:  Patient, No Pcp Per Pharmacy:   [id number] - [place],  - [address] [place] [address] Phone: [phone] Fax: [phone]  Social Determinants of Health (SDOH) Social History: SDOH Screenings   Food Insecurity: No Food Insecurity (04/19/2023)  Housing: Medium Risk (04/19/2023)  Transportation Needs: No Transportation Needs (04/19/2023)  Utilities: Not At Risk (04/19/2023)  Tobacco Use: High Risk (04/23/2023)   Readmission Risk Interventions     No data to display

## 2023-04-25 ENCOUNTER — Inpatient Hospital Stay (HOSPITAL_COMMUNITY): Payer: Medicaid Other

## 2023-04-25 ENCOUNTER — Inpatient Hospital Stay (HOSPITAL_COMMUNITY): Payer: Medicaid Other | Admitting: Certified Registered Nurse Anesthetist

## 2023-04-25 ENCOUNTER — Other Ambulatory Visit: Payer: Self-pay

## 2023-04-25 ENCOUNTER — Inpatient Hospital Stay (HOSPITAL_COMMUNITY)
Admission: EM | Disposition: A | Discharge status: Home / Self Care | Payer: Self-pay | Source: Home / Self Care | Attending: Thoracic Surgery (Cardiothoracic Vascular Surgery)

## 2023-04-25 DIAGNOSIS — Z951 Presence of aortocoronary bypass graft: Secondary | ICD-10-CM

## 2023-04-25 DIAGNOSIS — I110 Hypertensive heart disease with heart failure: Secondary | ICD-10-CM | POA: Diagnosis not present

## 2023-04-25 DIAGNOSIS — I161 Hypertensive emergency: Secondary | ICD-10-CM | POA: Diagnosis not present

## 2023-04-25 DIAGNOSIS — J449 Chronic obstructive pulmonary disease, unspecified: Secondary | ICD-10-CM

## 2023-04-25 DIAGNOSIS — I2510 Atherosclerotic heart disease of native coronary artery without angina pectoris: Secondary | ICD-10-CM

## 2023-04-25 DIAGNOSIS — I509 Heart failure, unspecified: Secondary | ICD-10-CM | POA: Diagnosis not present

## 2023-04-25 DIAGNOSIS — Z87891 Personal history of nicotine dependence: Secondary | ICD-10-CM

## 2023-04-25 HISTORY — PX: TEE WITHOUT CARDIOVERSION: SHX5443

## 2023-04-25 HISTORY — PX: CORONARY ARTERY BYPASS GRAFT: SHX141

## 2023-04-25 HISTORY — PX: RADIAL ARTERY HARVEST: SHX5067

## 2023-04-25 LAB — CBC
HCT: 34.7 % — ABNORMAL LOW (ref 39.0–52.0)
HCT: 38.8 % — ABNORMAL LOW (ref 39.0–52.0)
Hemoglobin: 11.1 g/dL — ABNORMAL LOW (ref 13.0–17.0)
Hemoglobin: 12.8 g/dL — ABNORMAL LOW (ref 13.0–17.0)
MCH: 31.4 pg (ref 26.0–34.0)
MCH: 31.8 pg (ref 26.0–34.0)
MCHC: 32 g/dL (ref 30.0–36.0)
MCHC: 33 g/dL (ref 30.0–36.0)
MCV: 98.3 fL (ref 80.0–100.0)
MCV: 96.5 fL (ref 80.0–100.0)
Platelets: 101 K/uL — ABNORMAL LOW (ref 150–400)
Platelets: 109 10*3/uL — ABNORMAL LOW (ref 150–400)
RBC: 3.53 MIL/uL — ABNORMAL LOW (ref 4.22–5.81)
RBC: 4.02 MIL/uL — ABNORMAL LOW (ref 4.22–5.81)
RDW: 12.5 % (ref 11.5–15.5)
RDW: 12.4 % (ref 11.5–15.5)
WBC: 9.6 K/uL (ref 4.0–10.5)
WBC: 11.5 10*3/uL — ABNORMAL HIGH (ref 4.0–10.5)
nRBC: 0 % (ref 0.0–0.2)
nRBC: 0 % (ref 0.0–0.2)

## 2023-04-25 LAB — POCT I-STAT 7, (LYTES, BLD GAS, ICA,H+H)
Acid-base deficit: 6 mmol/L — ABNORMAL HIGH (ref 0.0–2.0)
Acid-base deficit: 6 mmol/L — ABNORMAL HIGH (ref 0.0–2.0)
Acid-base deficit: 6 mmol/L — ABNORMAL HIGH (ref 0.0–2.0)
Acid-base deficit: 5 mmol/L — ABNORMAL HIGH (ref 0.0–2.0)
Acid-base deficit: 5 mmol/L — ABNORMAL HIGH (ref 0.0–2.0)
Acid-base deficit: 7 mmol/L — ABNORMAL HIGH (ref 0.0–2.0)
Bicarbonate: 20.2 mmol/L (ref 20.0–28.0)
Bicarbonate: 19.4 mmol/L — ABNORMAL LOW (ref 20.0–28.0)
Bicarbonate: 20 mmol/L (ref 20.0–28.0)
Bicarbonate: 20.1 mmol/L (ref 20.0–28.0)
Bicarbonate: 20.5 mmol/L (ref 20.0–28.0)
Bicarbonate: 20 mmol/L (ref 20.0–28.0)
Calcium, Ion: 1.26 mmol/L (ref 1.15–1.40)
Calcium, Ion: 1.06 mmol/L — ABNORMAL LOW (ref 1.15–1.40)
Calcium, Ion: 1.12 mmol/L — ABNORMAL LOW (ref 1.15–1.40)
Calcium, Ion: 1.18 mmol/L (ref 1.15–1.40)
Calcium, Ion: 1.2 mmol/L (ref 1.15–1.40)
Calcium, Ion: 1.21 mmol/L (ref 1.15–1.40)
HCT: 41 % (ref 39.0–52.0)
HCT: 31 % — ABNORMAL LOW (ref 39.0–52.0)
HCT: 27 % — ABNORMAL LOW (ref 39.0–52.0)
HCT: 35 % — ABNORMAL LOW (ref 39.0–52.0)
HCT: 35 % — ABNORMAL LOW (ref 39.0–52.0)
HCT: 36 % — ABNORMAL LOW (ref 39.0–52.0)
Hemoglobin: 13.9 g/dL (ref 13.0–17.0)
Hemoglobin: 10.5 g/dL — ABNORMAL LOW (ref 13.0–17.0)
Hemoglobin: 9.2 g/dL — ABNORMAL LOW (ref 13.0–17.0)
Hemoglobin: 11.9 g/dL — ABNORMAL LOW (ref 13.0–17.0)
Hemoglobin: 11.9 g/dL — ABNORMAL LOW (ref 13.0–17.0)
Hemoglobin: 12.2 g/dL — ABNORMAL LOW (ref 13.0–17.0)
O2 Saturation: 100 %
O2 Saturation: 100 %
O2 Saturation: 99 %
O2 Saturation: 91 %
O2 Saturation: 96 %
O2 Saturation: 97 %
Patient temperature: 36.1
Patient temperature: 36.7
Patient temperature: 36.4
Potassium: 3.9 mmol/L (ref 3.5–5.1)
Potassium: 4.5 mmol/L (ref 3.5–5.1)
Potassium: 4.4 mmol/L (ref 3.5–5.1)
Potassium: 4.1 mmol/L (ref 3.5–5.1)
Potassium: 4.8 mmol/L (ref 3.5–5.1)
Potassium: 4.5 mmol/L (ref 3.5–5.1)
Sodium: 142 mmol/L (ref 135–145)
Sodium: 139 mmol/L (ref 135–145)
Sodium: 141 mmol/L (ref 135–145)
Sodium: 141 mmol/L (ref 135–145)
Sodium: 139 mmol/L (ref 135–145)
Sodium: 139 mmol/L (ref 135–145)
TCO2: 21 mmol/L — ABNORMAL LOW (ref 22–32)
TCO2: 21 mmol/L — ABNORMAL LOW (ref 22–32)
TCO2: 21 mmol/L — ABNORMAL LOW (ref 22–32)
TCO2: 21 mmol/L — ABNORMAL LOW (ref 22–32)
TCO2: 22 mmol/L (ref 22–32)
TCO2: 21 mmol/L — ABNORMAL LOW (ref 22–32)
pCO2 arterial: 40.5 mmHg (ref 32–48)
pCO2 arterial: 39.2 mmHg (ref 32–48)
pCO2 arterial: 40.9 mmHg (ref 32–48)
pCO2 arterial: 37.2 mmHg (ref 32–48)
pCO2 arterial: 38.9 mmHg (ref 32–48)
pCO2 arterial: 42.2 mmHg (ref 32–48)
pH, Arterial: 7.307 — ABNORMAL LOW (ref 7.35–7.45)
pH, Arterial: 7.304 — ABNORMAL LOW (ref 7.35–7.45)
pH, Arterial: 7.297 — ABNORMAL LOW (ref 7.35–7.45)
pH, Arterial: 7.337 — ABNORMAL LOW (ref 7.35–7.45)
pH, Arterial: 7.327 — ABNORMAL LOW (ref 7.35–7.45)
pH, Arterial: 7.28 — ABNORMAL LOW (ref 7.35–7.45)
pO2, Arterial: 377 mmHg — ABNORMAL HIGH (ref 83–108)
pO2, Arterial: 360 mmHg — ABNORMAL HIGH (ref 83–108)
pO2, Arterial: 153 mmHg — ABNORMAL HIGH (ref 83–108)
pO2, Arterial: 62 mmHg — ABNORMAL LOW (ref 83–108)
pO2, Arterial: 85 mmHg (ref 83–108)
pO2, Arterial: 96 mmHg (ref 83–108)

## 2023-04-25 LAB — POCT I-STAT, CHEM 8
BUN: 29 mg/dL — ABNORMAL HIGH (ref 6–20)
BUN: 27 mg/dL — ABNORMAL HIGH (ref 6–20)
BUN: 25 mg/dL — ABNORMAL HIGH (ref 6–20)
BUN: 25 mg/dL — ABNORMAL HIGH (ref 6–20)
BUN: 23 mg/dL — ABNORMAL HIGH (ref 6–20)
Calcium, Ion: 1.26 mmol/L (ref 1.15–1.40)
Calcium, Ion: 1.25 mmol/L (ref 1.15–1.40)
Calcium, Ion: 1.11 mmol/L — ABNORMAL LOW (ref 1.15–1.40)
Calcium, Ion: 1.13 mmol/L — ABNORMAL LOW (ref 1.15–1.40)
Calcium, Ion: 1.09 mmol/L — ABNORMAL LOW (ref 1.15–1.40)
Chloride: 108 mmol/L (ref 98–111)
Chloride: 107 mmol/L (ref 98–111)
Chloride: 106 mmol/L (ref 98–111)
Chloride: 107 mmol/L (ref 98–111)
Chloride: 107 mmol/L (ref 98–111)
Creatinine, Ser: 0.9 mg/dL (ref 0.61–1.24)
Creatinine, Ser: 0.9 mg/dL (ref 0.61–1.24)
Creatinine, Ser: 0.8 mg/dL (ref 0.61–1.24)
Creatinine, Ser: 0.8 mg/dL (ref 0.61–1.24)
Creatinine, Ser: 0.7 mg/dL (ref 0.61–1.24)
Glucose, Bld: 93 mg/dL (ref 70–99)
Glucose, Bld: 97 mg/dL (ref 70–99)
Glucose, Bld: 111 mg/dL — ABNORMAL HIGH (ref 70–99)
Glucose, Bld: 134 mg/dL — ABNORMAL HIGH (ref 70–99)
Glucose, Bld: 132 mg/dL — ABNORMAL HIGH (ref 70–99)
HCT: 43 % (ref 39.0–52.0)
HCT: 38 % — ABNORMAL LOW (ref 39.0–52.0)
HCT: 34 % — ABNORMAL LOW (ref 39.0–52.0)
HCT: 33 % — ABNORMAL LOW (ref 39.0–52.0)
HCT: 31 % — ABNORMAL LOW (ref 39.0–52.0)
Hemoglobin: 14.6 g/dL (ref 13.0–17.0)
Hemoglobin: 12.9 g/dL — ABNORMAL LOW (ref 13.0–17.0)
Hemoglobin: 11.6 g/dL — ABNORMAL LOW (ref 13.0–17.0)
Hemoglobin: 11.2 g/dL — ABNORMAL LOW (ref 13.0–17.0)
Hemoglobin: 10.5 g/dL — ABNORMAL LOW (ref 13.0–17.0)
Potassium: 4.1 mmol/L (ref 3.5–5.1)
Potassium: 4.2 mmol/L (ref 3.5–5.1)
Potassium: 5.3 mmol/L — ABNORMAL HIGH (ref 3.5–5.1)
Potassium: 5.4 mmol/L — ABNORMAL HIGH (ref 3.5–5.1)
Potassium: 4.4 mmol/L (ref 3.5–5.1)
Sodium: 141 mmol/L (ref 135–145)
Sodium: 140 mmol/L (ref 135–145)
Sodium: 140 mmol/L (ref 135–145)
Sodium: 141 mmol/L (ref 135–145)
Sodium: 140 mmol/L (ref 135–145)
TCO2: 23 mmol/L (ref 22–32)
TCO2: 23 mmol/L (ref 22–32)
TCO2: 24 mmol/L (ref 22–32)
TCO2: 23 mmol/L (ref 22–32)
TCO2: 21 mmol/L — ABNORMAL LOW (ref 22–32)

## 2023-04-25 LAB — BASIC METABOLIC PANEL
Anion gap: 7 (ref 5–15)
BUN: 20 mg/dL (ref 6–20)
CO2: 21 mmol/L — ABNORMAL LOW (ref 22–32)
Calcium: 7.8 mg/dL — ABNORMAL LOW (ref 8.9–10.3)
Chloride: 108 mmol/L (ref 98–111)
Creatinine, Ser: 0.88 mg/dL (ref 0.61–1.24)
GFR, Estimated: >60 mL/min (ref >60)
Glucose, Bld: 158 mg/dL — ABNORMAL HIGH (ref 70–99)
Potassium: 5.4 mmol/L — ABNORMAL HIGH (ref 3.5–5.1)
Sodium: 136 mmol/L (ref 135–145)

## 2023-04-25 LAB — GLUCOSE, CAPILLARY
Glucose-Capillary: 111 mg/dL — ABNORMAL HIGH (ref 70–99)
Glucose-Capillary: 100 mg/dL — ABNORMAL HIGH (ref 70–99)
Glucose-Capillary: 111 mg/dL — ABNORMAL HIGH (ref 70–99)
Glucose-Capillary: 127 mg/dL — ABNORMAL HIGH (ref 70–99)
Glucose-Capillary: 106 mg/dL — ABNORMAL HIGH (ref 70–99)
Glucose-Capillary: 123 mg/dL — ABNORMAL HIGH (ref 70–99)
Glucose-Capillary: 146 mg/dL — ABNORMAL HIGH (ref 70–99)
Glucose-Capillary: 167 mg/dL — ABNORMAL HIGH (ref 70–99)
Glucose-Capillary: 141 mg/dL — ABNORMAL HIGH (ref 70–99)

## 2023-04-25 LAB — POCT I-STAT EG7
Acid-base deficit: 4 mmol/L — ABNORMAL HIGH (ref 0.0–2.0)
Bicarbonate: 22.1 mmol/L (ref 20.0–28.0)
Calcium, Ion: 1.14 mmol/L — ABNORMAL LOW (ref 1.15–1.40)
HCT: 32 % — ABNORMAL LOW (ref 39.0–52.0)
Hemoglobin: 10.9 g/dL — ABNORMAL LOW (ref 13.0–17.0)
O2 Saturation: 83 %
Potassium: 4.4 mmol/L (ref 3.5–5.1)
Sodium: 140 mmol/L (ref 135–145)
TCO2: 23 mmol/L (ref 22–32)
pCO2, Ven: 44.6 mmHg (ref 44–60)
pH, Ven: 7.303 (ref 7.25–7.43)
pO2, Ven: 52 mmHg — ABNORMAL HIGH (ref 32–45)

## 2023-04-25 LAB — APTT: aPTT: 38 seconds — ABNORMAL HIGH (ref 24–36)

## 2023-04-25 LAB — PLATELET COUNT: Platelets: 106 10*3/uL — ABNORMAL LOW (ref 150–400)

## 2023-04-25 LAB — PROTIME-INR
INR: 1.5 — ABNORMAL HIGH (ref 0.8–1.2)
Prothrombin Time: 18 seconds — ABNORMAL HIGH (ref 11.4–15.2)

## 2023-04-25 LAB — HEMOGLOBIN AND HEMATOCRIT, BLOOD
HCT: 33.4 % — ABNORMAL LOW (ref 39.0–52.0)
Hemoglobin: 11.2 g/dL — ABNORMAL LOW (ref 13.0–17.0)

## 2023-04-25 LAB — MAGNESIUM: Magnesium: 2.7 mg/dL — ABNORMAL HIGH (ref 1.7–2.4)

## 2023-04-25 SURGERY — CORONARY ARTERY BYPASS GRAFTING (CABG)
Anesthesia: General | Site: Chest

## 2023-04-25 MED ORDER — PROTAMINE SULFATE 10 MG/ML IV SOLN
INTRAVENOUS | Status: AC
  Filled 2023-04-25: qty 25

## 2023-04-25 MED ORDER — ARTIFICIAL TEARS OPHTHALMIC OINT
TOPICAL_OINTMENT | OPHTHALMIC | Status: DC | PRN
  Administered 2023-04-25: 1 via OPHTHALMIC

## 2023-04-25 MED ORDER — BISACODYL 10 MG RE SUPP
10.0000 mg | Freq: Every day | RECTAL | Status: DC
  Filled 2023-04-25 (×2): qty 1

## 2023-04-25 MED ORDER — CEFAZOLIN SODIUM-DEXTROSE 2-4 GM/100ML-% IV SOLN
2.0000 g | Freq: Three times a day (TID) | INTRAVENOUS | Status: AC
  Administered 2023-04-25 – 2023-04-27 (×6): 2 g via INTRAVENOUS
  Filled 2023-04-25 (×7): qty 100

## 2023-04-25 MED ORDER — ROCURONIUM BROMIDE 10 MG/ML (PF) SYRINGE
PREFILLED_SYRINGE | INTRAVENOUS | Status: AC
  Filled 2023-04-25: qty 10

## 2023-04-25 MED ORDER — MIDAZOLAM HCL (PF) 10 MG/2ML IJ SOLN
INTRAMUSCULAR | Status: AC
  Filled 2023-04-25: qty 2

## 2023-04-25 MED ORDER — SODIUM CHLORIDE 0.9 % IV SOLN: INTRAVENOUS | Status: DC | PRN

## 2023-04-25 MED ORDER — PHENYLEPHRINE HCL-NACL 20-0.9 MG/250ML-% IV SOLN: 0.0000 ug/min | INTRAVENOUS | Status: DC

## 2023-04-25 MED ORDER — LACTATED RINGERS IV SOLN: 500.0000 mL | Freq: Once | INTRAVENOUS | Status: DC | PRN

## 2023-04-25 MED ORDER — PROPOFOL 10 MG/ML IV BOLUS
INTRAVENOUS | Status: DC | PRN
  Administered 2023-04-25: 60 mg via INTRAVENOUS

## 2023-04-25 MED ORDER — FENTANYL CITRATE (PF) 250 MCG/5ML IJ SOLN
INTRAMUSCULAR | Status: AC
  Filled 2023-04-25: qty 5

## 2023-04-25 MED ORDER — DOCUSATE SODIUM 100 MG PO CAPS
200.0000 mg | ORAL_CAPSULE | Freq: Every day | ORAL | Status: DC
  Administered 2023-04-26 – 2023-05-01 (×5): 200 mg via ORAL
  Filled 2023-04-25 (×6): qty 2

## 2023-04-25 MED ORDER — ORAL CARE MOUTH RINSE: 15.0000 mL | OROMUCOSAL | Status: DC | PRN

## 2023-04-25 MED ORDER — NITROGLYCERIN IN D5W 200-5 MCG/ML-% IV SOLN: 0.0000 ug/min | INTRAVENOUS | Status: DC

## 2023-04-25 MED ORDER — ACETAMINOPHEN 650 MG RE SUPP
650.0000 mg | Freq: Once | RECTAL | Status: AC
  Administered 2023-04-25: 650 mg via RECTAL

## 2023-04-25 MED ORDER — ROCURONIUM BROMIDE 10 MG/ML (PF) SYRINGE
PREFILLED_SYRINGE | INTRAVENOUS | Status: AC
  Filled 2023-04-25: qty 20

## 2023-04-25 MED ORDER — 0.9 % SODIUM CHLORIDE (POUR BTL) OPTIME
TOPICAL | Status: DC | PRN
  Administered 2023-04-25: 5000 mL

## 2023-04-25 MED ORDER — HEMOSTATIC AGENTS (NO CHARGE) OPTIME
TOPICAL | Status: DC | PRN
  Administered 2023-04-25: 1 via TOPICAL

## 2023-04-25 MED ORDER — DEXMEDETOMIDINE HCL IN NACL 400 MCG/100ML IV SOLN: 0.0000 ug/kg/h | INTRAVENOUS | Status: DC

## 2023-04-25 MED ORDER — ARTIFICIAL TEARS OPHTHALMIC OINT
TOPICAL_OINTMENT | OPHTHALMIC | Status: AC
  Filled 2023-04-25: qty 3.5

## 2023-04-25 MED ORDER — EPHEDRINE SULFATE-NACL 50-0.9 MG/10ML-% IV SOSY
PREFILLED_SYRINGE | INTRAVENOUS | Status: DC | PRN
  Administered 2023-04-25 (×3): 5 mg via INTRAVENOUS

## 2023-04-25 MED ORDER — MIDAZOLAM HCL 2 MG/2ML IJ SOLN: 2.0000 mg | INTRAMUSCULAR | Status: DC | PRN

## 2023-04-25 MED ORDER — SODIUM CHLORIDE 0.9 % IV SOLN: INTRAVENOUS | Status: DC

## 2023-04-25 MED ORDER — ALBUMIN HUMAN 5 % IV SOLN: INTRAVENOUS | Status: DC | PRN

## 2023-04-25 MED ORDER — METOPROLOL TARTRATE 12.5 MG HALF TABLET
12.5000 mg | ORAL_TABLET | Freq: Two times a day (BID) | ORAL | Status: DC
  Administered 2023-04-26: 12.5 mg via ORAL
  Filled 2023-04-25 (×2): qty 1

## 2023-04-25 MED ORDER — ACETAMINOPHEN 160 MG/5ML PO SOLN
1000.0000 mg | Freq: Four times a day (QID) | ORAL | Status: AC
  Filled 2023-04-25: qty 40.6

## 2023-04-25 MED ORDER — CHLORHEXIDINE GLUCONATE CLOTH 2 % EX PADS
6.0000 | MEDICATED_PAD | Freq: Every day | CUTANEOUS | Status: DC
  Administered 2023-04-25 – 2023-04-30 (×5): 6 via TOPICAL

## 2023-04-25 MED ORDER — PHENYLEPHRINE 80 MCG/ML (10ML) SYRINGE FOR IV PUSH (FOR BLOOD PRESSURE SUPPORT)
PREFILLED_SYRINGE | INTRAVENOUS | Status: AC
  Filled 2023-04-25: qty 20

## 2023-04-25 MED ORDER — SODIUM CHLORIDE 0.45 % IV SOLN: INTRAVENOUS | Status: DC | PRN

## 2023-04-25 MED ORDER — SODIUM CHLORIDE 0.9% FLUSH
3.0000 mL | INTRAVENOUS | Status: DC | PRN
  Administered 2023-04-26 (×2): 3 mL via INTRAVENOUS

## 2023-04-25 MED ORDER — MAGNESIUM SULFATE 4 GM/100ML IV SOLN
4.0000 g | Freq: Once | INTRAVENOUS | Status: AC
  Administered 2023-04-25: 4 g via INTRAVENOUS
  Filled 2023-04-25: qty 100

## 2023-04-25 MED ORDER — INSULIN REGULAR(HUMAN) IN NACL 100-0.9 UT/100ML-% IV SOLN: INTRAVENOUS | Status: DC

## 2023-04-25 MED ORDER — METOPROLOL TARTRATE 5 MG/5ML IV SOLN
2.5000 mg | INTRAVENOUS | Status: DC | PRN
  Administered 2023-04-28 – 2023-04-29 (×2): 5 mg via INTRAVENOUS
  Filled 2023-04-25 (×4): qty 5

## 2023-04-25 MED ORDER — OXYCODONE HCL 5 MG PO TABS
5.0000 mg | ORAL_TABLET | ORAL | Status: DC | PRN
  Administered 2023-04-25: 10 mg via ORAL
  Filled 2023-04-25: qty 2

## 2023-04-25 MED ORDER — LACTATED RINGERS IV SOLN: INTRAVENOUS | Status: DC | PRN

## 2023-04-25 MED ORDER — TRAMADOL HCL 50 MG PO TABS
50.0000 mg | ORAL_TABLET | ORAL | Status: DC | PRN
  Administered 2023-04-25: 50 mg via ORAL
  Administered 2023-04-26 (×3): 100 mg via ORAL
  Administered 2023-04-26 – 2023-04-29 (×3): 50 mg via ORAL
  Filled 2023-04-25: qty 2
  Filled 2023-04-25 (×2): qty 1
  Filled 2023-04-25 (×3): qty 2
  Filled 2023-04-25 (×2): qty 1

## 2023-04-25 MED ORDER — ACETAMINOPHEN 160 MG/5ML PO SOLN: 650.0000 mg | Freq: Once | ORAL | Status: AC

## 2023-04-25 MED ORDER — PHENYLEPHRINE 80 MCG/ML (10ML) SYRINGE FOR IV PUSH (FOR BLOOD PRESSURE SUPPORT)
PREFILLED_SYRINGE | INTRAVENOUS | Status: DC | PRN
  Administered 2023-04-25: 160 ug via INTRAVENOUS
  Administered 2023-04-25 (×3): 80 ug via INTRAVENOUS
  Administered 2023-04-25: 40 ug via INTRAVENOUS
  Administered 2023-04-25 (×2): 80 ug via INTRAVENOUS
  Administered 2023-04-25: 40 ug via INTRAVENOUS
  Administered 2023-04-25: 80 ug via INTRAVENOUS

## 2023-04-25 MED ORDER — PANTOPRAZOLE SODIUM 40 MG PO TBEC
40.0000 mg | DELAYED_RELEASE_TABLET | Freq: Every day | ORAL | Status: DC
  Administered 2023-04-27 – 2023-05-01 (×5): 40 mg via ORAL
  Filled 2023-04-25 (×5): qty 1

## 2023-04-25 MED ORDER — ROCURONIUM BROMIDE 10 MG/ML (PF) SYRINGE
PREFILLED_SYRINGE | INTRAVENOUS | Status: DC | PRN
  Administered 2023-04-25 (×3): 50 mg via INTRAVENOUS
  Administered 2023-04-25: 100 mg via INTRAVENOUS

## 2023-04-25 MED ORDER — CHLORHEXIDINE GLUCONATE 0.12 % MT SOLN
15.0000 mL | OROMUCOSAL | Status: AC
  Administered 2023-04-25: 15 mL via OROMUCOSAL

## 2023-04-25 MED ORDER — ASPIRIN 325 MG PO TBEC
325.0000 mg | DELAYED_RELEASE_TABLET | Freq: Every day | ORAL | Status: DC
  Administered 2023-04-26 – 2023-04-29 (×4): 325 mg via ORAL
  Filled 2023-04-25 (×4): qty 1

## 2023-04-25 MED ORDER — BISACODYL 5 MG PO TBEC
10.0000 mg | DELAYED_RELEASE_TABLET | Freq: Every day | ORAL | Status: DC
  Administered 2023-04-26 – 2023-05-01 (×5): 10 mg via ORAL
  Filled 2023-04-25 (×5): qty 2

## 2023-04-25 MED ORDER — SODIUM CHLORIDE 0.9 % IV SOLN: 250.0000 mL | INTRAVENOUS | Status: DC

## 2023-04-25 MED ORDER — NITROGLYCERIN IN D5W 200-5 MCG/ML-% IV SOLN
7.0000 ug/min | INTRAVENOUS | Status: DC
  Administered 2023-04-26: 100 ug/min via INTRAVENOUS
  Administered 2023-04-26: 85 ug/min via INTRAVENOUS
  Filled 2023-04-25 (×2): qty 250

## 2023-04-25 MED ORDER — ASPIRIN 81 MG PO CHEW
324.0000 mg | CHEWABLE_TABLET | Freq: Every day | ORAL | Status: DC
  Filled 2023-04-25 (×2): qty 4

## 2023-04-25 MED ORDER — FENTANYL CITRATE (PF) 250 MCG/5ML IJ SOLN
INTRAMUSCULAR | Status: DC | PRN
  Administered 2023-04-25: 250 ug via INTRAVENOUS
  Administered 2023-04-25: 400 ug via INTRAVENOUS
  Administered 2023-04-25: 500 ug via INTRAVENOUS
  Administered 2023-04-25 (×2): 50 ug via INTRAVENOUS

## 2023-04-25 MED ORDER — PROPOFOL 10 MG/ML IV BOLUS
INTRAVENOUS | Status: AC
  Filled 2023-04-25: qty 20

## 2023-04-25 MED ORDER — SODIUM CHLORIDE 0.9% FLUSH
3.0000 mL | Freq: Two times a day (BID) | INTRAVENOUS | Status: DC
  Administered 2023-04-26 – 2023-05-01 (×11): 3 mL via INTRAVENOUS

## 2023-04-25 MED ORDER — ALBUMIN HUMAN 5 % IV SOLN
250.0000 mL | INTRAVENOUS | Status: AC | PRN
  Administered 2023-04-25: 12.5 g via INTRAVENOUS

## 2023-04-25 MED ORDER — ONDANSETRON HCL 4 MG/2ML IJ SOLN
4.0000 mg | Freq: Four times a day (QID) | INTRAMUSCULAR | Status: DC | PRN
  Administered 2023-04-25 – 2023-04-26 (×4): 4 mg via INTRAVENOUS
  Filled 2023-04-25 (×5): qty 2

## 2023-04-25 MED ORDER — HEPARIN SODIUM (PORCINE) 1000 UNIT/ML IJ SOLN
INTRAMUSCULAR | Status: DC | PRN
  Administered 2023-04-25: 21000 [IU] via INTRAVENOUS
  Administered 2023-04-25: 2000 [IU] via INTRAVENOUS
  Administered 2023-04-25: 5000 [IU] via INTRAVENOUS

## 2023-04-25 MED ORDER — HEPARIN SODIUM (PORCINE) 1000 UNIT/ML IJ SOLN
INTRAMUSCULAR | Status: AC
  Filled 2023-04-25: qty 1

## 2023-04-25 MED ORDER — SODIUM CHLORIDE (PF) 0.9 % IJ SOLN: OROMUCOSAL | Status: DC | PRN

## 2023-04-25 MED ORDER — PROTAMINE SULFATE 10 MG/ML IV SOLN
INTRAVENOUS | Status: DC | PRN
  Administered 2023-04-25: 230 mg via INTRAVENOUS

## 2023-04-25 MED ORDER — ACETAMINOPHEN 500 MG PO TABS
1000.0000 mg | ORAL_TABLET | Freq: Four times a day (QID) | ORAL | Status: AC
  Administered 2023-04-25 – 2023-04-30 (×18): 1000 mg via ORAL
  Filled 2023-04-25 (×16): qty 2

## 2023-04-25 MED ORDER — ATORVASTATIN CALCIUM 40 MG PO TABS
40.0000 mg | ORAL_TABLET | Freq: Every day | ORAL | Status: DC
  Administered 2023-04-26 – 2023-05-01 (×6): 40 mg via ORAL
  Filled 2023-04-25 (×6): qty 1

## 2023-04-25 MED ORDER — LACTATED RINGERS IV SOLN: INTRAVENOUS | Status: DC

## 2023-04-25 MED ORDER — DEXTROSE 50 % IV SOLN: 0.0000 mL | INTRAVENOUS | Status: DC | PRN

## 2023-04-25 MED ORDER — POTASSIUM CHLORIDE 10 MEQ/50ML IV SOLN: 10.0000 meq | INTRAVENOUS | Status: AC

## 2023-04-25 MED ORDER — FAMOTIDINE IN NACL 20-0.9 MG/50ML-% IV SOLN
20.0000 mg | Freq: Two times a day (BID) | INTRAVENOUS | Status: DC
  Administered 2023-04-25: 20 mg via INTRAVENOUS
  Filled 2023-04-25: qty 50

## 2023-04-25 MED ORDER — VANCOMYCIN HCL IN DEXTROSE 1-5 GM/200ML-% IV SOLN
1000.0000 mg | Freq: Once | INTRAVENOUS | Status: AC
  Administered 2023-04-25: 1000 mg via INTRAVENOUS
  Filled 2023-04-25: qty 200

## 2023-04-25 MED ORDER — PLASMA-LYTE A IV SOLN: INTRAVENOUS | Status: DC | PRN

## 2023-04-25 MED ORDER — MIDAZOLAM HCL (PF) 5 MG/ML IJ SOLN
INTRAMUSCULAR | Status: DC | PRN
  Administered 2023-04-25: 5 mg via INTRAVENOUS
  Administered 2023-04-25: 3 mg via INTRAVENOUS
  Administered 2023-04-25: 2 mg via INTRAVENOUS
  Administered 2023-04-25: 3 mg via INTRAVENOUS
  Administered 2023-04-25 (×2): 1 mg via INTRAVENOUS
  Administered 2023-04-25: 5 mg via INTRAVENOUS

## 2023-04-25 MED ORDER — METOPROLOL TARTRATE 25 MG/10 ML ORAL SUSPENSION
12.5000 mg | Freq: Two times a day (BID) | ORAL | Status: DC
  Filled 2023-04-25: qty 10

## 2023-04-25 MED ORDER — MORPHINE SULFATE (PF) 2 MG/ML IV SOLN
1.0000 mg | INTRAVENOUS | Status: DC | PRN
  Administered 2023-04-25 – 2023-04-27 (×5): 2 mg via INTRAVENOUS
  Filled 2023-04-25 (×5): qty 1

## 2023-04-25 SURGICAL SUPPLY — 115 items
ADH SKN CLS APL DERMABOND .7 (GAUZE/BANDAGES/DRESSINGS) ×6
ANTIFOG SOL W/FOAM PAD STRL (MISCELLANEOUS) ×3
APPLIER CLIP 9.375 SM OPEN (CLIP)
APR CLP SM 9.3 20 MLT OPN (CLIP)
BAG DECANTER FOR FLEXI CONT (MISCELLANEOUS) ×3 IMPLANT
BLADE CLIPPER SURG (BLADE) ×3 IMPLANT
BLADE STERNUM SYSTEM 6 (BLADE) ×3 IMPLANT
BLADE SURG 11 STRL SS (BLADE) IMPLANT
BLADE SURG 15 STRL LF DISP TIS (BLADE) IMPLANT
BLADE SURG 15 STRL SS (BLADE) ×3
BNDG CMPR 5X4 KNIT ELC UNQ LF (GAUZE/BANDAGES/DRESSINGS) ×6
BNDG CMPR 5X62 HK CLSR LF (GAUZE/BANDAGES/DRESSINGS) ×3
BNDG ELASTIC 4INX 5YD STR LF (GAUZE/BANDAGES/DRESSINGS) IMPLANT
BNDG ELASTIC 4X5.8 VLCR STR LF (GAUZE/BANDAGES/DRESSINGS) ×3 IMPLANT
BNDG ELASTIC 6INX 5YD STR LF (GAUZE/BANDAGES/DRESSINGS) IMPLANT
BNDG ELASTIC 6X5.8 VLCR STR LF (GAUZE/BANDAGES/DRESSINGS) ×3 IMPLANT
BNDG GAUZE DERMACEA FLUFF 4 (GAUZE/BANDAGES/DRESSINGS) ×3 IMPLANT
BNDG GZE DERMACEA 4 6PLY (GAUZE/BANDAGES/DRESSINGS) ×6
CANISTER SUCT 3000ML PPV (MISCELLANEOUS) ×3 IMPLANT
CANNULA EZ GLIDE AORTIC 21FR (CANNULA) ×3 IMPLANT
CANNULA MC2 2 STG 36/46 NON-V (CANNULA) IMPLANT
CANNULA VESSEL 3MM BLUNT TIP (CANNULA) IMPLANT
CATH CPB KIT HENDRICKSON (MISCELLANEOUS) ×3 IMPLANT
CATH ROBINSON RED A/P 18FR (CATHETERS) ×3 IMPLANT
CATH THORACIC 36FR (CATHETERS) ×3 IMPLANT
CATH THORACIC 36FR RT ANG (CATHETERS) ×3 IMPLANT
CLIP APPLIE 9.375 SM OPEN (CLIP) IMPLANT
CLIP FOGARTY SPRING 6M (CLIP) IMPLANT
CLIP TI MEDIUM 24 (CLIP) IMPLANT
CLIP TI WIDE RED SMALL 24 (CLIP) IMPLANT
CONTAINER PROTECT SURGISLUSH (MISCELLANEOUS) ×6 IMPLANT
COVER MAYO STAND STRL (DRAPES) IMPLANT
CUFF TOURN SGL QUICK 18X4 (TOURNIQUET CUFF) IMPLANT
CUFF TOURN SGL QUICK 24 (TOURNIQUET CUFF)
CUFF TRNQT CYL 24X4X16.5-23 (TOURNIQUET CUFF) IMPLANT
DERMABOND ADVANCED .7 DNX12 (GAUZE/BANDAGES/DRESSINGS) IMPLANT
DRAPE CARDIOVASCULAR INCISE (DRAPES) ×3
DRAPE EXTREMITY T 121X128X90 (DISPOSABLE) ×3 IMPLANT
DRAPE HALF SHEET 40X57 (DRAPES) IMPLANT
DRAPE SRG 135X102X78XABS (DRAPES) ×3 IMPLANT
DRAPE WARM FLUID 44X44 (DRAPES) ×3 IMPLANT
DRSG COVADERM 4X14 (GAUZE/BANDAGES/DRESSINGS) ×3 IMPLANT
ELECT REM PT RETURN 9FT ADLT (ELECTROSURGICAL) ×6
ELECTRODE REM PT RTRN 9FT ADLT (ELECTROSURGICAL) ×6 IMPLANT
FELT TEFLON 1X6 (MISCELLANEOUS) ×6 IMPLANT
GAUZE 4X4 16PLY ~~LOC~~+RFID DBL (SPONGE) ×3 IMPLANT
GAUZE SPONGE 4X4 12PLY STRL (GAUZE/BANDAGES/DRESSINGS) ×6 IMPLANT
GEL ULTRASOUND 20GR AQUASONIC (MISCELLANEOUS) IMPLANT
GLOVE BIOGEL PI IND STRL 6 (GLOVE) IMPLANT
GLOVE BIOGEL PI IND STRL 7.0 (GLOVE) IMPLANT
GLOVE SS BIOGEL STRL SZ 7.5 (GLOVE) ×3 IMPLANT
GLOVE SURG SIGNA 7.5 PF LTX (GLOVE) ×9 IMPLANT
GOWN STRL REUS W/ TWL LRG LVL3 (GOWN DISPOSABLE) ×12 IMPLANT
GOWN STRL REUS W/ TWL XL LVL3 (GOWN DISPOSABLE) ×6 IMPLANT
GOWN STRL REUS W/TWL LRG LVL3 (GOWN DISPOSABLE) ×12
GOWN STRL REUS W/TWL XL LVL3 (GOWN DISPOSABLE) ×6
HEMOSTAT POWDER SURGIFOAM 1G (HEMOSTASIS) ×9 IMPLANT
HEMOSTAT SURGICEL 2X14 (HEMOSTASIS) ×3 IMPLANT
INSERT FOGARTY XLG (MISCELLANEOUS) IMPLANT
KIT BASIN OR (CUSTOM PROCEDURE TRAY) ×3 IMPLANT
KIT SUCTION CATH 14FR (SUCTIONS) ×6 IMPLANT
KIT TURNOVER KIT B (KITS) ×3 IMPLANT
KIT VASOVIEW HEMOPRO 2 VH 4000 (KITS) ×3 IMPLANT
MARKER GRAFT CORONARY BYPASS (MISCELLANEOUS) ×9 IMPLANT
NS IRRIG 1000ML POUR BTL (IV SOLUTION) ×15 IMPLANT
PACK E OPEN HEART (SUTURE) ×3 IMPLANT
PACK OPEN HEART (CUSTOM PROCEDURE TRAY) ×3 IMPLANT
PAD ARMBOARD 7.5X6 YLW CONV (MISCELLANEOUS) ×6 IMPLANT
PAD ELECT DEFIB RADIOL ZOLL (MISCELLANEOUS) ×3 IMPLANT
PENCIL BUTTON HOLSTER BLD 10FT (ELECTRODE) ×3 IMPLANT
POSITIONER HEAD DONUT 9IN (MISCELLANEOUS) ×3 IMPLANT
POUCH SELF SEAL 8IN X 16IN (STERILIZATION PRODUCTS) IMPLANT
PUNCH AORTIC ROTATE 4.0MM (MISCELLANEOUS) IMPLANT
PUNCH AORTIC ROTATE 4.5MM 8IN (MISCELLANEOUS) IMPLANT
PUNCH AORTIC ROTATE 5MM 8IN (MISCELLANEOUS) IMPLANT
SET MPS 3-ND DEL (MISCELLANEOUS) IMPLANT
SHEARS HARMONIC 9CM CVD (BLADE) ×3 IMPLANT
SOLUTION ANTFG W/FOAM PAD STRL (MISCELLANEOUS) IMPLANT
SPONGE T-LAP 18X18 ~~LOC~~+RFID (SPONGE) ×12 IMPLANT
SPONGE T-LAP 4X18 ~~LOC~~+RFID (SPONGE) ×3 IMPLANT
SUPPORT HEART JANKE-BARRON (MISCELLANEOUS) ×3 IMPLANT
SUT BONE WAX W31G (SUTURE) ×3 IMPLANT
SUT MNCRL AB 4-0 PS2 18 (SUTURE) IMPLANT
SUT PROLENE 3 0 SH DA (SUTURE) ×3 IMPLANT
SUT PROLENE 4 0 RB 1 (SUTURE) ×6
SUT PROLENE 4 0 SH DA (SUTURE) IMPLANT
SUT PROLENE 4-0 RB1 .5 CRCL 36 (SUTURE) IMPLANT
SUT PROLENE 5 0 C 1 36 (SUTURE) IMPLANT
SUT PROLENE 6 0 C 1 30 (SUTURE) ×6 IMPLANT
SUT PROLENE 7 0 BV 1 (SUTURE) IMPLANT
SUT PROLENE 7 0 BV1 MDA (SUTURE) ×3 IMPLANT
SUT PROLENE 8 0 BV175 6 (SUTURE) IMPLANT
SUT SILK 4 0 TIE 10X30 (SUTURE) IMPLANT
SUT STEEL 6MS V (SUTURE) ×3 IMPLANT
SUT STEEL STERNAL CCS#1 18IN (SUTURE) IMPLANT
SUT STEEL SZ 6 DBL 3X14 BALL (SUTURE) ×3 IMPLANT
SUT VIC AB 1 CTX 36 (SUTURE) ×6
SUT VIC AB 1 CTX36XBRD ANBCTR (SUTURE) ×6 IMPLANT
SUT VIC AB 2-0 CT1 27 (SUTURE) ×6
SUT VIC AB 2-0 CT1 TAPERPNT 27 (SUTURE) IMPLANT
SUT VIC AB 2-0 CTX 27 (SUTURE) IMPLANT
SUT VIC AB 3-0 SH 27 (SUTURE)
SUT VIC AB 3-0 SH 27X BRD (SUTURE) IMPLANT
SUT VIC AB 3-0 X1 27 (SUTURE) IMPLANT
SUT VICRYL 4-0 PS2 18IN ABS (SUTURE) IMPLANT
SYR 50ML SLIP (SYRINGE) IMPLANT
SYSTEM SAHARA CHEST DRAIN ATS (WOUND CARE) ×3 IMPLANT
TAPE CLOTH SURG 4X10 WHT LF (GAUZE/BANDAGES/DRESSINGS) IMPLANT
TAPE PAPER 2X10 WHT MICROPORE (GAUZE/BANDAGES/DRESSINGS) IMPLANT
TOWEL GREEN STERILE (TOWEL DISPOSABLE) ×3 IMPLANT
TOWEL GREEN STERILE FF (TOWEL DISPOSABLE) ×3 IMPLANT
TRAY FOLEY SLVR 16FR TEMP STAT (SET/KITS/TRAYS/PACK) ×3 IMPLANT
TUBING LAP HI FLOW INSUFFLATIO (TUBING) ×3 IMPLANT
UNDERPAD 30X36 HEAVY ABSORB (UNDERPADS AND DIAPERS) ×3 IMPLANT
WATER STERILE IRR 1000ML POUR (IV SOLUTION) ×6 IMPLANT

## 2023-04-25 NOTE — Hospital Course (Addendum)
History of Present Illness:     This is a 47 year old male with a past medical history of hypertension, tobacco abuse, marijuana abuse who presented to [facility] ED with worsening shortness of breath and chest pain 04/19/2023. He denies syncope, diaphoresis, but has had intermittent ankle edema. Of note, he has been non compliant with medications for over one year. He was found to have a hypertensive emergency (BP 228/156), Troponin I (high sensitivity) max 102, and BNP 495. He was given IV Labetalol and cardiology was consulted.  CTA showed no evidence of aortic dissection but was noted to have mild dilation of the ascending aorta measuring 4.1 cm and Interlobular septal thickening in both lungs with a small right pleural effusion and patchy ground-glass opacities in the right upper and lower lobes. Echo done 04/19/2023 showed LVEF 35-40%, LV with global hypokinesis, mild MR, mild AI, mild TR, and aortic dilatation 40 mm. Cardiac catheterization done 04/22/2023 showed ostial LAD to proximal LAD lesion is 95% stenosed, mid LAD with 85% stenosis, mid to distal Circumflex with a 75% stenosis, and proximal to distal RCA with 100% stenosis. Cardiothoracic consultation was requested for consideration of coronary artery disease.  Patient works at [employer] and eats there daily (has a high salt diet.) Per patient, he was previously able to walk for several miles a day but now gets short of breath with walking minimal distance. At the time of my exam, he denies chest pain.  Dr. Leafy Ro and Dr. Dorris Fetch reviewed the patient's diagnostic studies and determined the patient would benefit from surgical intervention. The treatment options as well as the risks and benefits of surgery were discussed with the patient. Mr. Newbern was agreeable to proceed with surgery.  Hospital Course: Mr. Schertzer was admitted to [facility] on 04/20 and remained in stable condition until he was brought to the operating  room on 04/25/23. He underwent CABG x 5 utilizing LIMA to LAD, SVG sequential to OM1 and OM2, SVG to Diagonal, and Radial Artery to PDA. He tolerated the procedure well and was transferred to the SICU in stable condition. He was extubated the evening of surgery without complication. Drips were weaned as hemodynamics tolerated. Swan ganz catheter and chest tubes were removed without complication. He was started on Norvasc and Imdur for his radial artery conduit.  He was felt stable for transfer to 4E Progressive Care on post-op day 2 but remained in the ICU due to bed unavailability.  He developed atrial fibrillation on post-op day 3 while still in the ICU and was started on oral amiodarone loading. He was followed by Dr. Antoine Poche for his systolic heart failure and was started on Entresto and Farxiga. He remained in NSR with bursts of NSVT and atrial fibrillation with RVR. He was hypertensive, Coreg was titrated. His epicardial pacing wires were removed without complication. He had another burst of atrial fibrillation with RVR, he converted to NSR with a bolus of Amiodarone, oral Amiodarone was continued. He was started on Eliquis for stroke prevention. He reported that he does not have a PCP, the transition of care team was consulted for assistance and PCP was arranged. His heart rate and rhythm remained stable. He has a history of COPD, he developed a minimally productive cough and was given Mucinex to assist with sputum production. Incentive spirometry, ambulation and home inhalers were also encouraged. He continued to saturate well on room air. He was ambulating well and his incisions were healing well without sign of  infection. He was felt stable for discharge home.

## 2023-04-25 NOTE — Progress Notes (Addendum)
PROGRESS NOTE    Caleb Perkins  ZOX:[id number] DOB: 05-15-1976 DOA: 04/19/2023  PCP: Patient, No Pcp Per   Brief Narrative:  This 47 y.o. male, with history of hypertension, noncompliant with home medications who has not taken his medications for the last 1-1/2 years, came to hospital with worsening shortness of breath and chest pain. In the ED he was found to be in hypertensive emergency with elevated troponin, BNP. Patient was admitted with acute diastolic CHF exacerbation in the setting of hypertensive emergency . He was transferred to [facility] for cardiac catheterization.  Left heart cath shows multivessel disease.  Cardiothoracic surgery consulted, Patient underwent CABG.  Assessment & Plan:   Principal Problem:   Hypertensive emergency Active Problems:   Cardiomyopathy due to hypertension, with heart failure   Pulmonary HTN   Coronary artery calcification   Malnutrition of moderate degree   S/P CABG x 5  Acute systolic and diastolic heart failure/ Ischemic cardiomyopathy : Echocardiogram this admission showed LVEF 35 to 40% global hypokinesis, grade 2 diastolic dysfunction, normal RV size and function, mild biatrial enlargement,.  Continue with coreg 12.5 mg BID, Entresto 24-26 mg BID, Spironolactone 25 mg daily.    Elevated troponin in the setting of CAD: Patient  underwent cardiac catheterization on 4/22 showing severe three vessel CAD,. He remains on IV heparin, Aspirin, coreg, imdur and entresto and spironolactone.  Cardiothoracic surgery consulted for further recommendations.  Pulmonary function tests done. Patient is s/p CABG,  remains intubated.  Seen in the 2H. Patient will remain under cardiothoracic surgical team.  Will sign off.   Hypertensive emergency Secondary to non compliance to medications.  BP parameters have improved.  Continue Coreg, Entresto and spironolactone.   Hyperlipidemia:  Continue Lipitor 80 mg daily     Mild thrombocytopenia.  Stable,.    Estimated body mass index is 20.69 kg/m as calculated from the following:   Height as of this encounter:  (1.778 m).   Weight as of this encounter: 65.4 kg.     DVT prophylaxis: Heparin IV Code Status: Full code. Family Communication:No family at bed side. Disposition Plan:    Status is: Inpatient Remains inpatient appropriate because: Patient admitted for ischemic cardiomyopathy, left heart cath shows multivessel disease underwent CABG.  Patient remains under cardiothoracic surgery team.    Consultants:  Cardiology Cardiothoracic surgery  Procedures: Echo, LHC  Antimicrobials:  Anti-infectives (From admission, onward)    Start     Dose/Rate Route Frequency Ordered Stop   04/25/23 2100  ceFAZolin (ANCEF) IVPB 2g/100 mL premix        2 g 200 mL/hr over 30 Minutes Intravenous Every 8 hours 04/25/23 1404 04/27/23 2159   04/25/23 2100  vancomycin (VANCOCIN) IVPB 1000 mg/200 mL premix        1,000 mg 200 mL/hr over 60 Minutes Intravenous  Once 04/25/23 1404     04/25/23 0400  vancomycin (VANCOREADY) IVPB 1250 mg/250 mL        1,250 mg 166.7 mL/hr over 90 Minutes Intravenous To Surgery 04/24/23 1159 04/25/23 1015   04/25/23 0400  ceFAZolin (ANCEF) IVPB 2g/100 mL premix        2 g 200 mL/hr over 30 Minutes Intravenous To Surgery 04/24/23 1159 04/25/23 1259   04/25/23 0400  ceFAZolin (ANCEF) IVPB 2g/100 mL premix  Status:  Discontinued        2 g 200 mL/hr over 30 Minutes Intravenous To Surgery 04/24/23 1159 04/25/23 1404       Subjective: Patient  was seen and examined at bedside.  Overnight events noted. Patient is s/p CABG.  Remains intubated.  Objective: Vitals:   04/25/23 0754 04/25/23 0755 04/25/23 0756 04/25/23 1410  BP:      Pulse: 62 63 64 80  Resp: 16 15 15 16   Temp:    (!) 97 F (36.1 C)  TempSrc:      SpO2: 99% 99% 100% (!) 89%  Weight:      Height:        Intake/Output Summary (Last 24 hours) at 04/25/2023 1438 Last data filed at 04/25/2023  1344 Gross per 24 hour  Intake 3336.99 ml  Output 1740 ml  Net 1596.99 ml    Filed Weights   04/22/23 0500 04/22/23 1237 04/25/23 0423  Weight: 66.2 kg 65.4 kg 64.4 kg    Examination:  General exam: Appears calm and comfortable , intubated. Respiratory system: Clear to auscultation. Respiratory effort normal.  RR 12 Cardiovascular system: S1 & S2 heard, regular rate and rhythm, no murmur. Gastrointestinal system: Abdomen is soft, non tender, non distended, BS+ Central nervous system: Intubated and sedated, Extremities: Symmetric 5 x 5 power. Skin: No rashes, lesions or ulcers Psychiatry: Intubated and sedated.    Data Reviewed: I have personally reviewed following labs and imaging studies  CBC: Recent Labs  Lab 04/19/23 0741 04/20/23 0518 04/22/23 0841 04/22/23 1622 04/23/23 0602 04/24/23 0205 04/25/23 0854 04/25/23 1205 04/25/23 1220 04/25/23 1313 04/25/23 1316 04/25/23 1420  WBC 8.8 7.4 9.5  --  8.6 9.0  --   --   --   --   --   --   NEUTROABS 6.5  --   --   --   --   --   --   --   --   --   --   --   HGB 15.4 15.6 14.6   < > 15.7 15.6   < > 11.2* 11.2* 9.2* 10.5* 11.9*  HCT 47.9 46.6 42.9   < > 45.6 45.6   < > 33.4* 33.0* 27.0* 31.0* 35.0*  MCV 98.2 94.5 93.1  --  92.5 93.6  --   --   --   --   --   --   PLT 158 154 152  --  142* 146*  --  106*  --   --   --   --    < > = values in this interval not displayed.    Basic Metabolic Panel: Recent Labs  Lab 04/19/23 0741 04/20/23 0518 04/22/23 0841 04/22/23 1622 04/23/23 0602 04/24/23 0205 04/25/23 0854 04/25/23 0859 04/25/23 1029 04/25/23 1044 04/25/23 1118 04/25/23 1220 04/25/23 1313 04/25/23 1316 04/25/23 1420  NA 140 139 135   < > 136 138   < > 141 140   < > 140 141 141 140 141  K 3.9 3.5 3.7   < > 4.2 4.1   < > 4.1 4.2   < > 5.3* 5.4* 4.4 4.4 4.1  CL 107 104 103  --  105 108  --  108 107  --  106 107  --  107  --   CO2 23 25 22   --  20* 23  --   --   --   --   --   --   --   --   --    GLUCOSE 111* 97 109*  --  104* 106*  --  93 97  --  111* 134*  --  132*  --  BUN 19 16 19   --  17 28*  --  29* 27*  --  25* 25*  --  23*  --   CREATININE 1.08 1.03 1.04  --  0.97 1.05  --  0.90 0.90  --  0.80 0.80  --  0.70  --   CALCIUM 9.1 9.2 9.1  --  9.2 9.1  --   --   --   --   --   --   --   --   --   MG  --  2.0  --   --  2.1  --   --   --   --   --   --   --   --   --   --    < > = values in this interval not displayed.    GFR: Estimated Creatinine Clearance: 107.3 mL/min (by C-G formula based on SCr of 0.7 mg/dL). Liver Function Tests: Recent Labs  Lab 04/19/23 0741 04/20/23 0518 04/23/23 0602 04/24/23 0205  AST 28 20 15  14*  ALT 20 18 13 13   ALKPHOS 58 56 49 51  BILITOT 0.5 1.3* 1.1 0.9  PROT 7.5 7.0 6.8 6.6  ALBUMIN 4.4 4.1 3.7 3.5    Recent Labs  Lab 04/19/23 0741  LIPASE 35    No results for input(s): "AMMONIA" in the last 168 hours. Coagulation Profile: Recent Labs  Lab 04/24/23 0205  INR 1.1    Cardiac Enzymes: No results for input(s): "CKTOTAL", "CKMB", "CKMBINDEX", "TROPONINI" in the last 168 hours. BNP (last 3 results) No results for input(s): "PROBNP" in the last 8760 hours. HbA1C: No results for input(s): "HGBA1C" in the last 72 hours. CBG: No results for input(s): "GLUCAP" in the last 168 hours. Lipid Profile: No results for input(s): "CHOL", "HDL", "LDLCALC", "TRIG", "CHOLHDL", "LDLDIRECT" in the last 72 hours. Thyroid Function Tests: No results for input(s): "TSH", "T4TOTAL", "FREET4", "T3FREE", "THYROIDAB" in the last 72 hours. Anemia Panel: No results for input(s): "VITAMINB12", "FOLATE", "FERRITIN", "TIBC", "IRON", "RETICCTPCT" in the last 72 hours. Sepsis Labs: No results for input(s): "PROCALCITON", "LATICACIDVEN" in the last 168 hours.  Recent Results (from the past 240 hour(s))  MRSA Next Gen by PCR, Nasal     Status: None   Collection Time: 04/19/23  4:19 PM   Specimen: Nasal Mucosa; Nasal Swab  Result Value Ref Range  Status   MRSA by PCR Next Gen NOT DETECTED NOT DETECTED Final    Comment: (NOTE) The GeneXpert MRSA Assay (FDA approved for NASAL specimens only), is one component of a comprehensive MRSA colonization surveillance program. It is not intended to diagnose MRSA infection nor to guide or monitor treatment for MRSA infections. Test performance is not FDA approved in patients less than 47 years old. Performed at [facility], [address] [place], [place] [address]   SARS CORONAVIRUS 2 (TAT 6-24 HRS) Anterior Nasal Swab     Status: None   Collection Time: 04/23/23  9:12 PM   Specimen: Anterior Nasal Swab  Result Value Ref Range Status   SARS Coronavirus 2 NEGATIVE NEGATIVE Final    Comment: (NOTE) SARS-CoV-2 target nucleic acids are NOT DETECTED.  The SARS-CoV-2 RNA is generally detectable in upper and lower respiratory specimens during the acute phase of infection. Negative results do not preclude SARS-CoV-2 infection, do not rule out co-infections with other pathogens, and should not be used as the sole basis for treatment or other patient management decisions. Negative results must be combined  with clinical observations, patient history, and epidemiological information. The expected result is Negative.  Fact Sheet for Patients: HairSlick.no  Fact Sheet for Healthcare Providers: quierodirigir.com  This test is not yet approved or cleared by the [place] FDA and  has been authorized for detection and/or diagnosis of SARS-CoV-2 by FDA under an Emergency Use Authorization (EUA). This EUA will remain  in effect (meaning this test can be used) for the duration of the COVID-19 declaration under Se ction 564(b)(1) of the Act, 21 U.S.C. section 360bbb-3(b)(1), unless the authorization is terminated or revoked sooner.  Performed at [facility], [address] [place], [place] [address]   Surgical pcr screen     Status:  None   Collection Time: 04/23/23  9:12 PM   Specimen: Anterior Nasal Swab  Result Value Ref Range Status   MRSA, PCR NEGATIVE NEGATIVE Final   Staphylococcus aureus NEGATIVE NEGATIVE Final    Comment: (NOTE) The Xpert SA Assay (FDA approved for NASAL specimens in patients 45 years of age and older), is one component of a comprehensive surveillance program. It is not intended to diagnose infection nor to guide or monitor treatment. Performed at [facility], [address], [place], [place] [address]          Radiology Studies: EP STUDY  Result Date: 04/25/2023 See surgical note for result.   Scheduled Meds:  [START ON 04/26/2023] acetaminophen  1,000 mg Oral Q6H   Or   [START ON 04/26/2023] acetaminophen (TYLENOL) oral liquid 160 mg/5 mL  1,000 mg Per Tube Q6H   acetaminophen (TYLENOL) oral liquid 160 mg/5 mL  650 mg Per Tube Once   Or   acetaminophen  650 mg Rectal Once   [START ON 04/26/2023] aspirin EC  325 mg Oral Daily   Or   [START ON 04/26/2023] aspirin  324 mg Per Tube Daily   [START ON 04/26/2023] atorvastatin  40 mg Oral Daily   [START ON 04/26/2023] bisacodyl  10 mg Oral Daily   Or   [START ON 04/26/2023] bisacodyl  10 mg Rectal Daily   chlorhexidine  15 mL Mouth/Throat NOW   [START ON 04/26/2023] docusate sodium  200 mg Oral Daily   metoprolol tartrate  12.5 mg Oral BID   Or   metoprolol tartrate  12.5 mg Per Tube BID   [START ON 04/27/2023] pantoprazole  40 mg Oral Daily   [START ON 04/26/2023] sodium chloride flush  3 mL Intravenous Q12H   Continuous Infusions:  sodium chloride 10 mL/hr at 04/25/23 1425   [START ON 04/26/2023] sodium chloride     sodium chloride 10 mL/hr at 04/25/23 1425   albumin human      ceFAZolin (ANCEF) IV     dexmedetomidine (PRECEDEX) IV infusion 0.5 mcg/kg/hr (04/25/23 1400)   famotidine (PEPCID) IV 20 mg (04/25/23 1430)   insulin 1.1 Units/hr (04/25/23 1400)   lactated ringers     lactated ringers     lactated ringers 10  mL/hr at 04/25/23 1424   magnesium sulfate 4 g (04/25/23 1429)   nitroGLYCERIN     nitroGLYCERIN 5 mcg/min (04/25/23 1400)   phenylephrine (NEO-SYNEPHRINE) Adult infusion 20 mcg/min (04/25/23 1400)   potassium chloride     vancomycin       LOS: 6 days    Time spent: 35 mins    Willeen Niece, MD [facility]   If 7PM-7AM, please contact night-coverage

## 2023-04-25 NOTE — Procedures (Signed)
Extubation Procedure Note  Patient Details:   Name: Caleb Perkins DOB: 1976-03-04 MRN: [id number]   Airway Documentation:    Vent end date: 04/25/23 Vent end time: 1759   Evaluation  O2 sats: stable throughout Complications: No apparent complications Patient did tolerate procedure well. Bilateral Breath Sounds: Clear, Diminished   Yes  Patient extubated per rapid heart wean protocol. Positive cuff leak. No stridor noted. VC 1L, NIF -15. MD okay to proceed with extubation. Vitals stable on 2L [place]. RN at bedside.  Harmon Dun Annaleigh Steinmeyer 04/25/2023, 5:59 PM

## 2023-04-25 NOTE — Brief Op Note (Signed)
04/19/2023 - 04/25/2023  2:05 PM  PATIENT:  Caleb Perkins  47 y.o. male  PRE-OPERATIVE DIAGNOSIS:  CAD  POST-OPERATIVE DIAGNOSIS:  CAD  PROCEDURE:   CORONARY ARTERY BYPASS GRAFTING (CABG) X 5 USING LEFT INTERNAL MAMMARY ARTERY AND ENDOSCOPICALLY HARVESTED RIGHT GREATER SAPHENOUS VEIN (N/A) RADIAL ARTERY HARVEST (Left) -LIMA to LAD -SVG sequential to OM1 and OM2 -SVG to Diagonal -Left Radial artery to PDA TRANSESOPHAGEAL ECHOCARDIOGRAM (N/A) Vein harvest time: Vein prep time: Artery harvest time: Artery prep time:  SURGEON:  Surgeon(s) and Role:    * Loreli Slot, MD - Primary  PHYSICIAN ASSISTANT: Aloha Gell PA-C, Jillyn Hidden PA-C  ASSISTANTS: Tanda Rockers RNFA   ANESTHESIA:   general  EBL:  880 mL   BLOOD ADMINISTERED: Cell Saver  DRAINS:  Mediastinal and pleural tubes    LOCAL MEDICATIONS USED:  NONE  SPECIMEN:  No Specimen  DISPOSITION OF SPECIMEN:  N/A  COUNTS CORRECT:  ACTION TAKEN: X-RAY(S) TAKEN - no needle seen  DICTATION:- done  PLAN OF CARE: Admit to inpatient   PATIENT DISPOSITION:  ICU - intubated and hemodynamically stable.   Delay start of Pharmacological VTE agent (>24hrs) due to surgical blood loss or risk of bleeding: yes

## 2023-04-25 NOTE — Anesthesia Procedure Notes (Signed)
Central Venous Catheter Insertion Performed by: Lewie Loron, MD, anesthesiologist Start/End4/25/2024 7:40 AM, 04/25/2023 8:05 AM Patient location: Pre-op. Preanesthetic checklist: patient identified, IV checked, site marked, risks and benefits discussed, surgical consent, monitors and equipment checked, pre-op evaluation, timeout performed and anesthesia consent Position: Trendelenburg Lidocaine 1% used for infiltration and patient sedated Hand hygiene performed  and maximum sterile barriers used  Catheter size: 8.5 Fr Sheath introducer Procedure performed using ultrasound guided technique. Ultrasound Notes:anatomy identified, needle tip was noted to be adjacent to the nerve/plexus identified, no ultrasound evidence of intravascular and/or intraneural injection and image(s) printed for medical record Attempts: 1 Following insertion, line sutured, dressing applied and Biopatch. Post procedure assessment: blood return through all ports, free fluid flow and no air  Patient tolerated the procedure well with no immediate complications.

## 2023-04-25 NOTE — Anesthesia Postprocedure Evaluation (Signed)
Anesthesia Post Note  Patient: Caleb Perkins  Procedure(s) Performed: CORONARY ARTERY BYPASS GRAFTING (CABG) X 5 USING LEFT INTERNAL MAMMARY ARTERY AND ENDOSCOPICALLY HARVESTED RIGHT GREATER SAPHENOUS VEIN (Chest) RADIAL ARTERY HARVEST (Left) TRANSESOPHAGEAL ECHOCARDIOGRAM     Patient location during evaluation: SICU Anesthesia Type: General Level of consciousness: sedated and patient remains intubated per anesthesia plan Pain management: pain level controlled Vital Signs Assessment: post-procedure vital signs reviewed and stable Respiratory status: patient remains intubated per anesthesia plan Cardiovascular status: stable Anesthetic complications: no   No notable events documented.  Last Vitals:  Vitals:   04/25/23 1700 04/25/23 1722  BP: 102/78   Pulse: 80   Resp: 17   Temp: 36.8 C   SpO2: 98% 96%    Last Pain:  Vitals:   04/25/23 1600  TempSrc: Core  PainSc:                  Lewie Loron

## 2023-04-25 NOTE — Anesthesia Procedure Notes (Signed)
Central Venous Catheter Insertion Performed by: Lewie Loron, MD, anesthesiologist Start/End4/25/2024 8:05 AM, 04/25/2023 8:10 AM Patient location: Pre-op. Preanesthetic checklist: patient identified, IV checked, site marked, risks and benefits discussed, surgical consent, monitors and equipment checked, pre-op evaluation, timeout performed and anesthesia consent Hand hygiene performed  and maximum sterile barriers used  PA cath was placed.Swan type:thermodilution PA Cath depth:47 Procedure performed without using ultrasound guided technique. Attempts: 1 Post procedure assessment: free fluid flow and no air  Patient tolerated the procedure well with no immediate complications.

## 2023-04-25 NOTE — Discharge Instructions (Signed)

## 2023-04-25 NOTE — Progress Notes (Signed)
      [address]       [place] [address]             [phone]      S/p CABG x 5  Weaning in progress  BP 102/78   Pulse 80   Temp 98.2 F (36.8 C)   Resp 17   Ht  (1.778 m)   Wt 64.4 kg   SpO2 96%   BMI 20.37 kg/m   31/11 CI 2.1 NTG at 10   Intake/Output Summary (Last 24 hours) at 04/25/2023 1754 Last data filed at 04/25/2023 1700 Gross per 24 hour  Intake 3695.03 ml  Output 2275 ml  Net 1420.03 ml   Minimal CT output  Doing well early postop  Viviann Spare C. Dorris Fetch, MD [facility] [phone]

## 2023-04-25 NOTE — Anesthesia Procedure Notes (Signed)
Procedure Name: Intubation Date/Time: 04/25/2023 8:42 AM  Performed by: Jodell Cipro, CRNAPre-anesthesia Checklist: Patient identified, Emergency Drugs available, Suction available and Patient being monitored Patient Re-evaluated:Patient Re-evaluated prior to induction Oxygen Delivery Method: Circle System Utilized Preoxygenation: Pre-oxygenation with 100% oxygen Induction Type: IV induction Ventilation: Mask ventilation without difficulty Laryngoscope Size: Mac and 4 Grade View: Grade I Tube type: Oral Tube size: 8.0 mm Number of attempts: 1 Airway Equipment and Method: Stylet and Oral airway Placement Confirmation: ETT inserted through vocal cords under direct vision, positive ETCO2 and breath sounds checked- equal and bilateral Secured at: 22 cm Tube secured with: Tape Dental Injury: Teeth and Oropharynx as per pre-operative assessment

## 2023-04-25 NOTE — Interval H&P Note (Signed)
History and Physical Interval Note:  04/25/2023 7:49 AM  [name]  has presented today for surgery, with the diagnosis of CAD.  The various methods of treatment have been discussed with the patient and family. After consideration of risks, benefits and other options for treatment, the patient has consented to  Procedure(s): CORONARY ARTERY BYPASS GRAFTING (CABG) (N/A) RADIAL ARTERY HARVEST (Left) TRANSESOPHAGEAL ECHOCARDIOGRAM (N/A) as a surgical intervention.  The patient's history has been reviewed, patient examined, no change in status, stable for surgery.  I have reviewed the patient's chart and labs.  Questions were answered to the patient's satisfaction.     Caleb Perkins

## 2023-04-25 NOTE — Transfer of Care (Signed)
Immediate Anesthesia Transfer of Care Note  Patient: Caleb Perkins  Procedure(s) Performed: CORONARY ARTERY BYPASS GRAFTING (CABG) X 5 USING LEFT INTERNAL MAMMARY ARTERY AND ENDOSCOPICALLY HARVESTED RIGHT GREATER SAPHENOUS VEIN (Chest) RADIAL ARTERY HARVEST (Left) TRANSESOPHAGEAL ECHOCARDIOGRAM  Patient Location: ICU  Anesthesia Type:General  Level of Consciousness: Patient remains intubated per anesthesia plan  Airway & Oxygen Therapy: Patient remains intubated per anesthesia plan and Patient placed on Ventilator (see vital sign flow sheet for setting)  Post-op Assessment: Report given to RN and Post -op Vital signs reviewed and stable  Post vital signs: Reviewed and stable  Last Vitals:  Vitals Value Taken Time  BP    Temp 36.1 C 04/25/23 1415  Pulse 80 04/25/23 1415  Resp 16 04/25/23 1415  SpO2 94 % 04/25/23 1415  Vitals shown include unvalidated device data.  Last Pain:  Vitals:   04/25/23 0338  TempSrc: Oral  PainSc:          Complications: No notable events documented.

## 2023-04-25 NOTE — Anesthesia Procedure Notes (Signed)
Arterial Line Insertion Start/End4/25/2024 7:50 AM, 04/25/2023 8:00 AM Performed by: Jodell Cipro, CRNA, CRNA  Patient location: Pre-op. Preanesthetic checklist: patient identified, IV checked, site marked, risks and benefits discussed, surgical consent, monitors and equipment checked, pre-op evaluation, timeout performed and anesthesia consent Lidocaine 1% used for infiltration and patient sedated Right, radial was placed Hand hygiene performed  and maximum sterile barriers used  Allen's test indicative of satisfactory collateral circulation Attempts: 1 Procedure performed without using ultrasound guided technique. Following insertion, dressing applied and Biopatch. Patient tolerated the procedure well with no immediate complications.

## 2023-04-26 ENCOUNTER — Inpatient Hospital Stay (HOSPITAL_COMMUNITY): Payer: Medicaid Other

## 2023-04-26 ENCOUNTER — Encounter (HOSPITAL_COMMUNITY): Payer: Self-pay | Admitting: Thoracic Surgery (Cardiothoracic Vascular Surgery)

## 2023-04-26 DIAGNOSIS — I161 Hypertensive emergency: Secondary | ICD-10-CM | POA: Diagnosis not present

## 2023-04-26 DIAGNOSIS — I255 Ischemic cardiomyopathy: Secondary | ICD-10-CM | POA: Diagnosis not present

## 2023-04-26 DIAGNOSIS — I214 Non-ST elevation (NSTEMI) myocardial infarction: Secondary | ICD-10-CM | POA: Diagnosis not present

## 2023-04-26 LAB — CBC
HCT: 34.5 % — ABNORMAL LOW (ref 39.0–52.0)
HCT: 35.2 % — ABNORMAL LOW (ref 39.0–52.0)
Hemoglobin: 11.3 g/dL — ABNORMAL LOW (ref 13.0–17.0)
Hemoglobin: 11.6 g/dL — ABNORMAL LOW (ref 13.0–17.0)
MCH: 31.6 pg (ref 26.0–34.0)
MCH: 31.4 pg (ref 26.0–34.0)
MCHC: 32.8 g/dL (ref 30.0–36.0)
MCHC: 33 g/dL (ref 30.0–36.0)
MCV: 96.4 fL (ref 80.0–100.0)
MCV: 95.1 fL (ref 80.0–100.0)
Platelets: 97 10*3/uL — ABNORMAL LOW (ref 150–400)
Platelets: 124 10*3/uL — ABNORMAL LOW (ref 150–400)
RBC: 3.58 MIL/uL — ABNORMAL LOW (ref 4.22–5.81)
RBC: 3.7 MIL/uL — ABNORMAL LOW (ref 4.22–5.81)
RDW: 12.5 % (ref 11.5–15.5)
RDW: 12.3 % (ref 11.5–15.5)
WBC: 9.8 10*3/uL (ref 4.0–10.5)
WBC: 14.4 10*3/uL — ABNORMAL HIGH (ref 4.0–10.5)
nRBC: 0 % (ref 0.0–0.2)
nRBC: 0 % (ref 0.0–0.2)

## 2023-04-26 LAB — GLUCOSE, CAPILLARY
Glucose-Capillary: 117 mg/dL — ABNORMAL HIGH (ref 70–99)
Glucose-Capillary: 120 mg/dL — ABNORMAL HIGH (ref 70–99)
Glucose-Capillary: 122 mg/dL — ABNORMAL HIGH (ref 70–99)
Glucose-Capillary: 122 mg/dL — ABNORMAL HIGH (ref 70–99)
Glucose-Capillary: 126 mg/dL — ABNORMAL HIGH (ref 70–99)
Glucose-Capillary: 130 mg/dL — ABNORMAL HIGH (ref 70–99)
Glucose-Capillary: 131 mg/dL — ABNORMAL HIGH (ref 70–99)
Glucose-Capillary: 136 mg/dL — ABNORMAL HIGH (ref 70–99)
Glucose-Capillary: 138 mg/dL — ABNORMAL HIGH (ref 70–99)
Glucose-Capillary: 140 mg/dL — ABNORMAL HIGH (ref 70–99)
Glucose-Capillary: 154 mg/dL — ABNORMAL HIGH (ref 70–99)

## 2023-04-26 LAB — BASIC METABOLIC PANEL
Anion gap: 8 (ref 5–15)
Anion gap: 8 (ref 5–15)
BUN: 17 mg/dL (ref 6–20)
BUN: 15 mg/dL (ref 6–20)
CO2: 22 mmol/L (ref 22–32)
CO2: 26 mmol/L (ref 22–32)
Calcium: 8 mg/dL — ABNORMAL LOW (ref 8.9–10.3)
Calcium: 8.6 mg/dL — ABNORMAL LOW (ref 8.9–10.3)
Chloride: 104 mmol/L (ref 98–111)
Chloride: 100 mmol/L (ref 98–111)
Creatinine, Ser: 0.8 mg/dL (ref 0.61–1.24)
Creatinine, Ser: 0.86 mg/dL (ref 0.61–1.24)
GFR, Estimated: >60 mL/min (ref >60)
GFR, Estimated: >60 mL/min (ref >60)
Glucose, Bld: 113 mg/dL — ABNORMAL HIGH (ref 70–99)
Glucose, Bld: 151 mg/dL — ABNORMAL HIGH (ref 70–99)
Potassium: 4.8 mmol/L (ref 3.5–5.1)
Potassium: 3.9 mmol/L (ref 3.5–5.1)
Sodium: 134 mmol/L — ABNORMAL LOW (ref 135–145)
Sodium: 134 mmol/L — ABNORMAL LOW (ref 135–145)

## 2023-04-26 LAB — MAGNESIUM
Magnesium: 2.5 mg/dL — ABNORMAL HIGH (ref 1.7–2.4)
Magnesium: 2.3 mg/dL (ref 1.7–2.4)

## 2023-04-26 MED ORDER — ISOSORBIDE MONONITRATE ER 30 MG PO TB24
15.0000 mg | ORAL_TABLET | Freq: Every day | ORAL | Status: DC
  Administered 2023-04-26: 15 mg via ORAL
  Filled 2023-04-26: qty 1

## 2023-04-26 MED ORDER — HYDRALAZINE HCL 20 MG/ML IJ SOLN
10.0000 mg | INTRAMUSCULAR | Status: DC | PRN
  Administered 2023-04-26 (×2): 10 mg via INTRAVENOUS
  Filled 2023-04-26 (×3): qty 1

## 2023-04-26 MED ORDER — METOPROLOL TARTRATE 12.5 MG HALF TABLET
12.5000 mg | ORAL_TABLET | Freq: Once | ORAL | Status: AC
  Administered 2023-04-26: 12.5 mg via ORAL
  Filled 2023-04-26: qty 1

## 2023-04-26 MED ORDER — INSULIN ASPART 100 UNIT/ML IJ SOLN
0.0000 [IU] | INTRAMUSCULAR | Status: DC
  Administered 2023-04-26 – 2023-04-27 (×5): 2 [IU] via SUBCUTANEOUS

## 2023-04-26 MED ORDER — AMLODIPINE BESYLATE 10 MG PO TABS
10.0000 mg | ORAL_TABLET | Freq: Every day | ORAL | Status: DC
  Administered 2023-04-27 – 2023-04-28 (×2): 10 mg via ORAL
  Filled 2023-04-26 (×2): qty 1

## 2023-04-26 MED ORDER — ISOSORBIDE MONONITRATE ER 30 MG PO TB24
30.0000 mg | ORAL_TABLET | Freq: Every day | ORAL | Status: DC
  Administered 2023-04-27 – 2023-05-01 (×5): 30 mg via ORAL
  Filled 2023-04-26 (×5): qty 1

## 2023-04-26 MED ORDER — METOPROLOL TARTRATE 25 MG/10 ML ORAL SUSPENSION: 25.0000 mg | Freq: Two times a day (BID) | ORAL | Status: DC

## 2023-04-26 MED ORDER — AMLODIPINE BESYLATE 5 MG PO TABS
5.0000 mg | ORAL_TABLET | Freq: Every day | ORAL | Status: DC
  Administered 2023-04-26: 5 mg via ORAL
  Filled 2023-04-26: qty 1

## 2023-04-26 MED ORDER — INSULIN DETEMIR 100 UNIT/ML ~~LOC~~ SOLN
10.0000 [IU] | Freq: Once | SUBCUTANEOUS | Status: AC
  Administered 2023-04-26: 10 [IU] via SUBCUTANEOUS
  Filled 2023-04-26: qty 0.1

## 2023-04-26 MED ORDER — FUROSEMIDE 10 MG/ML IJ SOLN
20.0000 mg | Freq: Once | INTRAMUSCULAR | Status: AC
  Administered 2023-04-26: 20 mg via INTRAVENOUS
  Filled 2023-04-26: qty 2

## 2023-04-26 MED ORDER — ENOXAPARIN SODIUM 40 MG/0.4ML IJ SOSY
40.0000 mg | PREFILLED_SYRINGE | Freq: Every day | INTRAMUSCULAR | Status: DC
  Administered 2023-04-26 – 2023-04-29 (×4): 40 mg via SUBCUTANEOUS
  Filled 2023-04-26 (×4): qty 0.4

## 2023-04-26 MED ORDER — METOPROLOL TARTRATE 25 MG PO TABS
25.0000 mg | ORAL_TABLET | Freq: Two times a day (BID) | ORAL | Status: DC
  Administered 2023-04-26: 25 mg via ORAL
  Filled 2023-04-26: qty 1

## 2023-04-26 NOTE — Discharge Summary (Addendum)
[address]       [place] [address]             [phone]    Physician Discharge Summary  Patient ID: Caleb Perkins MRN: [id number] DOB/AGE: 45-Oct-1977 45 y.o.  Admit date: 04/19/2023 Discharge date: 05/01/2023  Admission Diagnoses:  Patient Active Problem List   Diagnosis Date Noted   Malnutrition of moderate degree (HCC) 04/23/2023   Cardiomyopathy due to hypertension, with heart failure (HCC) 04/22/2023   Pulmonary HTN (HCC) 04/22/2023   Coronary artery calcification 04/22/2023   Hypertensive emergency 04/19/2023   Acute exacerbation of chronic obstructive pulmonary disease (COPD) (HCC) 11/10/2019   HTN (hypertension) 11/10/2019   Tobacco abuse 11/10/2019   Acute bronchitis 11/09/2019     Discharge Diagnoses:  Patient Active Problem List   Diagnosis Date Noted   S/P CABG x 5 04/25/2023   Malnutrition of moderate degree (HCC) 04/23/2023   Cardiomyopathy due to hypertension, with heart failure (HCC) 04/22/2023   Pulmonary HTN (HCC) 04/22/2023   Coronary artery calcification 04/22/2023   Hypertensive emergency 04/19/2023   Acute exacerbation of chronic obstructive pulmonary disease (COPD) (HCC) 11/10/2019   HTN (hypertension) 11/10/2019   Tobacco abuse 11/10/2019   Acute bronchitis 11/09/2019   Discharged Condition: stable  History of Present Illness:     This is a 47 year old male with a past medical history of hypertension, tobacco abuse, marijuana abuse who presented to [facility] ED with worsening shortness of breath and chest pain 04/19/2023. He denies syncope, diaphoresis, but has had intermittent ankle edema. Of note, he has been non compliant with medications for over one year. He was found to have a hypertensive emergency (BP 228/156), Troponin I (high sensitivity) max 102, and BNP 495. He was given IV Labetalol and cardiology was consulted.  CTA showed no evidence of aortic dissection but was noted to have mild dilation of  the ascending aorta measuring 4.1 cm and Interlobular septal thickening in both lungs with a small right pleural effusion and patchy ground-glass opacities in the right upper and lower lobes. Echo done 04/19/2023 showed LVEF 35-40%, LV with global hypokinesis, mild MR, mild AI, mild TR, and aortic dilatation 40 mm. Cardiac catheterization done 04/22/2023 showed ostial LAD to proximal LAD lesion is 95% stenosed, mid LAD with 85% stenosis, mid to distal Circumflex with a 75% stenosis, and proximal to distal RCA with 100% stenosis. Cardiothoracic consultation was requested for consideration of coronary artery disease.  Patient works at [employer] and eats there daily (has a high salt diet.) Per patient, he was previously able to walk for several miles a day but now gets short of breath with walking minimal distance. At the time of my exam, he denies chest pain.  Dr. Leafy Ro and Dr. Dorris Fetch reviewed the patient's diagnostic studies and determined the patient would benefit from surgical intervention. The treatment options as well as the risks and benefits of surgery were discussed with the patient. Mr. Dougher was agreeable to proceed with surgery.  Hospital Course: Mr. Botz was admitted to [facility] on 04/20 and remained in stable condition until he was brought to the operating room on 04/25/23. He underwent CABG x 5 utilizing LIMA to LAD, SVG sequential to OM1 and OM2, SVG to Diagonal, and Radial Artery to PDA. He tolerated the procedure well and was transferred to the SICU in stable condition. He was extubated the evening of surgery without complication. Drips were weaned as hemodynamics  tolerated. Swan ganz catheter and chest tubes were removed without complication. He was started on Norvasc and Imdur for his radial artery conduit.  He was felt stable for transfer to 4E Progressive Care on post-op day 2 but remained in the ICU due to bed unavailability.  He developed atrial fibrillation on  post-op day 3 while still in the ICU and was started on oral amiodarone loading. He was followed by Dr. Antoine Poche for his systolic heart failure and was started on Entresto and Farxiga. He remained in NSR with bursts of NSVT and atrial fibrillation with RVR. He was hypertensive, Coreg was titrated. His epicardial pacing wires were removed without complication. He had another burst of atrial fibrillation with RVR, he converted to NSR with a bolus of Amiodarone, oral Amiodarone was continued. He was started on Eliquis for stroke prevention. He reported that he does not have a PCP, the transition of care team was consulted for assistance and PCP was arranged. His heart rate and rhythm remained stable. He has a history of COPD, he developed a minimally productive cough and was given Mucinex to assist with sputum production. Incentive spirometry, ambulation and home inhalers were also encouraged. He continued to saturate well on room air. He was ambulating well and his incisions were healing well without sign of infection. He was felt stable for discharge home.  Consults: cardiology  Significant Diagnostic Studies:  RIGHT/LEFT HEART CATH AND CORONARY ANGIOGRAPHY   Conclusion  Conclusions: Severe three-vessel coronary artery disease, as detailed below, including sequential 95% ostial and 80-90% mid LAD stenoses, 60% OM1 lesion, 70-80% mid/distal LCx disease, and chronic total occlusion of proximal/mid RCA with left-to-right and right-to-right collaterals. Moderately reduced left ventricular systolic function (LVEF 35-45%). Normal left heart, right heart, and pulmonary artery pressures. Normal Fick cardiac output/index. ECHOCARDIOGRAM REPORT       Patient Name:   ION GONNELLA Date of Exam: 04/19/2023  Medical Rec #:  [id number]       Height:       70.0 in  Accession #:    [id number]      Weight:       175.0 lb  Date of Birth:  1976-04-23        BSA:          1.972 m  Patient Age:    46 years        BP:            202/145 mmHg  Patient Gender: M               HR:           73 bpm.  [facility]   Procedure: 2D Echo, Cardiac Doppler and Color Doppler   Indications:    Chest pain    History:        Patient has no prior history of Echocardiogram  examinations.                 Risk Factors:Hypertension.    Sonographer:    Milda Smart  Referring Phys: [id number] Lennart Pall STRADER   IMPRESSIONS     1. Left ventricular ejection fraction, by estimation, is 35 to 40%. The  left ventricle has moderately decreased function. The left ventricle  demonstrates global hypokinesis. There is moderate left ventricular  hypertrophy. Left ventricular diastolic  parameters are consistent with Grade II diastolic dysfunction  (pseudonormalization). Elevated left atrial pressure.   2. Right ventricular systolic function is normal. The  right ventricular  size is normal. There is severely elevated pulmonary artery systolic  pressure.   3. Left atrial size was mildly dilated.   4. Right atrial size was mildly dilated.   5. The mitral valve is abnormal. Mild mitral valve regurgitation. No  evidence of mitral stenosis.   6. The tricuspid valve is abnormal.   7. The aortic valve is tricuspid. Aortic valve regurgitation is mild. No  aortic stenosis is present.   8. Aortic dilatation noted. There is mild dilatation of the ascending  aorta, measuring 40 mm.   9. The inferior vena cava is dilated in size with <50% respiratory  variability, suggesting right atrial pressure of 15 mmHg.   FINDINGS   Left Ventricle: Left ventricular ejection fraction, by estimation, is 35  to 40%. The left ventricle has moderately decreased function. The left  ventricle demonstrates global hypokinesis. The left ventricular internal  cavity size was normal in size.  There is moderate left ventricular hypertrophy. Left ventricular diastolic  parameters are consistent with Grade II diastolic dysfunction   (pseudonormalization). Elevated left atrial pressure.   Right Ventricle: The right ventricular size is normal. Right vetricular  wall thickness was not well visualized. Right ventricular systolic  function is normal. There is severely elevated pulmonary artery systolic  pressure. The tricuspid regurgitant  velocity is 3.72 m/s, and with an assumed right atrial pressure of 15  mmHg, the estimated right ventricular systolic pressure is 70.4 mmHg.   Left Atrium: Left atrial size was mildly dilated.   Right Atrium: Right atrial size was mildly dilated.   Pericardium: There is no evidence of pericardial effusion.   Mitral Valve: The mitral valve is abnormal. Mild mitral valve  regurgitation. No evidence of mitral valve stenosis.   Tricuspid Valve: The tricuspid valve is abnormal. Tricuspid valve  regurgitation is mild . No evidence of tricuspid stenosis.   Aortic Valve: The aortic valve is tricuspid. Aortic valve regurgitation is  mild. No aortic stenosis is present. Aortic valve mean gradient measures  1.4 mmHg. Aortic valve peak gradient measures 3.1 mmHg. Aortic valve area,  by VTI measures 5.54 cm.   Pulmonic Valve: The pulmonic valve was not well visualized. Pulmonic valve  regurgitation is not visualized. No evidence of pulmonic stenosis.   Aorta: The aortic root is normal in size and structure and aortic  dilatation noted. There is mild dilatation of the ascending aorta,  measuring 40 mm.   Venous: The inferior vena cava is dilated in size with less than 50%  respiratory variability, suggesting right atrial pressure of 15 mmHg.   IAS/Shunts: No atrial level shunt detected by color flow Doppler.     LEFT VENTRICLE  PLAX 2D  LVIDd:         5.10 cm      Diastology  LVIDs:         4.50 cm      LV e' medial:    4.95 cm/s  LV PW:         1.30 cm      LV E/e' medial:  16.4  LV IVS:        1.30 cm      LV e' lateral:   5.98 cm/s  LVOT diam:     2.40 cm      LV E/e' lateral:  13.6  LV SV:         71  LV SV Index:   36  LVOT Area:     4.52  cm    LV Volumes (MOD)  LV vol d, MOD A2C: 147.0 ml  LV vol d, MOD A4C: 128.0 ml  LV vol s, MOD A2C: 80.8 ml  LV vol s, MOD A4C: 67.0 ml  LV SV MOD A2C:     66.2 ml  LV SV MOD A4C:     128.0 ml  LV SV MOD BP:      61.7 ml   RIGHT VENTRICLE            IVC  RV S prime:     8.36 cm/s  IVC diam: 2.30 cm  TAPSE (M-mode): 1.6 cm   LEFT ATRIUM             Index        RIGHT ATRIUM           Index  LA diam:        4.40 cm 2.23 cm/m   RA Area:     24.10 cm  LA Vol (A2C):   77.5 ml 39.30 ml/m  RA Volume:   80.50 ml  40.82 ml/m  LA Vol (A4C):   71.7 ml 36.36 ml/m  LA Biplane Vol: 76.9 ml 38.99 ml/m   AORTIC VALVE  AV Area (Vmax):    4.04 cm  AV Area (Vmean):   4.64 cm  AV Area (VTI):     5.54 cm  AV Vmax:           88.23 cm/s  AV Vmean:          54.788 cm/s  AV VTI:            0.127 m  AV Peak Grad:      3.1 mmHg  AV Mean Grad:      1.4 mmHg  LVOT Vmax:         78.80 cm/s  LVOT Vmean:        56.200 cm/s  LVOT VTI:          0.156 m  LVOT/AV VTI ratio: 1.23    AORTA  Ao Root diam: 3.90 cm  Ao Asc diam:  4.00 cm   MITRAL VALVE               TRICUSPID VALVE  MV Area (PHT): 4.31 cm    TR Peak grad:   55.4 mmHg  MV Decel Time: 176 msec    TR Mean grad:   36.0 mmHg  MV E velocity: 81.40 cm/s  TR Vmax:        372.00 cm/s                             TR Vmean:       285.0 cm/s                               SHUNTS                             Systemic VTI:  0.16 m                             Systemic Diam: 2.40 cm   Dina Rich MD  Electronically signed by Dina Rich MD  Signature Date/Time: 04/19/2023/2:03:34 PM      Final     Treatments: surgery:  Operative Report    DATE OF PROCEDURE: 04/25/2023   PREOPERATIVE DIAGNOSIS:  Severe 3-vessel coronary artery disease with moderate left ventricular dysfunction.   POSTOPERATIVE DIAGNOSIS:  Severe 3-vessel coronary artery disease with moderate left  ventricular dysfunction.   PROCEDURES PERFORMED:  Median sternotomy, extracorporeal circulation, coronary artery bypass grafting x 5 (left internal mammary artery to LAD, left radial artery to posterior descending, saphenous vein graft to first diagonal, sequential saphenous vein  graft to obtuse marginals 1 and 2, open left radial harvest and endoscopic vein harvest from right thigh.   Discharge Exam: Blood pressure 127/80, pulse 84, temperature 98.9 F (37.2 C), temperature source Oral, resp. rate 18, height 5\' 10"  (1.778 m), weight 61.7 kg, SpO2 100 %. General appearance: alert, cooperative, and no distress Neurologic: intact Heart: Regular rate and rhythm, no murmur Lungs: Slightly diminished bibasilar Abdomen: soft, non-tender; bowel sounds normal; no masses,  no organomegaly Extremities: extremities normal, atraumatic, no cyanosis or edema Wound: Clean and dry, no erythema or sign of infection. Neurovascularly intact  Discharge Medications:  The patient has been discharged on:   1.Beta Blocker:  Yes [  X ]                              No   [   ]                              If No, reason:  2.Ace Inhibitor/ARB: Yes [  X ]                                     No  [    ]                                     If No, reason:  3.Statin:   Yes [  X ]                  No  [   ]                  If No, reason:  4.Ecasa:  Yes  [  X ]                  No   [   ]                  If No, reason:  Patient had ACS upon admission: Yes  Plavix/P2Y12 inhibitor: Yes [   ]                                      No  [  X ] On Eliquis for PAF     Discharge Instructions     Amb Referral to Cardiac Rehabilitation   Complete by: As directed    Diagnosis: CABG   CABG X ___: 5   After initial evaluation and assessments completed: Virtual Based Care may be provided alone or in conjunction with Phase 2 Cardiac Rehab based on patient barriers.: Yes   Intensive Cardiac Rehabilitation (ICR) [facility]  location only OR Traditional Cardiac Rehabilitation (TCR) *If criteria for  ICR are not met will enroll in TCR [facility] only): Yes      Allergies as of 05/01/2023   No Known Allergies      Medication List     STOP taking these medications    amLODipine 10 MG tablet Commonly known as: NORVASC   doxycycline 100 MG tablet Commonly known as: VIBRA-TABS   predniSONE 20 MG tablet Commonly known as: Deltasone   spironolactone-hydrochlorothiazide 25-25 MG tablet Commonly known as: ALDACTAZIDE       TAKE these medications    albuterol 108 (90 Base) MCG/ACT inhaler Commonly known as: VENTOLIN HFA Inhale 2 puffs into the lungs every 6 (six) hours as needed for wheezing or shortness of breath.   amiodarone 200 MG tablet Commonly known as: PACERONE Take 2 tablets once per day for 14 days then take 1 tablet once per day thereafter   apixaban 5 MG Tabs tablet Commonly known as: ELIQUIS Take 1 tablet (5 mg total) by mouth 2 (two) times daily.   aspirin EC 81 MG tablet Take 1 tablet (81 mg total) by mouth daily. Swallow whole.   atorvastatin 40 MG tablet Commonly known as: LIPITOR Take 1 tablet (40 mg total) by mouth daily.   carvedilol 25 MG tablet Commonly known as: COREG Take 1 tablet (25 mg total) by mouth 2 (two) times daily with a meal.   dapagliflozin propanediol 10 MG Tabs tablet Commonly known as: FARXIGA Take 1 tablet (10 mg total) by mouth daily.   dextromethorphan-guaiFENesin 30-600 MG 12hr tablet Commonly known as: MUCINEX DM Take 1 tablet by mouth 2 (two) times daily as needed for cough.   Fluticasone-Salmeterol 250-50 MCG/DOSE Aepb Commonly known as: Advair Diskus Inhale 1 puff into the lungs 2 (two) times daily.   furosemide 40 MG tablet Commonly known as: LASIX Take 1 tablet (40 mg total) by mouth daily as needed for edema (Weight gain of 3lbs or more in 24 hours or 5lbs or more in 48 hours).   isosorbide mononitrate 30 MG 24 hr tablet Commonly known  as: IMDUR Take 1 tablet (30 mg total) by mouth daily.   oxyCODONE 5 MG immediate release tablet Commonly known as: Oxy IR/ROXICODONE Take 1 tablet (5 mg total) by mouth every 6 (six) hours as needed for severe pain.   potassium chloride SA 20 MEQ tablet Commonly known as: KLOR-CON M Take 1 tablet (20 mEq total) by mouth daily as needed (When you take Lasix).   sacubitril-valsartan 49-51 MG Commonly known as: ENTRESTO Take 1 tablet by mouth 2 (two) times daily.   spironolactone 25 MG tablet Commonly known as: ALDACTONE Take 1 tablet (25 mg total) by mouth daily.        Follow-up Information     Loreli Slot, MD Follow up on 05/28/2023.   Specialty: Cardiothoracic Surgery Why: Follow up appointment is at 11:45AM Contact information: [address] [employer] [address] [place] [address] [phone]         [facility] Follow up on 05/28/2023.   Why: To get chest xray at 10:45AM Contact information: [address] [place] [address]        Sharlene Dory, NP Follow up on 05/14/2023.   Specialty: Cardiology Why: at 10am for your cardiology follow up appointment Contact information: [address] [place] [address] [phone]         [facility]. Go in 15 day(s).   Specialty: Cardiology Why:  Hospital follow up 05/15/2023 @ 2 pm  PLEASE bring a current medication list to appoinment FREE valet parking, Entrance C, off [address] information: [address] [id number] mc [place] [address] [phone]        [facility], [facility] .   Why: PCP needs: Medicaid is actually to this office. Case Manager unable to schedule an appointment-office to mail out new patient application forms. Once filled out the patient can be seen in the office. Contact information: [address] [address] [phone]                  Signed:  Jenny Reichmann, PA-C  05/01/2023, 11:04 AM

## 2023-04-26 NOTE — Progress Notes (Addendum)
1 Day Post-Op Procedure(s) (LRB): CORONARY ARTERY BYPASS GRAFTING (CABG) X 5 USING LEFT INTERNAL MAMMARY ARTERY AND ENDOSCOPICALLY HARVESTED RIGHT GREATER SAPHENOUS VEIN (N/A) RADIAL ARTERY HARVEST (Left) TRANSESOPHAGEAL ECHOCARDIOGRAM (N/A) Subjective: Doing well, extubated yesterday, able to stand for weights today  Objective: Vital signs in last 24 hours: Temp:  [96.8 F (36 C)-98.2 F (36.8 C)] 97.7 F (36.5 C) (04/26 0645) Pulse Rate:  [62-89] 88 (04/26 0645) Cardiac Rhythm: Atrial paced (04/26 0000) Resp:  [0-28] 17 (04/26 0645) BP: (91-125)/(72-101) 113/81 (04/26 0600) SpO2:  [89 %-100 %] 94 % (04/26 0645) Arterial Line BP: (105-157)/(22-90) 133/71 (04/26 0645) FiO2 (%):  [40 %-70 %] 40 % (04/25 1722) Weight:  [70.7 kg] 70.7 kg (04/26 0500)  Hemodynamic parameters for last 24 hours: PAP: (23-39)/(10-28) 25/12 CO:  [2.8 L/min-4.2 L/min] 4 L/min CI:  [1.5 L/min/m2-2.3 L/min/m2] 2.2 L/min/m2  Intake/Output from previous day: 04/25 0701 - 04/26 0700 In: 4569.6 [I.V.:3215.1; Blood:440; IV Piggyback:914.5] Out: 3555 [Urine:2265; Blood:880; Chest Tube:410] Intake/Output this shift: No intake/output data recorded.  General appearance: alert, cooperative, and no distress Neurologic: intact Heart: regular rate and rhythm Lungs: clear to auscultation bilaterally  Lab Results: Recent Labs    04/25/23 2007 04/26/23 0410  WBC 11.5* 9.8  HGB 12.8* 11.3*  HCT 38.8* 34.5*  PLT 109* 97*    BMET:  Recent Labs    04/25/23 2127 04/26/23 0410  NA 136 134*  K 5.4* 4.8  CL 108 104  CO2 21* 22  GLUCOSE 158* 113*  BUN 20 17  CREATININE 0.88 0.80  CALCIUM 7.8* 8.0*     PT/INR:  Recent Labs    04/25/23 1415  LABPROT 18.0*  INR 1.5*    ABG    Component Value Date/Time   PHART 7.280 (L) 04/25/2023 1859   HCO3 20.0 04/25/2023 1859   TCO2 21 (L) 04/25/2023 1859   ACIDBASEDEF 7.0 (H) 04/25/2023 1859   O2SAT 97 04/25/2023 1859   CBG (last 3)  Recent Labs     04/26/23 0158 04/26/23 0407 04/26/23 0622  GLUCAP 117* 120* 122*    Assessment/Plan: S/P Procedure(s) (LRB): CORONARY ARTERY BYPASS GRAFTING (CABG) X 5 USING LEFT INTERNAL MAMMARY ARTERY AND ENDOSCOPICALLY HARVESTED RIGHT GREATER SAPHENOUS VEIN (N/A) RADIAL ARTERY HARVEST (Left) TRANSESOPHAGEAL ECHOCARDIOGRAM (N/A)  5 vessel cabg doing well postoperatively  Clears for breakfast, regular diet PM if tolerates well without nausea D/c chest tubes tubes today Remove arterial line and [place] in unit today, likely out tomorrow to floor Ambulate TID    LOS: 7 days    Kerin Salen MD [facility] 04/26/2023 Patient seen and examined, agree with above Looks great, LUE well perfused, neuro intact Imdur + Norvasc for radial Thrombocytopenia- mild, monitor SCD + enoxaparin for DVT prophylaxis  Viviann Spare C. Dorris Fetch, MD [facility] [phone]  [employer]

## 2023-04-26 NOTE — Progress Notes (Signed)
Rounding Note    Patient Name: Caleb Perkins Date of Encounter: 04/24/2023  [facility] Cardiologist: Dina Rich, MD   Subjective   No complaints.   Inpatient Medications    Scheduled Meds:  amLODipine  10 mg Oral Daily   aspirin EC  81 mg Oral Daily   atorvastatin  40 mg Oral Daily   carvedilol  12.5 mg Oral BID WC   Chlorhexidine Gluconate Cloth  6 each Topical Daily   feeding supplement  237 mL Oral BID BM   isosorbide mononitrate  30 mg Oral Daily   multivitamin with minerals  1 tablet Oral Daily   sacubitril-valsartan  1 tablet Oral BID   spironolactone  25 mg Oral Daily   Continuous Infusions:  sodium chloride Stopped (04/23/23 0825)   sodium chloride     heparin 1,400 Units/hr (04/24/23 0458)   PRN Meds: sodium chloride, acetaminophen **OR** acetaminophen, hydrALAZINE, ondansetron **OR** ondansetron (ZOFRAN) IV   Vital Signs    Vitals:   04/23/23 1800 04/23/23 1900 04/23/23 1946 04/24/23 0436  BP:   122/81 121/78  Pulse: 79 79 81 71  Resp:   20   Temp:   98.8 F (37.1 C) 98.1 F (36.7 C)  TempSrc:   Oral Oral  SpO2: 97% 98% 98% 96%  Weight:      Height:        Intake/Output Summary (Last 24 hours) at 04/24/2023 0748 Last data filed at 04/24/2023 0458 Gross per 24 hour  Intake 1025.79 ml  Output 800 ml  Net 225.79 ml      04/22/2023   12:37 PM 04/22/2023    5:00 AM 04/21/2023    5:00 AM  Last 3 Weights  Weight (lbs) 144 lb 2.9 oz 145 lb 15.1 oz 145 lb 15.1 oz  Weight (kg) 65.4 kg 66.2 kg 66.2 kg      Telemetry    sinus - Personally Reviewed  ECG    Sinus-personally reviewed  Physical Exam   General: Well developed, well nourished, NAD  HEENT: OP clear, mucus membranes moist  SKIN: warm, dry. No rashes. Neuro: No focal deficits  Musculoskeletal: Muscle strength 5/5 all ext  Psychiatric: Mood and affect normal  Neck: No JVD Lungs:Clear bilaterally Cardiovascular: Regular rate and rhythm.  Abdomen:Soft.   Extremities: No lower extremity edema.   Labs    High Sensitivity Troponin:   Recent Labs  Lab 04/19/23 0741 04/19/23 0948  TROPONINIHS 78* 102*     Chemistry Recent Labs  Lab 04/20/23 0518 04/22/23 0841 04/22/23 1622 04/22/23 1629 04/23/23 0602 04/24/23 0205  NA 139 135   < > 137 136 138  K 3.5 3.7   < > 4.0 4.2 4.1  CL 104 103  --   --  105 108  CO2 25 22  --   --  20* 23  GLUCOSE 97 109*  --   --  104* 106*  BUN 16 19  --   --  17 28*  CREATININE 1.03 1.04  --   --  0.97 1.05  CALCIUM 9.2 9.1  --   --  9.2 9.1  MG 2.0  --   --   --  2.1  --   PROT 7.0  --   --   --  6.8 6.6  ALBUMIN 4.1  --   --   --  3.7 3.5  AST 20  --   --   --  15 14*  ALT 18  --   --   --  13 13  ALKPHOS 56  --   --   --  49 51  BILITOT 1.3*  --   --   --  1.1 0.9  GFRNONAA >60 >60  --   --  >60 >60  ANIONGAP 10 10  --   --  11 7   < > = values in this interval not displayed.    Lipids  Recent Labs  Lab 04/20/23 0518  CHOL 181  TRIG 116  HDL 35*  LDLCALC 123*  CHOLHDL 5.2    Hematology Recent Labs  Lab 04/22/23 0841 04/22/23 1622 04/22/23 1629 04/23/23 0602 04/24/23 0205  WBC 9.5  --   --  8.6 9.0  RBC 4.61  --   --  4.93 4.87  HGB 14.6   < > 15.0 15.7 15.6  HCT 42.9   < > 44.0 45.6 45.6  MCV 93.1  --   --  92.5 93.6  MCH 31.7  --   --  31.8 32.0  MCHC 34.0  --   --  34.4 34.2  RDW 12.4  --   --  12.4 12.4  PLT 152  --   --  142* 146*   < > = values in this interval not displayed.   Thyroid  Recent Labs  Lab 04/20/23 0518  TSH 0.963    BNP Recent Labs  Lab 04/19/23 0741  BNP 495.0*    DDimer No results for input(s): "DDIMER" in the last 168 hours.   Radiology    VAS US DOPPLER PRE CABG  Result Date: 04/23/2023 PREOPERATIVE VASCULAR EVALUATION Patient Name:  Caleb Perkins  Date of Exam:   04/23/2023 Medical Rec #: [id number]        Accession #:    [id number] Date of Birth: 1976/04/25         Patient Gender: M Patient Age:   47 years Exam Location:  [facility] Procedure:      VAS US DOPPLER PRE CABG Referring Phys: Eugenio Hoes --------------------------------------------------------------------------------  Indications:      Pre-CABG. Risk Factors:     Hypertension, past history of smoking, coronary artery                   disease. Comparison Study: No prior studies. Performing Technologist: Jean Rosenthal RDMS, RVT  Examination Guidelines: A complete evaluation includes B-mode imaging, spectral Doppler, color Doppler, and power Doppler as needed of all accessible portions of each vessel. Bilateral testing is considered an integral part of a complete examination. Limited examinations for reoccurring indications may be performed as noted.  Right Carotid Findings: +----------+--------+--------+--------+--------+--------+           PSV cm/sEDV cm/sStenosisDescribeComments +----------+--------+--------+--------+--------+--------+ CCA Prox  110     16                               +----------+--------+--------+--------+--------+--------+ CCA Distal102     17                               +----------+--------+--------+--------+--------+--------+ ICA Prox  40      12      1-39%   calcific         +----------+--------+--------+--------+--------+--------+ ICA Mid   63      23                               +----------+--------+--------+--------+--------+--------+  ICA Distal78      25                               +----------+--------+--------+--------+--------+--------+ ECA       201     12                               +----------+--------+--------+--------+--------+--------+ +----------+--------+-------+----------------+------------+           PSV cm/sEDV cmsDescribe        Arm Pressure +----------+--------+-------+----------------+------------+ [id number]             Multiphasic, WNL             +----------+--------+-------+----------------+------------+ +---------+--------+--+--------+-+---------+  VertebralPSV cm/s38EDV cm/s8Antegrade +---------+--------+--+--------+-+---------+ Left Carotid Findings: +----------+--------+--------+--------+----------------------------+--------+           PSV cm/sEDV cm/sStenosisDescribe                    Comments +----------+--------+--------+--------+----------------------------+--------+ CCA Prox  68      12                                                   +----------+--------+--------+--------+----------------------------+--------+ CCA Distal75      16                                                   +----------+--------+--------+--------+----------------------------+--------+ ICA Prox  62      14      1-39%   hyperechoic and heterogenous         +----------+--------+--------+--------+----------------------------+--------+ ICA Mid   81      32                                                   +----------+--------+--------+--------+----------------------------+--------+ ICA Distal58      25                                                   +----------+--------+--------+--------+----------------------------+--------+ ECA       154     7                                                    +----------+--------+--------+--------+----------------------------+--------+ +----------+--------+--------+----------------+------------+ SubclavianPSV cm/sEDV cm/sDescribe        Arm Pressure +----------+--------+--------+----------------+------------+           145             Multiphasic, WNL             +----------+--------+--------+----------------+------------+ +---------+--------+--+--------+--+---------+ VertebralPSV cm/s58EDV cm/s25Antegrade +---------+--------+--+--------+--+---------+  ABI Findings: +--------+------------------+-----+---------+--------+ Right   Rt Pressure (mmHg)IndexWaveform Comment  +--------+------------------+-----+---------+--------+ Brachial  triphasic         +--------+------------------+-----+---------+--------+ PTA                            triphasic         +--------+------------------+-----+---------+--------+ DP                             triphasic         +--------+------------------+-----+---------+--------+ +--------+------------------+-----+---------+-------+ Left    Lt Pressure (mmHg)IndexWaveform Comment +--------+------------------+-----+---------+-------+ Brachial                       triphasic        +--------+------------------+-----+---------+-------+ PTA                            triphasic        +--------+------------------+-----+---------+-------+ DP                             triphasic        +--------+------------------+-----+---------+-------+  Right Doppler Findings: +--------+--------+-----+---------+--------+ Site    PressureIndexDoppler  Comments +--------+--------+-----+---------+--------+ Brachial             triphasic         +--------+--------+-----+---------+--------+ Radial               triphasic         +--------+--------+-----+---------+--------+ Ulnar                triphasic         +--------+--------+-----+---------+--------+  Left Doppler Findings: +--------+--------+-----+---------+--------+ Site    PressureIndexDoppler  Comments +--------+--------+-----+---------+--------+ Brachial             triphasic         +--------+--------+-----+---------+--------+ Radial               triphasic         +--------+--------+-----+---------+--------+ Ulnar                triphasic         +--------+--------+-----+---------+--------+   Summary: Right Carotid: Velocities in the right ICA are consistent with a 1-39% stenosis. Left Carotid: Velocities in the left ICA are consistent with a 1-39% stenosis. Vertebrals:  Bilateral vertebral arteries demonstrate antegrade flow. Subclavians: Normal flow hemodynamics were seen in bilateral  subclavian              arteries. Right ABI: Triphasic PTA, multiphasic DPA. Left ABI: Triphasic PTA and DPA.  Right Upper Extremity: Doppler waveforms remain within normal limits with right radial compression. Doppler waveforms remain within normal limits with right ulnar compression. Left Upper Extremity: Doppler waveforms remain within normal limits with left radial compression. Doppler waveforms remain within normal limits with left ulnar compression.  Electronically signed by Coral Else MD on 04/23/2023 at 7:26:57 PM.    Final    CARDIAC CATHETERIZATION  Result Date: 04/22/2023 Conclusions: Severe three-vessel coronary artery disease, as detailed below, including sequential 95% ostial and 80-90% mid LAD stenoses, 60% OM1 lesion, 70-80% mid/distal LCx disease, and chronic total occlusion of proximal/mid RCA with left-to-right and right-to-right collaterals. Moderately reduced left ventricular systolic function (LVEF 35-45%). Normal left heart, right heart, and pulmonary artery pressures. Normal Fick cardiac output/index. Recommendations: Gentle post-catheterization hydration; aim for net even fluid balance. Escalate goal directed medical therapy for acute HFrEF; I  will add Entresto 24-26 mg BID beginning this evening. Cardiac surgery consultation for CABG. Aggressive secondary prevention of coronary artery disease. Start heparin infusion 2 hours after TR band removal in the setting of severe multivessel CAD and elevated troponin this admission. Yvonne Kendall, MD [facility]   Cardiac Studies   Cath: 04/22/2023   Conclusions: Severe three-vessel coronary artery disease, as detailed below, including sequential 95% ostial and 80-90% mid LAD stenoses, 60% OM1 lesion, 70-80% mid/distal LCx disease, and chronic total occlusion of proximal/mid RCA with left-to-right and right-to-right collaterals. Moderately reduced left ventricular systolic function (LVEF 35-45%). Normal left heart, right heart, and  pulmonary artery pressures. Normal Fick cardiac output/index.   Recommendations: Gentle post-catheterization hydration; aim for net even fluid balance. Escalate goal directed medical therapy for acute HFrEF; I will add Entresto 24-26 mg BID beginning this evening. Cardiac surgery consultation for CABG. Aggressive secondary prevention of coronary artery disease. Start heparin infusion 2 hours after TR band removal in the setting of severe multivessel CAD and elevated troponin this admission.   Yvonne Kendall, MD [facility]   Diagnostic Dominance: Right  Echo: 04/19/2023   IMPRESSIONS     1. Left ventricular ejection fraction, by estimation, is 35 to 40%. The  left ventricle has moderately decreased function. The left ventricle  demonstrates global hypokinesis. There is moderate left ventricular  hypertrophy. Left ventricular diastolic  parameters are consistent with Grade II diastolic dysfunction  (pseudonormalization). Elevated left atrial pressure.   2. Right ventricular systolic function is normal. The right ventricular  size is normal. There is severely elevated pulmonary artery systolic  pressure.   3. Left atrial size was mildly dilated.   4. Right atrial size was mildly dilated.   5. The mitral valve is abnormal. Mild mitral valve regurgitation. No  evidence of mitral stenosis.   6. The tricuspid valve is abnormal.   7. The aortic valve is tricuspid. Aortic valve regurgitation is mild. No  aortic stenosis is present.   8. Aortic dilatation noted. There is mild dilatation of the ascending  aorta, measuring 40 mm.   9. The inferior vena cava is dilated in size with <50% respiratory  variability, suggesting right atrial pressure of 15 mmHg.   FINDINGS   Left Ventricle: Left ventricular ejection fraction, by estimation, is 35  to 40%. The left ventricle has moderately decreased function. The left  ventricle demonstrates global hypokinesis. The left ventricular  internal  cavity size was normal in size.  There is moderate left ventricular hypertrophy. Left ventricular diastolic  parameters are consistent with Grade II diastolic dysfunction  (pseudonormalization). Elevated left atrial pressure.   Right Ventricle: The right ventricular size is normal. Right vetricular  wall thickness was not well visualized. Right ventricular systolic  function is normal. There is severely elevated pulmonary artery systolic  pressure. The tricuspid regurgitant  velocity is 3.72 m/s, and with an assumed right atrial pressure of 15  mmHg, the estimated right ventricular systolic pressure is 70.4 mmHg.   Left Atrium: Left atrial size was mildly dilated.   Right Atrium: Right atrial size was mildly dilated.   Pericardium: There is no evidence of pericardial effusion.   Mitral Valve: The mitral valve is abnormal. Mild mitral valve  regurgitation. No evidence of mitral valve stenosis.   Tricuspid Valve: The tricuspid valve is abnormal. Tricuspid valve  regurgitation is mild . No evidence of tricuspid stenosis.   Aortic Valve: The aortic valve is tricuspid. Aortic valve regurgitation is  mild. No  aortic stenosis is present. Aortic valve mean gradient measures  1.4 mmHg. Aortic valve peak gradient measures 3.1 mmHg. Aortic valve area,  by VTI measures 5.54 cm.   Pulmonic Valve: The pulmonic valve was not well visualized. Pulmonic valve  regurgitation is not visualized. No evidence of pulmonic stenosis.   Aorta: The aortic root is normal in size and structure and aortic  dilatation noted. There is mild dilatation of the ascending aorta,  measuring 40 mm.   Venous: The inferior vena cava is dilated in size with less than 50%  respiratory variability, suggesting right atrial pressure of 15 mmHg.   IAS/Shunts: No atrial level shunt detected by color flow Doppler.    Patient Profile     47 y.o. male with hx of HTN ("for most of my life"/on and off medication),  tobacco abuse, THC use, prior homelessness (now has a residence) admitted with hypertensive emergency and acute CHF, found to have systolic dysfunction.   Assessment & Plan    CAD/NSTEMI: Pt now s/p 5V CABG. Doing well today. Sinus rhythm. BP stable. Continue ASA, statin, beta blocker and Imdur. Consider changing ASA to Plavix at time of discharge (admitted with NSTEMI).    Ischemic cardiomyopathy: LVEF 35-40% prior to CABG. Will continue Coreg, Entresto and aldactone. Consider adding SGLT2i.     Verne Carrow, MD, [facility] 04/24/2023 10:08 AM

## 2023-04-26 NOTE — Progress Notes (Signed)
     [address]       [place] [id number]             [phone]       EVENING ROUNDS  POD #1 SP CABG Sitting in chair Appreciative of care Dealing with HTN, has prn meds ordered

## 2023-04-26 NOTE — Op Note (Signed)
NAME: Caleb Perkins, Caleb Perkins MEDICAL RECORD NO: [id number] ACCOUNT NO: [id number] DATE OF BIRTH: 25-Jul-1976 FACILITY: [facility] LOCATION: [facility] PHYSICIAN: Salvatore Decent. Dorris Fetch, MD  Operative Report   DATE OF PROCEDURE: 04/25/2023  PREOPERATIVE DIAGNOSIS:  Severe 3-vessel coronary artery disease with moderate left ventricular dysfunction.  POSTOPERATIVE DIAGNOSIS:  Severe 3-vessel coronary artery disease with moderate left ventricular dysfunction.  PROCEDURES PERFORMED:   Median sternotomy, extracorporeal circulation, Coronary artery bypass grafting x 5  Left internal mammary artery to LAD,  Left radial artery to posterior descending, Saphenous vein graft to first diagonal, Sequential saphenous vein graft to obtuse marginals 1 and 2,  Open left radial harvest and  Endoscopic vein harvest from right thigh.  SURGEON:  Salvatore Decent. Dorris Fetch, MD  ASSISTANT:  Aloha Gell, PA and Joslin, [place].  ANESTHESIA:  General.  FINDINGS:  Transesophageal echocardiography showed some septal and apical hypokinesis.  No significant valvular pathology. Unchanged to slightly improved post-bypass. Diffusely diseased coronaries. Posterior descending and OM-2 fair quality.  LAD,  diagonal and OM1 good quality at site of anastomosis.  Good quality conduits.  CLINICAL NOTE:  Caleb Perkins is a 47 year old man with multiple cardiac risk factors, who presented with chest pain and was found to be in hypertensive emergency and also had elevated troponin.  He underwent cardiac catheterization where he was found to have severe 3-vessel disease.  Workup also included an echocardiogram which showed an ejection fraction of 35-40% and some mild mitral, aortic, and tricuspid insufficiency.  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  He was advised to undergo coronary artery bypass grafting.  The indications, risks, benefits, and alternatives were discussed in detail with the patient.  He understood and accepted the  risks and agreed to proceed.  OPERATIVE NOTE: [name] was brought to the preoperative holding area on 04/25/2023.  Anesthesia under direction of Dr. Renold Don, placed a Swan-Ganz catheter and an arterial blood pressure monitoring line.  He was taken to the operating room and  anesthetized and intubated.  A Foley catheter was placed.  Intravenous antibiotics were administered.  Transesophageal echocardiography was performed by Dr. Renold Don.  Please see his separately dictated note for full details, but findings were as noted  above.  Of note, the patient's Allen's test was normal in the preoperative holding area and that was confirmed again in the operating room. The chest, abdomen, legs and left arm were prepped and draped in the usual sterile fashion.  A timeout was performed.  The conduits were harvested simultaneously.  I performed a median sternotomy and harvested the left internal mammary artery under direct vision.  Simultaneously, Myron Roddenberry harvested the left radial artery using an open technique.  Incision was made on the distal aspect of the forearm.  Initially, a short segment of the radial artery was dissected out.  There was a good pulse distally with proximal occlusion, correlating with the preoperative Allen's test.  The incision then was extended to the antecubital fossa and the radial artery was harvested using the Harmonic scalpel. Also Aloha Gell harvested the greater saphenous vein from the right leg endoscopically.  An incision was made just below the knee.  The vein was harvested from that site to the groin.  All the conduits were of good quality.  2000 units of heparin was administered during the vessel harvest.  The remainder of the full heparin dose was given prior to opening the pericardium.  After harvesting the radial and saphenous vein, the arm and leg incisions  were closed in standard fashion.  The arm then was tucked back to the patient's side.  The remainder of  the heparin was given.  The sternal retractor was placed and was opened over time.  The  pericardium was opened.  The ascending aorta was inspected.  It was about 4 cm in diameter with no evidence of significant atherosclerotic disease.  After confirming adequate anticoagulation with ACT measurement, the aorta was cannulated via concentric  2-0 Ethibond pledgeted pursestring sutures.  A dual stage venous cannula was placed via a pursestring suture in right atrial appendage.  Cardiopulmonary bypass was initiated.  Flows were maintained per protocol.  The patient was cooled to 32 degrees  Celsius.  The coronary arteries were inspected and anastomotic sites were chosen.  The conduits were inspected and cut to length.  A foam pad was placed in the pericardium to insulate the heart.  A temperature probe was placed in the myocardial septum and a cardioplegia cannula was placed in the ascending aorta.  The aorta was crossclamped.  The left ventricle was emptied via the aortic root vent.  Cardiac arrest then was achieved with a combination of cold antegrade blood cardioplegia and topical iced saline.  1.5 liters of cardioplegia was administered.  There was a rapid diastolic arrest and there was septal cooling, ultimately to 10 degrees Celsius.  A reversed saphenous vein graft was placed end-to-side to the first diagonal branch of the LAD.  This vessel was calcified proximally, but of good quality distally at the site of anastomosis. A 1.5 mm probe did pass distally.  The end-to-side  anastomosis was performed with a running 7-0 Prolene suture.  All anastomoses were probed proximally and distally at their completion and cardioplegia was administered down the vein grafts and radial at their completion.  There was good flow and good  hemostasis.  Next a reversed saphenous vein graft was placed sequentially to obtuse marginals 1 and 2.  Obtuse marginal 1 was the larger of the two branches.  Obtuse marginal 2 was  smaller and tortuous.  It was a fair quality target.  Obtuse marginal 1 was a good  quality target at the site of anastomosis, but it did have diffuse plaque throughout.  A side-to-side anastomosis was performed to OM1 and end-to-side to OM2.  The probe passed easily proximally and distally.  Cardioplegia was administered down the  graft and there was good flow and good hemostasis.  Additional cardioplegia was also administered down the aortic root.  Then, the inferior wall of the heart was exposed. The posterior descending was a large caliber 2 mm vessel, but it was diffusely diseased.  A 1.5 mm probe did pass proximally and  distally from the site of anastomosis, but it was only a fair quality target.  The radial artery was anastomosed end-to-side with a running 8-0 Prolene suture.  At the completion of the radial anastomosis, a probe passed easily proximally and distally.  Cardioplegia was administered and there was good flow and good hemostasis.  The left internal mammary artery was brought through a window in the pericardium.  The distal end was bevelled.  It was anastomosed end-to-side to the distal LAD. The LAD was a 1.5 mm good quality target.  The mammary was a good quality conduit.  The end-to-side anastomosis was performed with a running 8-0 Prolene suture.  At the completion of the anastomosis, the bulldog clamp was briefly removed to inspect for hemostasis.  Rapid septal rewarming was noted.  The  bulldog clamp was replaced.  Additional cardioplegia was administered.  The cardioplegia cannula was removed from the ascending aorta.  The vein grafts and radial artery had been cut to length.  The proximal anastomoses were performed to 4.5 mm punch aortotomies.  A 7-0 Prolene suture was used for the radial and a running 6-0 Prolene suture for the vein grafts. At the completion of the final proximal anastomosis, the patient was placed in Trendelenburg position.  Lidocaine was administered.  The  aortic root was de-aired and the aortic crossclamp was removed.  The total crossclamp time was 100 minutes.  The patient spontaneously resumed sinus rhythm, although he was bradycardic.  He did not require defibrillation.  While rewarming was completed, all proximal and distal anastomoses were inspected for hemostasis.  Epicardial pacing wires were placed on the right ventricle and right atrium.  When the patient had rewarmed to a core temperature of 37 degrees Celsius, he was atrially paced at 80 beats per minute.  He then weaned from cardiopulmonary bypass on the first attempt without inotropic support.  Total bypass time was 139 minutes.  The initial cardiac index was greater than 2 liters per minute per meter squared and the patient remained hemodynamically stable throughout the post-bypass period.  A test dose of protamine was administered and was well tolerated.  The atrial and aortic cannulae were removed.  The remainder of the protamine was administered without incident.  The chest was irrigated with warm saline.  Hemostasis was achieved.  The pericardium was reapproximated over the aorta and base of the heart with interrupted 3-0 silk sutures and it came together easily without tension.  Left pleural and mediastinal chest tubes were placed through separate subcostal incisions and secured with #1 silk sutures.  The sternum was closed with a combination of single and double heavy gauge stainless steel wires. The pectoralis fascia and subcutaneous tissue were closed in standard fashion.  There was a missing 8-0 needle from the count.  A chest x-ray was performed and no needle was identified.  The skin then was closed in standard fashion.  The chest tubes were placed to a Pleur-Evac on suction.  All sponge and instrument counts were correct.  The patient was transported from the operating room to the surgical intensive care unit, intubated and in good condition.  Experienced assistance was necessary for  this case due to surgical complexity.  Bailey Stehler harvested the saphenous vein independently and closed the leg incision.  She also provided exposure, retraction of delicate tissues and suture management and suctioning during the anastomoses.  Jillyn Hidden independently harvested the radial artery and closed the arm incision.   MUK D: 04/25/2023 5:19:32 pm T: 04/26/2023 1:42:00 am  JOB: [id number]/ [id number]

## 2023-04-27 ENCOUNTER — Inpatient Hospital Stay (HOSPITAL_COMMUNITY): Payer: Medicaid Other

## 2023-04-27 DIAGNOSIS — I161 Hypertensive emergency: Secondary | ICD-10-CM | POA: Diagnosis not present

## 2023-04-27 LAB — CBC
HCT: 35.1 % — ABNORMAL LOW (ref 39.0–52.0)
Hemoglobin: 11.2 g/dL — ABNORMAL LOW (ref 13.0–17.0)
MCH: 31.2 pg (ref 26.0–34.0)
MCHC: 31.9 g/dL (ref 30.0–36.0)
MCV: 97.8 fL (ref 80.0–100.0)
Platelets: 100 10*3/uL — ABNORMAL LOW (ref 150–400)
RBC: 3.59 MIL/uL — ABNORMAL LOW (ref 4.22–5.81)
RDW: 12.4 % (ref 11.5–15.5)
WBC: 11.5 10*3/uL — ABNORMAL HIGH (ref 4.0–10.5)
nRBC: 0 % (ref 0.0–0.2)

## 2023-04-27 LAB — BASIC METABOLIC PANEL
Anion gap: 3 — ABNORMAL LOW (ref 5–15)
BUN: 13 mg/dL (ref 6–20)
CO2: 28 mmol/L (ref 22–32)
Calcium: 8.7 mg/dL — ABNORMAL LOW (ref 8.9–10.3)
Chloride: 103 mmol/L (ref 98–111)
Creatinine, Ser: 0.79 mg/dL (ref 0.61–1.24)
GFR, Estimated: >60 mL/min (ref >60)
Glucose, Bld: 107 mg/dL — ABNORMAL HIGH (ref 70–99)
Potassium: 3.9 mmol/L (ref 3.5–5.1)
Sodium: 134 mmol/L — ABNORMAL LOW (ref 135–145)

## 2023-04-27 LAB — GLUCOSE, CAPILLARY
Glucose-Capillary: 123 mg/dL — ABNORMAL HIGH (ref 70–99)
Glucose-Capillary: 119 mg/dL — ABNORMAL HIGH (ref 70–99)
Glucose-Capillary: 88 mg/dL (ref 70–99)
Glucose-Capillary: 79 mg/dL (ref 70–99)
Glucose-Capillary: 119 mg/dL — ABNORMAL HIGH (ref 70–99)
Glucose-Capillary: 161 mg/dL — ABNORMAL HIGH (ref 70–99)

## 2023-04-27 MED ORDER — METOPROLOL TARTRATE 50 MG PO TABS
50.0000 mg | ORAL_TABLET | Freq: Two times a day (BID) | ORAL | Status: DC
  Administered 2023-04-27 – 2023-04-28 (×3): 50 mg via ORAL
  Filled 2023-04-27 (×3): qty 1

## 2023-04-27 MED ORDER — AMIODARONE HCL IN DEXTROSE 360-4.14 MG/200ML-% IV SOLN
30.0000 mg/h | INTRAVENOUS | Status: DC
  Administered 2023-04-28: 30 mg/h via INTRAVENOUS
  Filled 2023-04-27: qty 200

## 2023-04-27 MED ORDER — AMIODARONE HCL IN DEXTROSE 360-4.14 MG/200ML-% IV SOLN
60.0000 mg/h | INTRAVENOUS | Status: AC
  Administered 2023-04-27 (×2): 60 mg/h via INTRAVENOUS
  Filled 2023-04-27: qty 200

## 2023-04-27 MED ORDER — FUROSEMIDE 40 MG PO TABS
40.0000 mg | ORAL_TABLET | Freq: Every day | ORAL | Status: DC
  Administered 2023-04-27 – 2023-04-28 (×2): 40 mg via ORAL
  Filled 2023-04-27 (×2): qty 1

## 2023-04-27 MED ORDER — DAPAGLIFLOZIN PROPANEDIOL 5 MG PO TABS
5.0000 mg | ORAL_TABLET | Freq: Every day | ORAL | Status: DC
  Administered 2023-04-27 – 2023-04-29 (×3): 5 mg via ORAL
  Filled 2023-04-27 (×3): qty 1

## 2023-04-27 MED ORDER — POTASSIUM CHLORIDE CRYS ER 20 MEQ PO TBCR
20.0000 meq | EXTENDED_RELEASE_TABLET | Freq: Every day | ORAL | Status: DC
  Administered 2023-04-27 – 2023-04-29 (×3): 20 meq via ORAL
  Filled 2023-04-27 (×3): qty 1

## 2023-04-27 MED ORDER — AMIODARONE IV BOLUS ONLY 150 MG/100ML
INTRAVENOUS | Status: AC
  Filled 2023-04-27: qty 100

## 2023-04-27 MED ORDER — AMIODARONE LOAD VIA INFUSION
150.0000 mg | Freq: Once | INTRAVENOUS | Status: AC
  Filled 2023-04-27: qty 83.34

## 2023-04-27 MED ORDER — AMIODARONE HCL IN DEXTROSE 360-4.14 MG/200ML-% IV SOLN
INTRAVENOUS | Status: AC
  Administered 2023-04-27: 150 mg via INTRAVENOUS
  Filled 2023-04-27: qty 200

## 2023-04-27 NOTE — Progress Notes (Signed)
      [address]       [place] [address]             [phone]      2 Days Post-Op  Procedure(s) (LRB): CORONARY ARTERY BYPASS GRAFTING (CABG) X 5 USING LEFT INTERNAL MAMMARY ARTERY AND ENDOSCOPICALLY HARVESTED RIGHT GREATER SAPHENOUS VEIN (N/A) RADIAL ARTERY HARVEST (Left) TRANSESOPHAGEAL ECHOCARDIOGRAM (N/A)  Total Length of Stay:  LOS: 8 days    SUBJECTIVE: No complaints Slept well  Vitals:   04/27/23 0800 04/27/23 0810  BP: (!) 154/92   Pulse: 86   Resp:    Temp:  97.8 F (36.6 C)  SpO2: 93%     Intake/Output      04/26 0701 04/27 0700 04/27 0701 04/28 0700   P.O. 360 240   I.V. (mL/kg) 613.7 (9.1)    Blood     IV Piggyback 299.9    Total Intake(mL/kg) 1273.5 (19) 240 (3.6)   Urine (mL/kg/hr) 2460 (1.5)    Emesis/NG output 0    Blood     Chest Tube 10    Total Output 2470    Net -1196.5 +240        Emesis Occurrence 1 x        sodium chloride Stopped (04/26/23 1618)   sodium chloride     sodium chloride 10 mL/hr at 04/27/23 0600    ceFAZolin (ANCEF) IV Stopped (04/27/23 0534)   lactated ringers     lactated ringers     lactated ringers Stopped (04/26/23 1620)   nitroGLYCERIN Stopped (04/26/23 2208)    CBC    Component Value Date/Time   WBC 11.5 (H) 04/27/2023 0318   RBC 3.59 (L) 04/27/2023 0318   HGB 11.2 (L) 04/27/2023 0318   HCT 35.1 (L) 04/27/2023 0318   PLT 100 (L) 04/27/2023 0318   MCV 97.8 04/27/2023 0318   MCH 31.2 04/27/2023 0318   MCHC 31.9 04/27/2023 0318   RDW 12.4 04/27/2023 0318   LYMPHSABS 1.4 04/19/2023 0741   MONOABS 0.5 04/19/2023 0741   EOSABS 0.3 04/19/2023 0741   BASOSABS 0.1 04/19/2023 0741   CMP     Component Value Date/Time   NA 134 (L) 04/27/2023 0318   K 3.9 04/27/2023 0318   CL 103 04/27/2023 0318   CO2 28 04/27/2023 0318   GLUCOSE 107 (H) 04/27/2023 0318   BUN 13 04/27/2023 0318   CREATININE 0.79 04/27/2023 0318   CALCIUM 8.7 (L) 04/27/2023 0318   PROT 6.6 04/24/2023 0205    ALBUMIN 3.5 04/24/2023 0205   AST 14 (L) 04/24/2023 0205   ALT 13 04/24/2023 0205   ALKPHOS 51 04/24/2023 0205   BILITOT 0.9 04/24/2023 0205   GFRNONAA >60 04/27/2023 0318   GFRAA >60 11/10/2019 0447   ABG    Component Value Date/Time   PHART 7.280 (L) 04/25/2023 1859   PCO2ART 42.2 04/25/2023 1859   PO2ART 96 04/25/2023 1859   HCO3 20.0 04/25/2023 1859   TCO2 21 (L) 04/25/2023 1859   ACIDBASEDEF 7.0 (H) 04/25/2023 1859   O2SAT 97 04/25/2023 1859   CBG (last 3)  Recent Labs    04/26/23 2328 04/27/23 0321 04/27/23 0808  GLUCAP 138* 123* 119*  EXAM Lungs;Clear Card: RR Ext: warm Neuro: no focal deficits   ASSESSMENT: POD #2 SP CABG Hemodynamics good with better bp control Pulm: stable, diurese Renal: stable Ok to transfer to floor   Eugenio Hoes, MD 04/27/2023

## 2023-04-27 NOTE — Progress Notes (Signed)
Progress Note  Patient Name: Caleb Perkins Date of Encounter: 04/27/2023  Primary Cardiologist:   Dina Rich, MD   Subjective   Doing well.  Ambulated in hallway.  Denies significant pain or SOB.   Inpatient Medications    Scheduled Meds:  acetaminophen  1,000 mg Oral Q6H   Or   acetaminophen (TYLENOL) oral liquid 160 mg/5 mL  1,000 mg Per Tube Q6H   amLODipine  10 mg Oral Daily   aspirin EC  325 mg Oral Daily   Or   aspirin  324 mg Per Tube Daily   atorvastatin  40 mg Oral Daily   bisacodyl  10 mg Oral Daily   Or   bisacodyl  10 mg Rectal Daily   Chlorhexidine Gluconate Cloth  6 each Topical Daily   docusate sodium  200 mg Oral Daily   enoxaparin (LOVENOX) injection  40 mg Subcutaneous QHS   insulin aspart  0-24 Units Subcutaneous Q4H   isosorbide mononitrate  30 mg Oral Daily   metoprolol tartrate  25 mg Oral BID   Or   metoprolol tartrate  25 mg Per Tube BID   pantoprazole  40 mg Oral Daily   sodium chloride flush  3 mL Intravenous Q12H   Continuous Infusions:  sodium chloride Stopped (04/26/23 1618)   sodium chloride     sodium chloride 10 mL/hr at 04/27/23 0600    ceFAZolin (ANCEF) IV Stopped (04/27/23 0534)   lactated ringers     lactated ringers     lactated ringers Stopped (04/26/23 1620)   nitroGLYCERIN Stopped (04/26/23 2208)   PRN Meds: sodium chloride, dextrose, hydrALAZINE, lactated ringers, metoprolol tartrate, midazolam, morphine injection, ondansetron (ZOFRAN) IV, mouth rinse, oxyCODONE, sodium chloride flush, traMADol   Vital Signs    Vitals:   04/27/23 0615 04/27/23 0630 04/27/23 0645 04/27/23 0700  BP: (!) 132/92 (!) 143/89 (!) 126/96 (!) 136/99  Pulse: 76 83 87 89  Resp: 12 (!) 21 (!) 25 19  Temp:      TempSrc:      SpO2: 91% 92% 94% 94%  Weight:      Height:        Intake/Output Summary (Last 24 hours) at 04/27/2023 0755 Last data filed at 04/27/2023 0600 Gross per 24 hour  Intake 1273.54 ml  Output 2470 ml  Net  -1196.46 ml   Filed Weights   04/25/23 0423 04/26/23 0500 04/27/23 0500  Weight: 64.4 kg 70.7 kg 67.2 kg    Telemetry    NSR - Personally Reviewed  ECG    NA - Personally Reviewed  Physical Exam   GEN: No acute distress.   Neck: No  JVD Cardiac: RRR, no murmurs, rubs, or gallops.  Respiratory: Clear  to auscultation bilaterally. GI: Soft, nontender, non-distended  MS: No  edema; No deformity. Neuro:  Nonfocal  Psych: Normal affect   Labs    Chemistry Recent Labs  Lab 04/23/23 0602 04/24/23 0205 04/25/23 0854 04/26/23 0410 04/26/23 1904 04/27/23 0318  NA 136 138   < > 134* 134* 134*  K 4.2 4.1   < > 4.8 3.9 3.9  CL 105 108   < > 104 100 103  CO2 20* 23   < > 22 26 28   GLUCOSE 104* 106*   < > 113* 151* 107*  BUN 17 28*   < > 17 15 13   CREATININE 0.97 1.05   < > 0.80 0.86 0.79  CALCIUM 9.2 9.1   < >  8.0* 8.6* 8.7*  PROT 6.8 6.6  --   --   --   --   ALBUMIN 3.7 3.5  --   --   --   --   AST 15 14*  --   --   --   --   ALT 13 13  --   --   --   --   ALKPHOS 49 51  --   --   --   --   BILITOT 1.1 0.9  --   --   --   --   GFRNONAA >60 >60   < > >60 >60 >60  ANIONGAP 11 7   < > 8 8 3*   < > = values in this interval not displayed.     Hematology Recent Labs  Lab 04/26/23 0410 04/26/23 1642 04/27/23 0318  WBC 9.8 14.4* 11.5*  RBC 3.58* 3.70* 3.59*  HGB 11.3* 11.6* 11.2*  HCT 34.5* 35.2* 35.1*  MCV 96.4 95.1 97.8  MCH 31.6 31.4 31.2  MCHC 32.8 33.0 31.9  RDW 12.5 12.3 12.4  PLT 97* 124* 100*    Cardiac EnzymesNo results for input(s): "TROPONINI" in the last 168 hours. No results for input(s): "TROPIPOC" in the last 168 hours.   BNPNo results for input(s): "BNP", "PROBNP" in the last 168 hours.   DDimer No results for input(s): "DDIMER" in the last 168 hours.   Radiology    DG Chest Port 1 View  Result Date: 04/26/2023 CLINICAL DATA:  Status post CABG x5. EXAM: PORTABLE CHEST 1 VIEW COMPARISON:  04/25/2023. FINDINGS: 0554 hours. Interval removal  of the endotracheal and enteric tubes. Unchanged position of the right IJ approach pulmonary arterial catheter with tip projecting over the main pulmonary trunk. Unchanged epicardial pacing wires and pleural mediastinal chest tubes x2. Low lung volumes accentuate the pulmonary vasculature and cardiomediastinal silhouette. No pulmonary edema. Trace left pleural effusion with associated left basilar atelectasis. No pneumothorax. IMPRESSION: 1. Interval removal of the endotracheal and enteric tubes. Otherwise unchanged support apparatus. 2. No pulmonary edema. Trace left pleural effusion and left basilar atelectasis. Electronically Signed   By: Orvan Falconer M.D.   On: 04/26/2023 08:35   DG Chest Port 1 View  Result Date: 04/25/2023 CLINICAL DATA:  Post CABG EXAM: PORTABLE CHEST 1 VIEW COMPARISON:  Portable exam 1423 hours compared to 1328 hours FINDINGS: Tip of endotracheal tube projects 5.1 cm above carina. Nasogastric tube extends into stomach. Mediastinal drain and LEFT thoracostomy tube present. RIGHT jugular Swan-Ganz catheter tip projects over proximal RIGHT pulmonary artery. Epicardial pacing wires present. Normal heart size post CABG. Scattered atelectasis mid to lower lungs bilaterally greater on LEFT. No pleural effusion or pneumothorax. IMPRESSION: Expected postoperative changes see following CABG. Electronically Signed   By: Ulyses Southward M.D.   On: 04/25/2023 14:46   EP STUDY  Result Date: 04/25/2023 See surgical note for result.  DG Chest Port 1 View  Result Date: 04/25/2023 CLINICAL DATA:  Question retained foreign body EXAM: PORTABLE CHEST 1 VIEW COMPARISON:  Portable exam 1328 hours compared to preoperative study of 04/19/2023 FINDINGS: Tip of endotracheal tube projects at thoracic inlet. Mediastinal drains and epicardial pacing wires noted. RIGHT jugular Swan-Ganz catheter tip projects over bifurcation of pulmonary artery. Normal heart size post CABG. Scattered atelectasis LEFT lung with  RIGHT lung clear. Expected scattered surgical clips. IMPRESSION: Expected postoperative changes of CABG. Findings discussed with Dr. Dorris Fetch on 04/25/2023 at 1343 hours. Electronically Signed   By: Loraine Leriche  Tyron Russell M.D.   On: 04/25/2023 13:44    Cardiac Studies   Echo:     1. Left ventricular ejection fraction, by estimation, is 35 to 40%. The  left ventricle has moderately decreased function. The left ventricle  demonstrates global hypokinesis. There is moderate left ventricular  hypertrophy. Left ventricular diastolic  parameters are consistent with Grade II diastolic dysfunction  (pseudonormalization). Elevated left atrial pressure.   2. Right ventricular systolic function is normal. The right ventricular  size is normal. There is severely elevated pulmonary artery systolic  pressure.   3. Left atrial size was mildly dilated.   4. Right atrial size was mildly dilated.   5. The mitral valve is abnormal. Mild mitral valve regurgitation. No  evidence of mitral stenosis.   6. The tricuspid valve is abnormal.   7. The aortic valve is tricuspid. Aortic valve regurgitation is mild. No  aortic stenosis is present.   8. Aortic dilatation noted. There is mild dilatation of the ascending  aorta, measuring 40 mm.   9. The inferior vena cava is dilated in size with <50% respiratory  variability, suggesting right atrial pressure of 15 mmHg.   Cath  Diagnostic Dominance: Right     Patient Profile     47 y.o. male with hx of HTN ("for most of my life"/on and off medication), tobacco abuse, THC use, prior homelessness (now has a residence) admitted with hypertensive emergency and acute CHF, found to have systolic dysfunction.   Assessment & Plan    CAD:  NSTEMI.  Status post CABG.  Continue current care.  ISCHEMIC CM:  Acute systolic CHF.  Continue current care and will add [place].     Pressures creeping up.  Will be able to titrate meds further before discharge.    For questions or  updates, please contact [facility] Please consult [email or web address] for contact info under Cardiology/STEMI.   Signed, Rollene Rotunda, MD  04/27/2023, 7:55 AM

## 2023-04-28 DIAGNOSIS — I161 Hypertensive emergency: Secondary | ICD-10-CM | POA: Diagnosis not present

## 2023-04-28 LAB — CBC
HCT: 36.4 % — ABNORMAL LOW (ref 39.0–52.0)
Hemoglobin: 12.5 g/dL — ABNORMAL LOW (ref 13.0–17.0)
MCH: 32.3 pg (ref 26.0–34.0)
MCHC: 34.3 g/dL (ref 30.0–36.0)
MCV: 94.1 fL (ref 80.0–100.0)
Platelets: 133 10*3/uL — ABNORMAL LOW (ref 150–400)
RBC: 3.87 MIL/uL — ABNORMAL LOW (ref 4.22–5.81)
RDW: 12.6 % (ref 11.5–15.5)
WBC: 12.7 10*3/uL — ABNORMAL HIGH (ref 4.0–10.5)
nRBC: 0 % (ref 0.0–0.2)

## 2023-04-28 LAB — BASIC METABOLIC PANEL
Anion gap: 11 (ref 5–15)
BUN: 18 mg/dL (ref 6–20)
CO2: 24 mmol/L (ref 22–32)
Calcium: 9 mg/dL (ref 8.9–10.3)
Chloride: 100 mmol/L (ref 98–111)
Creatinine, Ser: 0.95 mg/dL (ref 0.61–1.24)
GFR, Estimated: >60 mL/min (ref >60)
Glucose, Bld: 111 mg/dL — ABNORMAL HIGH (ref 70–99)
Potassium: 3.8 mmol/L (ref 3.5–5.1)
Sodium: 135 mmol/L (ref 135–145)

## 2023-04-28 LAB — GLUCOSE, CAPILLARY
Glucose-Capillary: 145 mg/dL — ABNORMAL HIGH (ref 70–99)
Glucose-Capillary: 98 mg/dL (ref 70–99)
Glucose-Capillary: 101 mg/dL — ABNORMAL HIGH (ref 70–99)

## 2023-04-28 MED ORDER — SACUBITRIL-VALSARTAN 24-26 MG PO TABS
1.0000 | ORAL_TABLET | Freq: Two times a day (BID) | ORAL | Status: DC
  Administered 2023-04-28 – 2023-04-30 (×5): 1 via ORAL
  Filled 2023-04-28 (×6): qty 1

## 2023-04-28 MED ORDER — AMIODARONE IV BOLUS ONLY 150 MG/100ML
INTRAVENOUS | Status: AC
  Filled 2023-04-28: qty 100

## 2023-04-28 MED ORDER — LOSARTAN POTASSIUM 25 MG PO TABS: 25.0000 mg | ORAL_TABLET | Freq: Every day | ORAL | Status: DC

## 2023-04-28 MED ORDER — AMIODARONE HCL 200 MG PO TABS
400.0000 mg | ORAL_TABLET | Freq: Two times a day (BID) | ORAL | Status: DC
  Administered 2023-04-28 – 2023-05-01 (×7): 400 mg via ORAL
  Filled 2023-04-28 (×7): qty 2

## 2023-04-28 MED ORDER — AMIODARONE IV BOLUS ONLY 150 MG/100ML
150.0000 mg | Freq: Once | INTRAVENOUS | Status: AC
  Administered 2023-04-28: 150 mg via INTRAVENOUS

## 2023-04-28 MED ORDER — METOPROLOL TARTRATE 50 MG PO TABS: 50.0000 mg | ORAL_TABLET | Freq: Three times a day (TID) | ORAL | Status: DC

## 2023-04-28 MED ORDER — CARVEDILOL 12.5 MG PO TABS
12.5000 mg | ORAL_TABLET | Freq: Two times a day (BID) | ORAL | Status: DC
  Administered 2023-04-28: 12.5 mg via ORAL
  Filled 2023-04-28: qty 1

## 2023-04-28 NOTE — Progress Notes (Signed)
Progress Note  Patient Name: Caleb Perkins Date of Encounter: 04/28/2023  Primary Cardiologist:   Dina Rich, MD   Subjective   Denies pain or SOB. Ambulated without diffculty  Inpatient Medications    Scheduled Meds:  acetaminophen  1,000 mg Oral Q6H   Or   acetaminophen (TYLENOL) oral liquid 160 mg/5 mL  1,000 mg Per Tube Q6H   amiodarone  400 mg Oral BID   amLODipine  10 mg Oral Daily   aspirin EC  325 mg Oral Daily   Or   aspirin  324 mg Per Tube Daily   atorvastatin  40 mg Oral Daily   bisacodyl  10 mg Oral Daily   Or   bisacodyl  10 mg Rectal Daily   Chlorhexidine Gluconate Cloth  6 each Topical Daily   dapagliflozin propanediol  5 mg Oral Daily   docusate sodium  200 mg Oral Daily   enoxaparin (LOVENOX) injection  40 mg Subcutaneous QHS   furosemide  40 mg Oral Daily   isosorbide mononitrate  30 mg Oral Daily   metoprolol tartrate  50 mg Oral TID   pantoprazole  40 mg Oral Daily   potassium chloride  20 mEq Oral Daily   sodium chloride flush  3 mL Intravenous Q12H   Continuous Infusions:  sodium chloride Stopped (04/26/23 1618)   sodium chloride     sodium chloride 10 mL/hr at 04/27/23 0800   lactated ringers     lactated ringers     lactated ringers Stopped (04/26/23 1620)   nitroGLYCERIN Stopped (04/26/23 2208)   PRN Meds: sodium chloride, dextrose, hydrALAZINE, lactated ringers, metoprolol tartrate, midazolam, morphine injection, ondansetron (ZOFRAN) IV, mouth rinse, oxyCODONE, sodium chloride flush, traMADol   Vital Signs    Vitals:   04/28/23 0600 04/28/23 0601 04/28/23 0733 04/28/23 0817  BP: (!) 126/108  (!) 161/101   Pulse:  (!) 120 63   Resp:   20   Temp:    98.3 F (36.8 C)  TempSrc:    Oral  SpO2:   95%   Weight:      Height:        Intake/Output Summary (Last 24 hours) at 04/28/2023 0834 Last data filed at 04/28/2023 0553 Gross per 24 hour  Intake 1102.59 ml  Output 3395 ml  Net -2292.41 ml   Filed Weights    04/26/23 0500 04/27/23 0500 04/28/23 0500  Weight: 70.7 kg 67.2 kg 66.6 kg    Telemetry    NSR, PAF - Personally Reviewed  ECG    NA - Personally Reviewed  Physical Exam   GEN: No  acute distress.   Neck: No  JVD Cardiac: RRR, no murmurs, rubs, or gallops.  Respiratory: Clear   to auscultation bilaterally. GI: Soft, nontender, non-distended, normal bowel sounds  MS:  No edema; No deformity. Neuro:   Nonfocal  Psych: Oriented and appropriate    Labs    Chemistry Recent Labs  Lab 04/23/23 0602 04/24/23 0205 04/25/23 0854 04/26/23 1904 04/27/23 0318 04/28/23 0715  NA 136 138   < > 134* 134* 135  K 4.2 4.1   < > 3.9 3.9 3.8  CL 105 108   < > 100 103 100  CO2 20* 23   < > 26 28 24   GLUCOSE 104* 106*   < > 151* 107* 111*  BUN 17 28*   < > 15 13 18   CREATININE 0.97 1.05   < > 0.86 0.79 0.95  CALCIUM 9.2 9.1   < > 8.6* 8.7* 9.0  PROT 6.8 6.6  --   --   --   --   ALBUMIN 3.7 3.5  --   --   --   --   AST 15 14*  --   --   --   --   ALT 13 13  --   --   --   --   ALKPHOS 49 51  --   --   --   --   BILITOT 1.1 0.9  --   --   --   --   GFRNONAA >60 >60   < > >60 >60 >60  ANIONGAP 11 7   < > 8 3* 11   < > = values in this interval not displayed.     Hematology Recent Labs  Lab 04/26/23 1642 04/27/23 0318 04/28/23 0715  WBC 14.4* 11.5* 12.7*  RBC 3.70* 3.59* 3.87*  HGB 11.6* 11.2* 12.5*  HCT 35.2* 35.1* 36.4*  MCV 95.1 97.8 94.1  MCH 31.4 31.2 32.3  MCHC 33.0 31.9 34.3  RDW 12.3 12.4 12.6  PLT 124* 100* 133*    Cardiac EnzymesNo results for input(s): "TROPONINI" in the last 168 hours. No results for input(s): "TROPIPOC" in the last 168 hours.   BNPNo results for input(s): "BNP", "PROBNP" in the last 168 hours.   DDimer No results for input(s): "DDIMER" in the last 168 hours.   Radiology    DG Chest Port 1 View  Result Date: 04/27/2023 CLINICAL DATA:  History of CABG. EXAM: PORTABLE CHEST 1 VIEW COMPARISON:  04/26/2023 FINDINGS: Pulmonary arterial  catheter has been removed. Right jugular central line is still present. Chest tubes have been removed. Negative for pneumothorax. Persistent basilar densities are suggestive for atelectasis. Heart size is upper limits of normal but stable with post CABG changes. IMPRESSION: 1. Removal of chest tubes without pneumothorax. 2. Persistent basilar chest densities are suggestive for atelectasis. Electronically Signed   By: Richarda Overlie M.D.   On: 04/27/2023 09:05    Cardiac Studies   Echo:     1. Left ventricular ejection fraction, by estimation, is 35 to 40%. The  left ventricle has moderately decreased function. The left ventricle  demonstrates global hypokinesis. There is moderate left ventricular  hypertrophy. Left ventricular diastolic  parameters are consistent with Grade II diastolic dysfunction  (pseudonormalization). Elevated left atrial pressure.   2. Right ventricular systolic function is normal. The right ventricular  size is normal. There is severely elevated pulmonary artery systolic  pressure.   3. Left atrial size was mildly dilated.   4. Right atrial size was mildly dilated.   5. The mitral valve is abnormal. Mild mitral valve regurgitation. No  evidence of mitral stenosis.   6. The tricuspid valve is abnormal.   7. The aortic valve is tricuspid. Aortic valve regurgitation is mild. No  aortic stenosis is present.   8. Aortic dilatation noted. There is mild dilatation of the ascending  aorta, measuring 40 mm.   9. The inferior vena cava is dilated in size with <50% respiratory  variability, suggesting right atrial pressure of 15 mmHg.   Cath  Diagnostic Dominance: Right     Patient Profile     47 y.o. male with hx of HTN ("for most of my life"/on and off medication), tobacco abuse, THC use, prior homelessness (now has a residence) admitted with hypertensive emergency and acute CHF, found to have systolic dysfunction.  Assessment & Plan    CAD:  NSTEMI.  Status post  CABG.  Pacing wires still in.  Ambulating and doing well.    ISCHEMIC CM:  Acute systolic CHF.  Continue current care and added [place].  Change to [facility].   Atrial fib:  PAF on amiodarone.    For questions or updates, please contact [facility] Please consult [email or web address] for contact info under Cardiology/STEMI.   Signed, Rollene Rotunda, MD  04/28/2023, 8:34 AM

## 2023-04-28 NOTE — Progress Notes (Signed)
[address]       [place] [address]             [phone]      3 Days Post-Op  Procedure(s) (LRB): CORONARY ARTERY BYPASS GRAFTING (CABG) X 5 USING LEFT INTERNAL MAMMARY ARTERY AND ENDOSCOPICALLY HARVESTED RIGHT GREATER SAPHENOUS VEIN (N/A) RADIAL ARTERY HARVEST (Left) TRANSESOPHAGEAL ECHOCARDIOGRAM (N/A) Total Length of Stay:  LOS: 9 days    SUBJECTIVE: Had several episodes of afib, now back in NSR after second bolus Feels well  Vitals:   04/28/23 0601 04/28/23 0733  BP:  (!) 161/101  Pulse: (!) 120 63  Resp:  20  Temp:    SpO2:  95%    Intake/Output      04/27 0701 04/28 0700 04/28 0701 04/29 0700   P.O. 960    I.V. (mL/kg) 282 (4.2)    IV Piggyback 100.6    Total Intake(mL/kg) 1342.6 (20.2)    Urine (mL/kg/hr) 3480 (2.2)    Emesis/NG output     Stool 0    Chest Tube     Total Output 3480    Net -2137.4         Urine Occurrence 1 x    Stool Occurrence 1 x        sodium chloride Stopped (04/26/23 1618)   sodium chloride     sodium chloride 10 mL/hr at 04/27/23 0800   amiodarone 30 mg/hr (04/28/23 0553)   lactated ringers     lactated ringers     lactated ringers Stopped (04/26/23 1620)   nitroGLYCERIN Stopped (04/26/23 2208)    CBC    Component Value Date/Time   WBC 12.7 (H) 04/28/2023 0715   RBC 3.87 (L) 04/28/2023 0715   HGB 12.5 (L) 04/28/2023 0715   HCT 36.4 (L) 04/28/2023 0715   PLT 133 (L) 04/28/2023 0715   MCV 94.1 04/28/2023 0715   MCH 32.3 04/28/2023 0715   MCHC 34.3 04/28/2023 0715   RDW 12.6 04/28/2023 0715   LYMPHSABS 1.4 04/19/2023 0741   MONOABS 0.5 04/19/2023 0741   EOSABS 0.3 04/19/2023 0741   BASOSABS 0.1 04/19/2023 0741   CMP     Component Value Date/Time   NA 135 04/28/2023 0715   K 3.8 04/28/2023 0715   CL 100 04/28/2023 0715   CO2 24 04/28/2023 0715   GLUCOSE 111 (H) 04/28/2023 0715   BUN 18 04/28/2023 0715   CREATININE 0.95 04/28/2023 0715   CALCIUM 9.0 04/28/2023 0715   PROT 6.6  04/24/2023 0205   ALBUMIN 3.5 04/24/2023 0205   AST 14 (L) 04/24/2023 0205   ALT 13 04/24/2023 0205   ALKPHOS 51 04/24/2023 0205   BILITOT 0.9 04/24/2023 0205   GFRNONAA >60 04/28/2023 0715   GFRAA >60 11/10/2019 0447   ABG    Component Value Date/Time   PHART 7.280 (L) 04/25/2023 1859   PCO2ART 42.2 04/25/2023 1859   PO2ART 96 04/25/2023 1859   HCO3 20.0 04/25/2023 1859   TCO2 21 (L) 04/25/2023 1859   ACIDBASEDEF 7.0 (H) 04/25/2023 1859   O2SAT 97 04/25/2023 1859   CBG (last 3)  Recent Labs    04/27/23 1607 04/27/23 1930 04/27/23 2254  GLUCAP 79 119* 161*  EXAM: Lungs: clear Card: RR Incision clean Ext: warm Neuro: awake and alert   ASSESSMENT: POD #3 SP CABG Hemodynamics: Stable but intermittent afib. Will start po and if stays in NSR to transfer to floor later today HTN: increase BB Continue  to Donneta Romberg, MD 04/28/2023

## 2023-04-29 ENCOUNTER — Encounter (HOSPITAL_COMMUNITY): Payer: Self-pay | Admitting: Thoracic Surgery (Cardiothoracic Vascular Surgery)

## 2023-04-29 ENCOUNTER — Other Ambulatory Visit (HOSPITAL_COMMUNITY): Payer: Self-pay

## 2023-04-29 ENCOUNTER — Telehealth (HOSPITAL_COMMUNITY): Payer: Self-pay | Admitting: Pharmacy Technician

## 2023-04-29 DIAGNOSIS — I161 Hypertensive emergency: Secondary | ICD-10-CM | POA: Diagnosis not present

## 2023-04-29 MED ORDER — MELATONIN 3 MG PO TABS
3.0000 mg | ORAL_TABLET | Freq: Every evening | ORAL | Status: DC | PRN
  Administered 2023-04-29: 3 mg via ORAL
  Filled 2023-04-29: qty 1

## 2023-04-29 MED ORDER — SPIRONOLACTONE 25 MG PO TABS
25.0000 mg | ORAL_TABLET | Freq: Every day | ORAL | Status: DC
  Administered 2023-04-29 – 2023-05-01 (×3): 25 mg via ORAL
  Filled 2023-04-29 (×3): qty 1

## 2023-04-29 MED ORDER — ENSURE ENLIVE PO LIQD
237.0000 mL | Freq: Three times a day (TID) | ORAL | Status: DC
  Administered 2023-04-29 – 2023-04-30 (×4): 237 mL via ORAL

## 2023-04-29 MED ORDER — AMLODIPINE BESYLATE 5 MG PO TABS
5.0000 mg | ORAL_TABLET | Freq: Every day | ORAL | Status: DC
  Administered 2023-04-29 – 2023-05-01 (×3): 5 mg via ORAL
  Filled 2023-04-29 (×3): qty 1

## 2023-04-29 MED ORDER — AMIODARONE IV BOLUS ONLY 150 MG/100ML
150.0000 mg | Freq: Once | INTRAVENOUS | Status: AC
  Administered 2023-04-29: 150 mg via INTRAVENOUS
  Filled 2023-04-29: qty 100

## 2023-04-29 MED ORDER — FUROSEMIDE 40 MG PO TABS
40.0000 mg | ORAL_TABLET | Freq: Every day | ORAL | Status: DC | PRN
  Administered 2023-05-01: 40 mg via ORAL
  Filled 2023-04-29: qty 1

## 2023-04-29 MED ORDER — DAPAGLIFLOZIN PROPANEDIOL 10 MG PO TABS
10.0000 mg | ORAL_TABLET | Freq: Every day | ORAL | Status: DC
  Administered 2023-04-30 – 2023-05-01 (×2): 10 mg via ORAL
  Filled 2023-04-29 (×2): qty 1

## 2023-04-29 MED ORDER — CARVEDILOL 25 MG PO TABS
25.0000 mg | ORAL_TABLET | Freq: Two times a day (BID) | ORAL | Status: DC
  Administered 2023-04-29 – 2023-05-01 (×5): 25 mg via ORAL
  Filled 2023-04-29 (×5): qty 1

## 2023-04-29 NOTE — Progress Notes (Signed)
Initial Nutrition Assessment  DOCUMENTATION CODES:   Non-severe (moderate) malnutrition in context of chronic illness  INTERVENTION:  Ensure Plus High Protein po BID, each supplement provides 350 kcal and 20 grams of protein. MVI  Reiterate importance of diet edu   NUTRITION DIAGNOSIS:   Moderate Malnutrition related to chronic illness as evidenced by mild fat depletion, moderate muscle depletion, severe fat depletion, mild muscle depletion, moderate fat depletion, severe muscle depletion.   GOAL:   Patient will meet greater than or equal to 90% of their needs -ongoing   MONITOR:   PO intake, Labs, I & O's, Supplement acceptance, Weight trends, Skin  REASON FOR ASSESSMENT:   Malnutrition Screening Tool    ASSESSMENT:  47 y.o. male with PMHx including HTN, tobacco and marijuana abuse, and noncompliance taking meds x 1.24yrs presents with worsening SOB and chest pain. Patient c/o dyspnea on exertion, intermittent N/V, L sided chest pain. Patient admitted for hypertensive emergency  S/p R/L heart cath and coronary angiography  S/p CABG x5   Visited patient at bedside who reports he is still eating well. His breakfast got cold this morning so he was unable to eat it which he was upset about. RD brought him an Ensure while he waits for the next meal time.   Plans to d/c tomorrow. No further questions on diet edu. He gave RD a summary of the education to demonstrate the knowledge he has acquired to change his lifestyle at home.   Ensure no longer ordered for patient to get but he reports he has still been getting them. RD to re-order   No reported N/V/D/C   Labs: Glu 111 Meds: dulcolax, colace Protonix, klor-con, NS, aldactone   NUTRITION - FOCUSED PHYSICAL EXAM:  Flowsheet Row Most Recent Value  Orbital Region Moderate depletion  Upper Arm Region Mild depletion  Thoracic and Lumbar Region Mild depletion  Buccal Region Severe depletion  Temple Region Moderate depletion   Clavicle Bone Region Severe depletion  Clavicle and Acromion Bone Region Moderate depletion  Scapular Bone Region Unable to assess  Dorsal Hand Moderate depletion  Patellar Region Mild depletion  Anterior Thigh Region Mild depletion  Posterior Calf Region Mild depletion  Edema (RD Assessment) None  Hair Reviewed  Eyes Reviewed  Mouth Reviewed  Skin Reviewed  Nails Reviewed       Diet Order:   Diet Order             Diet Carb Modified Fluid consistency: Thin; Room service appropriate? Yes  Diet effective now                   EDUCATION NEEDS:   Education needs have been addressed  Skin:  Skin Assessment: Reviewed RN Assessment  Last BM:  4/27   Height:   Ht Readings from Last 1 Encounters:  04/25/23 5\' 10"  (1.778 m)    Weight:   Wt Readings from Last 1 Encounters:  04/29/23 63.6 kg    BMI:  Body mass index is 20.12 kg/m.  Estimated Nutritional Needs:   Kcal:  1900-2100 kcal  Protein:  80-100 g  Fluid:  >/= 2L    Caleb Perkins, RDN, LDN  Clinical Nutrition

## 2023-04-29 NOTE — Progress Notes (Signed)
Pacing wires pulled per order, wires clean dry and intact Vss. Pt instructed to remain on bed rest for 1 hr. BP 123/85   Pulse 74   Temp 98.4 F (36.9 C) (Oral)   Resp 20   Ht 5\' 10"  (1.778 m)   Wt 63.6 kg   SpO2 97%   BMI 20.12 kg/m

## 2023-04-29 NOTE — TOC Progression Note (Signed)
Transition of Care [facility]) - Progression Note    Patient Details  Name: Caleb Perkins MRN: [id number] Date of Birth: 1976-05-07  Transition of [facility]) CM/SW Contact  Graves-Bigelow, Lamar Laundry, RN Phone Number: 04/29/2023, 3:10 PM  Clinical Narrative:  Patient POD-4 CABG. Case Manager met with patient again regarding PCP needs. Patient wants Case manager to arrange an appointment. Patients Medicaid is actually connected to the [facility] in [place]. Case Manager attempted to call the office to schedule an appointment; unable to schedule at this time-office to mail out new patient application forms. Once application is filled out then patient can be seen for services. Case Manager will continue to follow for transition of care needs.     Expected Discharge Plan: Home/Self Care Barriers to Discharge: Continued Medical Work up  Expected Discharge Plan and Services In-house Referral: PCP / Health Connect Discharge Planning Services: CM Consult                                Social Determinants of Health (SDOH) Interventions SDOH Screenings   Food Insecurity: No Food Insecurity (04/19/2023)  Housing: Low Risk  (04/29/2023)  Recent Concern: Housing - Medium Risk (04/19/2023)  Transportation Needs: No Transportation Needs (04/29/2023)  Utilities: Not At Risk (04/19/2023)  Alcohol Screen: Low Risk  (04/29/2023)  Financial Resource Strain: Low Risk  (04/29/2023)  Tobacco Use: Medium Risk (04/29/2023)   Readmission Risk Interventions     No data to display

## 2023-04-29 NOTE — Progress Notes (Signed)
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  Admitted from: Home  Presentation:   Caleb Perkins presented with left sided chest pain, shortness of breath, diaphoretic. Reported hasn't taken his meds in a few years due to no insurance, currently with Insurance. BP 228/156, HR 95, BNP 495, Troponin 78 - 102, urine positive for Tetrahydrocannabinol. CXR with increased interstitial markings, CTA without evidence of acute aortic syndrome, found aged advanced coronary artery calcifications. Heart cath on 4/22 with severe three vessel CAD, CABG performed on 04/26/23.   Patient was educated on the sign and symptoms of heart failure, daily weights, when to call his doctor or go to the ED. Diet/ fluid restrictions, taking all his medications as prescribed and attending all medical appointments. Patient reported to stopping smoking the week before admission. A HF TOC appointment was scheduled for 05/15/2023 @ 12 noon.   ECHO/ LVEF: 35-40%  Clinical Course:  Past Medical History:  Diagnosis Date   Blood transfusion without reported diagnosis    Hypertension      Social History   Socioeconomic History   Marital status: Single    Spouse name: Not on file   Number of children: Not on file   Years of education: Not on file   Highest education level: Not on file  Occupational History   Not on file  Tobacco Use   Smoking status: Every Day    Packs/day: .5    Types: Cigarettes   Smokeless tobacco: Never  Vaping Use   Vaping Use: Never used  Substance and Sexual Activity   Alcohol use: Not Currently   Drug use: Yes    Types: Marijuana   Sexual activity: Not on file  Other Topics Concern   Not on file  Social History Narrative   Not on file   Social Determinants of Health   Financial Resource Strain: Not on file  Food Insecurity: No Food  Insecurity (04/19/2023)   Hunger Vital Sign    Worried About Running Out of Food in the Last Year: Never true    Ran Out of Food in the Last Year: Never true  Transportation Needs: No Transportation Needs (04/19/2023)   PRAPARE - Administrator, Civil Service (Medical): No    Lack of Transportation (Non-Medical): No  Physical Activity: Not on file  Stress: Not on file  Social Connections: Not on file   Education Assessment and Provision:  Detailed education and instructions provided on heart failure disease management including the following:  Signs and symptoms of Heart Failure When to call the physician Importance of daily weights Low sodium diet Fluid restriction Medication management Anticipated future follow-up appointments  Patient education given on each of the above topics.  Patient acknowledges understanding via teach back method and acceptance of all instructions.  Education Materials:  "Living Better With Heart Failure" Booklet, HF zone tool, & Daily Weight Tracker Tool.  Patient has scale at home: Yes Patient has pill box at home: Yes    High Risk Criteria for Readmission and/or Poor Patient Outcomes: Heart failure hospital admissions (last 6 months): 0  No Show rate: 0 Difficult social situation: No, lives with his mother Demonstrates medication adherence: No, due to No insurance in past, currently has Community education officer.  Primary Language: English Literacy level: Reading, writing, and comprehension  Barriers of Care:   New HF / CABG on 04/25/23 Diet/ fluids/ daily  weights   Considerations/Referrals:   Referral made to Heart Failure Pharmacist Stewardship: Yes, TOC pharm for home delivery  Referral made to Heart Failure CSW/NCM TOC: ? Lost job due to hospital stay, lives with his mother, has Aeronautical engineer.  Referral made to Heart & Vascular TOC clinic: Yes, 05/20/2023 @ 12 noon.   Items for Follow-up on DC/TOC: Diet/ fluids/ daily weights New HF /  CABG 4/26, continued education   Rhae Hammock, BSN, RN Heart Failure Print production planner Chat Only

## 2023-04-29 NOTE — Progress Notes (Signed)
Pts HR elevated to 150s, pt given PRN Lopressor and scheduled Coreg, HR down to 130s, TCTS APP notified, Verbal for 150 Mg Amiodarone bolus x1  from [employer]

## 2023-04-29 NOTE — Progress Notes (Addendum)
      [address]       [place] [address]             [phone]      4 Days Post-Op Procedure(s) (LRB): CORONARY ARTERY BYPASS GRAFTING (CABG) X 5 USING LEFT INTERNAL MAMMARY ARTERY AND ENDOSCOPICALLY HARVESTED RIGHT GREATER SAPHENOUS VEIN (N/A) RADIAL ARTERY HARVEST (Left) TRANSESOPHAGEAL ECHOCARDIOGRAM (N/A) Subjective: Patient states he is doing well this morning, he only has some soreness when he wakes up in the morning. Walked once yesterday.  Objective: Vital signs in last 24 hours: Temp:  [98.1 F (36.7 C)-98.6 F (37 C)] 98.3 F (36.8 C) (04/29 0000) Pulse Rate:  [63-75] 73 (04/29 0400) Cardiac Rhythm: Normal sinus rhythm (04/29 0307) Resp:  [13-20] 13 (04/29 0400) BP: (124-161)/(74-120) 140/120 (04/29 0400) SpO2:  [95 %-98 %] 97 % (04/29 0400) Weight:  [63.6 kg] 63.6 kg (04/29 0613)  Hemodynamic parameters for last 24 hours:    Intake/Output from previous day: 04/28 0701 - 04/29 0700 In: 220 [P.O.:220] Out: 3030 [Urine:3030] Intake/Output this shift: No intake/output data recorded.  General appearance: alert, cooperative, and no distress Neurologic: intact Heart: regular rate and rhythm, S1, S2 normal, no murmur, click, rub or gallop Lungs: clear to auscultation bilaterally Abdomen: soft, non-tender; bowel sounds normal; no masses,  no organomegaly Extremities: extremities normal, atraumatic, no cyanosis or edema Wound: Clean and dry without erythema or sign of infection. Left hand neurovascularly intact.  Lab Results: Recent Labs    04/27/23 0318 04/28/23 0715  WBC 11.5* 12.7*  HGB 11.2* 12.5*  HCT 35.1* 36.4*  PLT 100* 133*   BMET:  Recent Labs    04/27/23 0318 04/28/23 0715  NA 134* 135  K 3.9 3.8  CL 103 100  CO2 28 24  GLUCOSE 107* 111*  BUN 13 18  CREATININE 0.79 0.95  CALCIUM 8.7* 9.0    PT/INR: No results for input(s): "LABPROT", "INR" in the last 72 hours. ABG    Component Value Date/Time   PHART 7.280  (L) 04/25/2023 1859   HCO3 20.0 04/25/2023 1859   TCO2 21 (L) 04/25/2023 1859   ACIDBASEDEF 7.0 (H) 04/25/2023 1859   O2SAT 97 04/25/2023 1859   CBG (last 3)  Recent Labs    04/28/23 0816 04/28/23 1130 04/28/23 1551  GLUCAP 145* 98 101*    Assessment/Plan: S/P Procedure(s) (LRB): CORONARY ARTERY BYPASS GRAFTING (CABG) X 5 USING LEFT INTERNAL MAMMARY ARTERY AND ENDOSCOPICALLY HARVESTED RIGHT GREATER SAPHENOUS VEIN (N/A) RADIAL ARTERY HARVEST (Left) TRANSESOPHAGEAL ECHOCARDIOGRAM (N/A)  CV: PAF-on 400mg  BID PO Amiodarone. NSR this AM with some bursts of asymptomatic NSVT and aflutter? HR 70s this AM. On Norvasc 10mg  daily and Imdur 30mg  daily for radial artery graft. Hypertension, on Coreg 12.5mg  BID and Entresto 24-26mg  BID. SBP 140s. Will titrate Coreg. D/C EPW. Start Plavix for NSTEMI once EPW have been d/c'd.  Acute systolic CHF: On Farxiga and Entresto. Cardiology following.  Pulm: Saturating well on RA. Ambulating well without assistance. Continue IS and ambulation.  GI: +BM, good appetite  Endo: No hx of DM, SSI and CBGs d/c'd.  Renal: UO 3030cc/24hrs. Last Cr 0.95. Under preop weight. Transition to Lasix PRN  Expected postop ABLA: Stable H/H 12.5/36.4  ID: Leukocytosis, last WBC 12.7. Afebrile, likely reactive. Will monitor.   DVT Prophylaxis: Lovenox  Dispo: D/C EPW. Discharge to home later today vs tomorrow AM.    LOS: 10 days    Jenny Reichmann, PA-C 04/29/2023

## 2023-04-29 NOTE — Progress Notes (Addendum)
   Heart Failure Stewardship Pharmacist Progress Note   PCP: Patient, No Pcp Per PCP-Cardiologist: Dina Rich, MD    HPI:  47 yo M with PMH of HTN and tobacco use. Hasn't been taking medications for a few years due to lack of insurance and homelessness. He states he has a residence now and was able to obtain Medicaid.   Presented to the ED on 4/19 with chest pain, shortness of breath, and diaphoresis. Found to be hypertensive with initial BP 228/256, BNP elevated 495, and troponin 78. CXR with worsening chronic lung disease. CTA without evidence of acute aortic syndrome. Also found age advanced coronary artery calcifications. ECHO 4/19 showed LVEF 35-40%, global hypokinesis, moderate LVH, G2DD, RV normal.   R/LHC on 4/22 showed severe three vessel CAD and normal filling pressures with preserved cardiac output. Evaluated by TCTS  and CABG done 4/25.   Current HF Medications: Beta Blocker: carvedilol 25 mg BID ACE/ARB/ARNI: Entresto 24/26 mg BID MRA: spironolactone 25 mg daily SGLT2i: Farxiga 5 mg daily  Prior to admission HF Medications: None  Pertinent Lab Values: Serum creatinine 0.95, BUN 18, Potassium 3.8, Sodium 135, BNP 495, Magnesium 2.3, A1c 5.2   Vital Signs: Weight: 140 lbs (admission weight: 146 lbs) Blood pressure: 140/90s  Heart rate: 70-80s  I/O: -3.1L yesterday; net -7.7L  Medication Assistance / Insurance Benefits Check: Does the patient have prescription insurance?  Yes Type of insurance plan: [place] Medicaid  Outpatient Pharmacy:  Prior to admission outpatient pharmacy: [facility] Is the patient willing to use [facility] TOC pharmacy at discharge? Yes Is the patient willing to transition their outpatient pharmacy to utilize a [facility] outpatient pharmacy?   Yes - transitioned to [facility] mail order    Assessment: 1. Acute systolic CHF (LVEF 35-40%), due to ICM s/p 5 vessel CABG. NYHA class II symptoms. - Not volume overloaded on exam, no indication for diuretics -  Continue carvedilol 25 mg BID - Continue Entresto 24/26 mg BID - Agree with adding spironolactone 25 mg daily - On Farxiga 5 mg daily, consider increasing to 10 mg dose for HF - Can consider stopping amlodipine to allow for Entresto optimization   Plan: 1) Medication changes recommended at this time: - Increase Farxiga to 10 mg daily  2) Patient assistance: - Sherryll Burger copay $4 - Farxiga requires prior authorization - will be completed today   3)  Education  - Patient has been educated on current HF medications and potential additions to HF medication regimen - Patient verbalizes understanding that over the next few months, these medication doses may change and more medications may be added to optimize HF regimen - Patient has been educated on basic disease state pathophysiology and goals of therapy   Sharen Hones, PharmD, BCPS Heart Failure Engineer, building services Phone [phone]

## 2023-04-29 NOTE — Progress Notes (Signed)
Progress Note  Patient Name: Caleb Perkins Date of Encounter: 04/29/2023  Primary Cardiologist:   Dina Rich, MD   Subjective   Feels well.  Minimal pain.  Ambulated in the hallway.  No SOB.   Inpatient Medications    Scheduled Meds:  acetaminophen  1,000 mg Oral Q6H   Or   acetaminophen (TYLENOL) oral liquid 160 mg/5 mL  1,000 mg Per Tube Q6H   amiodarone  400 mg Oral BID   amLODipine  10 mg Oral Daily   aspirin EC  325 mg Oral Daily   Or   aspirin  324 mg Per Tube Daily   atorvastatin  40 mg Oral Daily   bisacodyl  10 mg Oral Daily   Or   bisacodyl  10 mg Rectal Daily   carvedilol  25 mg Oral BID WC   Chlorhexidine Gluconate Cloth  6 each Topical Daily   dapagliflozin propanediol  5 mg Oral Daily   docusate sodium  200 mg Oral Daily   enoxaparin (LOVENOX) injection  40 mg Subcutaneous QHS   isosorbide mononitrate  30 mg Oral Daily   pantoprazole  40 mg Oral Daily   potassium chloride  20 mEq Oral Daily   sacubitril-valsartan  1 tablet Oral BID   sodium chloride flush  3 mL Intravenous Q12H   Continuous Infusions:  sodium chloride Stopped (04/26/23 1618)   sodium chloride     sodium chloride 10 mL/hr at 04/27/23 0800   lactated ringers     lactated ringers     lactated ringers Stopped (04/26/23 1620)   PRN Meds: sodium chloride, dextrose, furosemide, hydrALAZINE, lactated ringers, metoprolol tartrate, midazolam, ondansetron (ZOFRAN) IV, mouth rinse, oxyCODONE, sodium chloride flush, traMADol   Vital Signs    Vitals:   04/29/23 0000 04/29/23 0400 04/29/23 0613 04/29/23 0821  BP: (!) 140/90 (!) 140/120  (!) 137/98  Pulse: 73 73  81  Resp: 18 13  18   Temp: 98.3 F (36.8 C)   98.4 F (36.9 C)  TempSrc: Oral   Oral  SpO2: 98% 97%  96%  Weight:   63.6 kg   Height:        Intake/Output Summary (Last 24 hours) at 04/29/2023 0834 Last data filed at 04/29/2023 4540 Gross per 24 hour  Intake 580 ml  Output 3430 ml  Net -2850 ml   Filed Weights    04/27/23 0500 04/28/23 0500 04/29/23 0613  Weight: 67.2 kg 66.6 kg 63.6 kg    Telemetry    NA - Personally Reviewed  ECG     - Personally Reviewed  Physical Exam   GEN: No  acute distress.   Neck: No  JVD Cardiac: RRR, no murmurs, rubs, or gallops.  Respiratory: Clear   to auscultation bilaterally. GI: Soft, nontender, non-distended, normal bowel sounds  MS:   edema; No deformity. Neuro:   Nonfocal  Psych: Oriented and appropriate     Labs    Chemistry Recent Labs  Lab 04/23/23 0602 04/24/23 0205 04/25/23 0854 04/26/23 1904 04/27/23 0318 04/28/23 0715  NA 136 138   < > 134* 134* 135  K 4.2 4.1   < > 3.9 3.9 3.8  CL 105 108   < > 100 103 100  CO2 20* 23   < > 26 28 24   GLUCOSE 104* 106*   < > 151* 107* 111*  BUN 17 28*   < > 15 13 18   CREATININE 0.97 1.05   < >  0.86 0.79 0.95  CALCIUM 9.2 9.1   < > 8.6* 8.7* 9.0  PROT 6.8 6.6  --   --   --   --   ALBUMIN 3.7 3.5  --   --   --   --   AST 15 14*  --   --   --   --   ALT 13 13  --   --   --   --   ALKPHOS 49 51  --   --   --   --   BILITOT 1.1 0.9  --   --   --   --   GFRNONAA >60 >60   < > >60 >60 >60  ANIONGAP 11 7   < > 8 3* 11   < > = values in this interval not displayed.     Hematology Recent Labs  Lab 04/26/23 1642 04/27/23 0318 04/28/23 0715  WBC 14.4* 11.5* 12.7*  RBC 3.70* 3.59* 3.87*  HGB 11.6* 11.2* 12.5*  HCT 35.2* 35.1* 36.4*  MCV 95.1 97.8 94.1  MCH 31.4 31.2 32.3  MCHC 33.0 31.9 34.3  RDW 12.3 12.4 12.6  PLT 124* 100* 133*    Cardiac EnzymesNo results for input(s): "TROPONINI" in the last 168 hours. No results for input(s): "TROPIPOC" in the last 168 hours.   BNPNo results for input(s): "BNP", "PROBNP" in the last 168 hours.   DDimer No results for input(s): "DDIMER" in the last 168 hours.   Radiology    No results found.  Cardiac Studies   Echo:     1. Left ventricular ejection fraction, by estimation, is 35 to 40%. The  left ventricle has moderately decreased  function. The left ventricle  demonstrates global hypokinesis. There is moderate left ventricular  hypertrophy. Left ventricular diastolic  parameters are consistent with Grade II diastolic dysfunction  (pseudonormalization). Elevated left atrial pressure.   2. Right ventricular systolic function is normal. The right ventricular  size is normal. There is severely elevated pulmonary artery systolic  pressure.   3. Left atrial size was mildly dilated.   4. Right atrial size was mildly dilated.   5. The mitral valve is abnormal. Mild mitral valve regurgitation. No  evidence of mitral stenosis.   6. The tricuspid valve is abnormal.   7. The aortic valve is tricuspid. Aortic valve regurgitation is mild. No  aortic stenosis is present.   8. Aortic dilatation noted. There is mild dilatation of the ascending  aorta, measuring 40 mm.   9. The inferior vena cava is dilated in size with <50% respiratory  variability, suggesting right atrial pressure of 15 mmHg.   Cath  Diagnostic Dominance: Right     Patient Profile     47 y.o. male with hx of HTN ("for most of my life"/on and off medication), tobacco abuse, THC use, prior homelessness (now has a residence) admitted with hypertensive emergency and acute CHF, found to have systolic dysfunction.   Assessment & Plan    CAD:  NSTEMI.  Status post CABG. Good recovery.  Is to be discharged tomorrow.    ISCHEMIC CM:  Acute systolic CHF.  Continue current care and added [place] and changed to [place].  EF is 35 -0 40%.  Will add spironolactone and would over time increase Entresto and stop Norvasc.   I will reduce Norvasc today.  We will arrange close follow up in [place].      Atrial fib:  Still with brief runs of PAF with  rapid rate.  I would suggest 400 amiodarone x 2 weeks the 200 mg daily.  Likely will not need long term.    For questions or updates, please contact [facility] Please consult [email or web address] for contact info under  Cardiology/STEMI.   Signed, Rollene Rotunda, MD  04/29/2023, 8:34 AM

## 2023-04-29 NOTE — Telephone Encounter (Signed)
Patient Advocate Encounter   Received notification that prior authorization for Farxiga 10MG  tablets is required.   PA submitted on 04/29/2023 [place] [employer] [facility] Standard Drug Request Form Status is pending       Roland Earl, CPhT Pharmacy Patient Advocate Specialist [facility] Patient Advocate Team Direct Number: [phone]  Fax: [phone]

## 2023-04-29 NOTE — Progress Notes (Signed)
Follow up with cardiology arranged on 05/14/23, see AVS

## 2023-04-29 NOTE — Telephone Encounter (Signed)
Patient Advocate Encounter  Prior Authorization for Farxiga 10MG  tablets  has been approved.    PA# [id number] Insurance [employer] [facility] Standard Drug Request Form  Effective dates: 04/29/2023 through 04/28/2024  Patients co-pay is $4.00.     Roland Earl, CPhT Pharmacy Patient Advocate Specialist [facility] Patient Advocate Team Direct Number: [phone]  Fax: [phone]

## 2023-04-29 NOTE — Progress Notes (Signed)
CARDIAC REHAB PHASE I   Pt resting in bed, feeling well today. Pt reports walking in hall with 4E staff this morning. Tolerating well with no SOB, dizziness, or pain. Pt ambulating independently, does not need walker. Post OHS education including site care, restrictions, sternal precautions, heart healthy diet, exercise guidelines, IS use at home, home needs at discharge smoking cessation and CRP2. All questions and concerns addressed. Will refer to AP for CRP2. Will continue to follow.   [id number]  Woodroe Chen, RN BSN 04/29/2023 10:09 AM

## 2023-04-30 DIAGNOSIS — I161 Hypertensive emergency: Secondary | ICD-10-CM | POA: Diagnosis not present

## 2023-04-30 LAB — CBC
HCT: 39.2 % (ref 39.0–52.0)
Hemoglobin: 13 g/dL (ref 13.0–17.0)
MCH: 31.3 pg (ref 26.0–34.0)
MCHC: 33.2 g/dL (ref 30.0–36.0)
MCV: 94.2 fL (ref 80.0–100.0)
Platelets: 242 10*3/uL (ref 150–400)
RBC: 4.16 MIL/uL — ABNORMAL LOW (ref 4.22–5.81)
RDW: 12.3 % (ref 11.5–15.5)
WBC: 10.3 10*3/uL (ref 4.0–10.5)
nRBC: 0 % (ref 0.0–0.2)

## 2023-04-30 LAB — BASIC METABOLIC PANEL
Anion gap: 10 (ref 5–15)
BUN: 24 mg/dL — ABNORMAL HIGH (ref 6–20)
CO2: 24 mmol/L (ref 22–32)
Calcium: 9.1 mg/dL (ref 8.9–10.3)
Chloride: 102 mmol/L (ref 98–111)
Creatinine, Ser: 1.05 mg/dL (ref 0.61–1.24)
GFR, Estimated: >60 mL/min (ref >60)
Glucose, Bld: 99 mg/dL (ref 70–99)
Potassium: 3.8 mmol/L (ref 3.5–5.1)
Sodium: 136 mmol/L (ref 135–145)

## 2023-04-30 MED ORDER — ASPIRIN 81 MG PO TBEC
81.0000 mg | DELAYED_RELEASE_TABLET | Freq: Every day | ORAL | Status: DC
  Administered 2023-04-30 – 2023-05-01 (×2): 81 mg via ORAL
  Filled 2023-04-30 (×3): qty 1

## 2023-04-30 MED ORDER — ALPRAZOLAM 0.5 MG PO TABS
0.5000 mg | ORAL_TABLET | Freq: Two times a day (BID) | ORAL | Status: DC | PRN
  Administered 2023-04-30: 0.5 mg via ORAL
  Filled 2023-04-30: qty 1

## 2023-04-30 MED ORDER — SACUBITRIL-VALSARTAN 49-51 MG PO TABS
1.0000 | ORAL_TABLET | Freq: Two times a day (BID) | ORAL | Status: DC
  Administered 2023-04-30 – 2023-05-01 (×2): 1 via ORAL
  Filled 2023-04-30 (×2): qty 1

## 2023-04-30 MED ORDER — POTASSIUM CHLORIDE CRYS ER 20 MEQ PO TBCR
40.0000 meq | EXTENDED_RELEASE_TABLET | Freq: Two times a day (BID) | ORAL | Status: AC
  Administered 2023-04-30 (×2): 40 meq via ORAL
  Filled 2023-04-30 (×2): qty 2

## 2023-04-30 MED ORDER — APIXABAN 5 MG PO TABS
5.0000 mg | ORAL_TABLET | Freq: Two times a day (BID) | ORAL | Status: DC
  Administered 2023-04-30 – 2023-05-01 (×3): 5 mg via ORAL
  Filled 2023-04-30 (×3): qty 1

## 2023-04-30 MED FILL — Potassium Chloride Inj 2 mEq/ML: INTRAVENOUS | Qty: 40 | Status: AC

## 2023-04-30 MED FILL — Magnesium Sulfate Inj 50%: INTRAMUSCULAR | Qty: 10 | Status: AC

## 2023-04-30 MED FILL — Heparin Sodium (Porcine) Inj 1000 Unit/ML: Qty: 1000 | Status: AC

## 2023-04-30 NOTE — Progress Notes (Addendum)
[address]       [place] [address]             [phone]      5 Days Post-Op Procedure(s) (LRB): CORONARY ARTERY BYPASS GRAFTING (CABG) X 5 USING LEFT INTERNAL MAMMARY ARTERY AND ENDOSCOPICALLY HARVESTED RIGHT GREATER SAPHENOUS VEIN (N/A) RADIAL ARTERY HARVEST (Left) TRANSESOPHAGEAL ECHOCARDIOGRAM (N/A) Subjective: Pt states he believes he went into afib because he got upset since he did not receive his meal and bath on time. He states he has anxiety and used to be on anti-anxiety medication.  Objective: Vital signs in last 24 hours: Temp:  [97.9 F (36.6 C)-98.4 F (36.9 C)] 98.1 F (36.7 C) (04/29 2352) Pulse Rate:  [66-81] 76 (04/30 0648) Cardiac Rhythm: Normal sinus rhythm (04/30 0340) Resp:  [17-25] 20 (04/30 0648) BP: (107-150)/(79-107) 129/89 (04/30 0648) SpO2:  [94 %-99 %] 94 % (04/30 0648) Weight:  [62.4 kg] 62.4 kg (04/30 0534)  Hemodynamic parameters for last 24 hours:    Intake/Output from previous day: 04/29 0701 - 04/30 0700 In: 810 [P.O.:810] Out: 1200 [Urine:1200] Intake/Output this shift: No intake/output data recorded.  General appearance: alert, cooperative, and no distress Neurologic: intact Heart: Regular rate and rhythm, no murmur Lungs: clear to auscultation bilaterally Abdomen: soft, non-tender; bowel sounds normal; no masses,  no organomegaly Extremities: extremities normal, atraumatic, no cyanosis or edema Wound: Clean and dry incisions without erythema or infection  Lab Results: Recent Labs    04/28/23 0715 04/30/23 0107  WBC 12.7* 10.3  HGB 12.5* 13.0  HCT 36.4* 39.2  PLT 133* 242   BMET:  Recent Labs    04/28/23 0715 04/30/23 0107  NA 135 136  K 3.8 3.8  CL 100 102  CO2 24 24  GLUCOSE 111* 99  BUN 18 24*  CREATININE 0.95 1.05  CALCIUM 9.0 9.1    PT/INR: No results for input(s): "LABPROT", "INR" in the last 72 hours. ABG    Component Value Date/Time   PHART 7.280 (L) 04/25/2023 1859    HCO3 20.0 04/25/2023 1859   TCO2 21 (L) 04/25/2023 1859   ACIDBASEDEF 7.0 (H) 04/25/2023 1859   O2SAT 97 04/25/2023 1859   CBG (last 3)  Recent Labs    04/28/23 0816 04/28/23 1130 04/28/23 1551  GLUCAP 145* 98 101*    Assessment/Plan: S/P Procedure(s) (LRB): CORONARY ARTERY BYPASS GRAFTING (CABG) X 5 USING LEFT INTERNAL MAMMARY ARTERY AND ENDOSCOPICALLY HARVESTED RIGHT GREATER SAPHENOUS VEIN (N/A) RADIAL ARTERY HARVEST (Left) TRANSESOPHAGEAL ECHOCARDIOGRAM (N/A)  Reported history of anxiety: Consult TOC for PCP needs   CV: PAF-on 400mg  BID PO Amiodarone. NSR this AM, HR 70s. Another run of afib with RVR last evening with HR 130s-150s, resolved with Lopressor, Coreg and bolus of Amiodarone. Start NOAC due to afib instead of Plavix for NSTEMI?  Hypertension: Improved, on Coreg 25mg  BID and Entresto 24-26mg  BID, Norvasc 10mg  daily and Imdur 30mg  daily for radial artery graft.   Acute systolic CHF: On Farxiga, Entresto and Spironolactone. Cardiology following.   Pulm: Saturating well on RA. Ambulating well without assistance. Continue IS and ambulation.   GI: +BM, good appetite   Renal: UO 1200cc/24hrs. Last Cr 1.05. Under preop weight. Lasix PRN and Spironolactone   Expected postop ABLA: Stable H/H 13/39.2   ID: Leukocytosis resolved   DVT Prophylaxis: Lovenox   Dispo: Monitor heart rate and rhythm today, if remains stable d/c tomorrow    LOS: 11 days    Oren Bracket  Stehler, PA-C 04/30/2023  Patient seen and examined, agree with above Below preop weight Supplement K Continue amiodarone and Coreg  Viviann Spare C. Dorris Fetch, MD [facility] [phone]

## 2023-04-30 NOTE — Progress Notes (Signed)
\   Progress Note  Patient Name: Caleb Perkins Date of Encounter: 04/30/2023  Primary Cardiologist:   Dina Rich, MD   Subjective   Feels palpitations when they happen.  No new chest pain.Marland Kitchen  PAF last night.  He does have cough productive of some green/grey sputum   Inpatient Medications    Scheduled Meds:  acetaminophen  1,000 mg Oral Q6H   Or   acetaminophen (TYLENOL) oral liquid 160 mg/5 mL  1,000 mg Per Tube Q6H   amiodarone  400 mg Oral BID   amLODipine  5 mg Oral Daily   apixaban  5 mg Oral BID   aspirin EC  81 mg Oral Daily   atorvastatin  40 mg Oral Daily   bisacodyl  10 mg Oral Daily   Or   bisacodyl  10 mg Rectal Daily   carvedilol  25 mg Oral BID WC   Chlorhexidine Gluconate Cloth  6 each Topical Daily   dapagliflozin propanediol  10 mg Oral Daily   docusate sodium  200 mg Oral Daily   feeding supplement  237 mL Oral TID BM   isosorbide mononitrate  30 mg Oral Daily   pantoprazole  40 mg Oral Daily   potassium chloride  40 mEq Oral BID   sacubitril-valsartan  1 tablet Oral BID   sodium chloride flush  3 mL Intravenous Q12H   spironolactone  25 mg Oral Daily   Continuous Infusions:  sodium chloride Stopped (04/26/23 1618)   sodium chloride     sodium chloride 10 mL/hr at 04/27/23 0800   lactated ringers     lactated ringers     lactated ringers Stopped (04/26/23 1620)   PRN Meds: sodium chloride, ALPRAZolam, dextrose, furosemide, hydrALAZINE, lactated ringers, melatonin, metoprolol tartrate, ondansetron (ZOFRAN) IV, mouth rinse, oxyCODONE, sodium chloride flush, traMADol   Vital Signs    Vitals:   04/30/23 0648 04/30/23 0747 04/30/23 0800 04/30/23 1000  BP: 129/89 (!) 149/113 (!) 158/106 (!) 189/131  Pulse: 76 84  (!) 122  Resp: 20 20 19 17   Temp:  98.5 F (36.9 C)    TempSrc:  Oral    SpO2: 94% 94%  94%  Weight:      Height:        Intake/Output Summary (Last 24 hours) at 04/30/2023 1051 Last data filed at 04/30/2023 0541 Gross per  24 hour  Intake 450 ml  Output 800 ml  Net -350 ml   Filed Weights   04/28/23 0500 04/29/23 0613 04/30/23 0534  Weight: 66.6 kg 63.6 kg 62.4 kg    Telemetry    NSR, PAF - Personally Reviewed  ECG    Atrial fib with RVR 115, LVH, anterolateral T wave changes consistent with ischemia.  Not significantly changed from previous.   Previous EKG was NSR.   - Personally Reviewed   Physical Exam   GEN: No  acute distress.   Neck: No  JVD Cardiac: RRR, no murmurs, rubs, or gallops.  Respiratory:     Decreased breath sounds at the right bases.  GI: Soft, nontender, non-distended, normal bowel sounds  MS:  No edema; No deformity. Neuro:   Nonfocal  Psych: Oriented and appropriate       Labs    Chemistry Recent Labs  Lab 04/24/23 0205 04/25/23 0854 04/27/23 0318 04/28/23 0715 04/30/23 0107  NA 138   < > 134* 135 136  K 4.1   < > 3.9 3.8 3.8  CL 108   < >  103 100 102  CO2 23   < > 28 24 24   GLUCOSE 106*   < > 107* 111* 99  BUN 28*   < > 13 18 24*  CREATININE 1.05   < > 0.79 0.95 1.05  CALCIUM 9.1   < > 8.7* 9.0 9.1  PROT 6.6  --   --   --   --   ALBUMIN 3.5  --   --   --   --   AST 14*  --   --   --   --   ALT 13  --   --   --   --   ALKPHOS 51  --   --   --   --   BILITOT 0.9  --   --   --   --   GFRNONAA >60   < > >60 >60 >60  ANIONGAP 7   < > 3* 11 10   < > = values in this interval not displayed.     Hematology Recent Labs  Lab 04/27/23 0318 04/28/23 0715 04/30/23 0107  WBC 11.5* 12.7* 10.3  RBC 3.59* 3.87* 4.16*  HGB 11.2* 12.5* 13.0  HCT 35.1* 36.4* 39.2  MCV 97.8 94.1 94.2  MCH 31.2 32.3 31.3  MCHC 31.9 34.3 33.2  RDW 12.4 12.6 12.3  PLT 100* 133* 242    Cardiac EnzymesNo results for input(s): "TROPONINI" in the last 168 hours. No results for input(s): "TROPIPOC" in the last 168 hours.   BNPNo results for input(s): "BNP", "PROBNP" in the last 168 hours.   DDimer No results for input(s): "DDIMER" in the last 168 hours.   Radiology    No  results found.  Cardiac Studies   Echo:     1. Left ventricular ejection fraction, by estimation, is 35 to 40%. The  left ventricle has moderately decreased function. The left ventricle  demonstrates global hypokinesis. There is moderate left ventricular  hypertrophy. Left ventricular diastolic  parameters are consistent with Grade II diastolic dysfunction  (pseudonormalization). Elevated left atrial pressure.   2. Right ventricular systolic function is normal. The right ventricular  size is normal. There is severely elevated pulmonary artery systolic  pressure.   3. Left atrial size was mildly dilated.   4. Right atrial size was mildly dilated.   5. The mitral valve is abnormal. Mild mitral valve regurgitation. No  evidence of mitral stenosis.   6. The tricuspid valve is abnormal.   7. The aortic valve is tricuspid. Aortic valve regurgitation is mild. No  aortic stenosis is present.   8. Aortic dilatation noted. There is mild dilatation of the ascending  aorta, measuring 40 mm.   9. The inferior vena cava is dilated in size with <50% respiratory  variability, suggesting right atrial pressure of 15 mmHg.   Cath  Diagnostic Dominance: Right     Patient Profile     47 y.o. male with hx of HTN ("for most of my life"/on and off medication), tobacco abuse, THC use, prior homelessness (now has a residence) admitted with hypertensive emergency and acute CHF, found to have systolic dysfunction.   Assessment & Plan    CAD:  NSTEMI.  Status post CABG. Encouraged continued pulmonary toilet with cough and sputum production.   ISCHEMIC CM:  Acute systolic CHF.  Beta blocker increased yesterday.  I will increase Entresto today.  Given HTN continue current dose on Norvasc as well.    Atrial fib:  Still with brief  runs of PAF with rapid rate.  I would suggest 400 amiodarone x 2 weeks the 200 mg daily.   Mr. WHEELER INCORVAIA has a CHA2DS2 - VASc score of 3.   Likely prudent to use DOAC and  stop Plavix at least for the short term while he is having continued paroxysms of atrial fib.       For questions or updates, please contact [facility] Please consult [email or web address] for contact info under Cardiology/STEMI.   Signed, Rollene Rotunda, MD  04/30/2023, 10:51 AM

## 2023-04-30 NOTE — Progress Notes (Signed)
CARDIAC REHAB PHASE I    Pt in bed, reports feeling anxiety and frustration regarding delays in bath, meals and having to stay in hospital today. States he has had no rest all night and anxiety all night and morning. Offered walk with pt, declined for now. Able to spend time allowing pt to vent frustrations and his ongoing problems with anxiety. Encouraged pt try to get some rest this morning. Blood pressure has been elevated this morning as well. RN aware of continued increase in blood pressure. Pt reported wanting to smoke, reviewed smoking cessation education. Will return to offer walk later today as time permits.   [id number]  Woodroe Chen, RN BSN 04/30/2023 10:15 AM

## 2023-04-30 NOTE — Progress Notes (Signed)
   Heart Failure Stewardship Pharmacist Progress Note   PCP: Patient, No Pcp Per PCP-Cardiologist: Dina Rich, MD    HPI:  47 yo M with PMH of HTN and tobacco use. Hasn't been taking medications for a few years due to lack of insurance and homelessness. He states he has a residence now and was able to obtain Medicaid.   Presented to the ED on 4/19 with chest pain, shortness of breath, and diaphoresis. Found to be hypertensive with initial BP 228/256, BNP elevated 495, and troponin 78. CXR with worsening chronic lung disease. CTA without evidence of acute aortic syndrome. Also found age advanced coronary artery calcifications. ECHO 4/19 showed LVEF 35-40%, global hypokinesis, moderate LVH, G2DD, RV normal.   R/LHC on 4/22 showed severe three vessel CAD and normal filling pressures with preserved cardiac output. Evaluated by TCTS  and CABG done 4/25.   Current HF Medications: Beta Blocker: carvedilol 25 mg BID ACE/ARB/ARNI: Entresto 49/51 mg BID MRA: spironolactone 25 mg daily SGLT2i: Farxiga 10 mg daily  Prior to admission HF Medications: None  Pertinent Lab Values: Serum creatinine 1.05, BUN 24, Potassium 3.8, Sodium 136, BNP 495, Magnesium 2.3, A1c 5.2   Vital Signs: Weight: 137 lbs (admission weight: 146 lbs) Blood pressure: 130-180/100s  Heart rate: 70-80s  I/O: -0.8L yesterday; net -8.2L  Medication Assistance / Insurance Benefits Check: Does the patient have prescription insurance?  Yes Type of insurance plan: [place] Medicaid  Outpatient Pharmacy:  Prior to admission outpatient pharmacy: [facility] Is the patient willing to use [facility] TOC pharmacy at discharge? Yes Is the patient willing to transition their outpatient pharmacy to utilize a [facility] outpatient pharmacy?   Yes - transitioned to [facility] mail order    Assessment: 1. Acute systolic CHF (LVEF 35-40%), due to ICM s/p 5 vessel CABG. NYHA class II symptoms. - Not volume overloaded on exam, no indication for  diuretics - Continue carvedilol 25 mg BID - Agree with increasing to Entresto 49/51 mg BID - Continue spironolactone 25 mg daily - Continue Farxiga 10 mg daily - Can consider stopping amlodipine to allow for Entresto optimization   Plan: 1) Medication changes recommended at this time: - Agree with changes - Stop amlodipine if BP more controlled prior to discharge  2) Patient assistance: - Entresto copay $4 - Farxiga copay $4   3)  Education  - Patient has been educated on current HF medications and potential additions to HF medication regimen - Patient verbalizes understanding that over the next few months, these medication doses may change and more medications may be added to optimize HF regimen - Patient has been educated on basic disease state pathophysiology and goals of therapy   Sharen Hones, PharmD, BCPS Heart Failure Stewardship Pharmacist Phone [phone]

## 2023-05-01 ENCOUNTER — Other Ambulatory Visit (HOSPITAL_COMMUNITY): Payer: Self-pay

## 2023-05-01 LAB — ECHO INTRAOPERATIVE TEE
AV Mean grad: 2 mmHg
AV Peak grad: 3.6 mmHg
Ao pk vel: 0.95 m/s
Height: 70 in
P 1/2 time: 808 msec
Weight: 2272 oz

## 2023-05-01 MED ORDER — SACUBITRIL-VALSARTAN 49-51 MG PO TABS
1.0000 | ORAL_TABLET | Freq: Two times a day (BID) | ORAL | 1 refills | Status: DC
  Filled 2023-05-01: qty 60, 30d supply, fill #0

## 2023-05-01 MED ORDER — ASPIRIN 81 MG PO TBEC: 81.0000 mg | DELAYED_RELEASE_TABLET | Freq: Every day | ORAL | Status: DC

## 2023-05-01 MED ORDER — OXYCODONE HCL 5 MG PO TABS
5.0000 mg | ORAL_TABLET | Freq: Four times a day (QID) | ORAL | 0 refills | Status: DC | PRN
  Filled 2023-05-01: qty 28, 5d supply, fill #0

## 2023-05-01 MED ORDER — POTASSIUM CHLORIDE CRYS ER 20 MEQ PO TBCR
20.0000 meq | EXTENDED_RELEASE_TABLET | Freq: Every day | ORAL | 1 refills | Status: DC | PRN
  Filled 2023-05-01: qty 30, 30d supply, fill #0

## 2023-05-01 MED ORDER — CARVEDILOL 25 MG PO TABS
25.0000 mg | ORAL_TABLET | Freq: Two times a day (BID) | ORAL | 1 refills | Status: DC
  Filled 2023-05-01: qty 60, 30d supply, fill #0

## 2023-05-01 MED ORDER — FUROSEMIDE 40 MG PO TABS
40.0000 mg | ORAL_TABLET | Freq: Every day | ORAL | 1 refills | Status: DC | PRN
  Filled 2023-05-01: qty 30, 30d supply, fill #0

## 2023-05-01 MED ORDER — DM-GUAIFENESIN ER 30-600 MG PO TB12: 1.0000 | ORAL_TABLET | Freq: Two times a day (BID) | ORAL | Status: DC | PRN

## 2023-05-01 MED ORDER — DAPAGLIFLOZIN PROPANEDIOL 10 MG PO TABS
10.0000 mg | ORAL_TABLET | Freq: Every day | ORAL | 1 refills | Status: DC
  Filled 2023-05-01: qty 30, 30d supply, fill #0

## 2023-05-01 MED ORDER — DM-GUAIFENESIN ER 30-600 MG PO TB12
1.0000 | ORAL_TABLET | Freq: Two times a day (BID) | ORAL | Status: DC
  Administered 2023-05-01: 1 via ORAL
  Filled 2023-05-01: qty 1

## 2023-05-01 MED ORDER — AMIODARONE HCL 200 MG PO TABS
ORAL_TABLET | ORAL | 1 refills | Status: DC
  Filled 2023-05-01: qty 60, 46d supply, fill #0

## 2023-05-01 MED ORDER — ISOSORBIDE MONONITRATE ER 30 MG PO TB24
30.0000 mg | ORAL_TABLET | Freq: Every day | ORAL | 1 refills | Status: DC
  Filled 2023-05-01: qty 30, 30d supply, fill #0

## 2023-05-01 MED ORDER — APIXABAN 5 MG PO TABS
5.0000 mg | ORAL_TABLET | Freq: Two times a day (BID) | ORAL | 1 refills | Status: DC
  Filled 2023-05-01: qty 60, 30d supply, fill #0

## 2023-05-01 MED ORDER — ATORVASTATIN CALCIUM 40 MG PO TABS
40.0000 mg | ORAL_TABLET | Freq: Every day | ORAL | 1 refills | Status: DC
  Filled 2023-05-01: qty 30, 30d supply, fill #0

## 2023-05-01 MED ORDER — SPIRONOLACTONE 25 MG PO TABS
25.0000 mg | ORAL_TABLET | Freq: Every day | ORAL | 1 refills | Status: DC
  Filled 2023-05-01: qty 30, 30d supply, fill #0

## 2023-05-01 NOTE — Progress Notes (Addendum)
[address]       [place] [address]             [phone]      6 Days Post-Op Procedure(s) (LRB): CORONARY ARTERY BYPASS GRAFTING (CABG) X 5 USING LEFT INTERNAL MAMMARY ARTERY AND ENDOSCOPICALLY HARVESTED RIGHT GREATER SAPHENOUS VEIN (N/A) RADIAL ARTERY HARVEST (Left) TRANSESOPHAGEAL ECHOCARDIOGRAM (N/A) Subjective: Pt states he feels like he has something he needs to cough up but hasn't been able to yet. Otherwise no new complaints. Ambulated twice yesterday.  Objective: Vital signs in last 24 hours: Temp:  [97.7 F (36.5 C)-98.5 F (36.9 C)] 97.8 F (36.6 C) (05/01 0303) Pulse Rate:  [64-122] 79 (05/01 0303) Cardiac Rhythm: Normal sinus rhythm (04/30 1900) Resp:  [16-25] 20 (05/01 0303) BP: (88-189)/(58-131) 116/86 (05/01 0303) SpO2:  [93 %-99 %] 93 % (05/01 0303) Weight:  [61.7 kg] 61.7 kg (05/01 0611)  Hemodynamic parameters for last 24 hours:    Intake/Output from previous day: 04/30 0701 - 05/01 0700 In: -  Out: 850 [Urine:850] Intake/Output this shift: No intake/output data recorded.  General appearance: alert, cooperative, and no distress Neurologic: intact Heart: Regular rate and rhythm, no murmur Lungs: Slightly diminished bibasilar Abdomen: soft, non-tender; bowel sounds normal; no masses,  no organomegaly Extremities: extremities normal, atraumatic, no cyanosis or edema Wound: Clean and dry, no erythema or sign of infection. Neurovascularly intact  Lab Results: Recent Labs    04/28/23 0715 04/30/23 0107  WBC 12.7* 10.3  HGB 12.5* 13.0  HCT 36.4* 39.2  PLT 133* 242   BMET:  Recent Labs    04/28/23 0715 04/30/23 0107  NA 135 136  K 3.8 3.8  CL 100 102  CO2 24 24  GLUCOSE 111* 99  BUN 18 24*  CREATININE 0.95 1.05  CALCIUM 9.0 9.1    PT/INR: No results for input(s): "LABPROT", "INR" in the last 72 hours. ABG    Component Value Date/Time   PHART 7.280 (L) 04/25/2023 1859   HCO3 20.0 04/25/2023 1859   TCO2 21  (L) 04/25/2023 1859   ACIDBASEDEF 7.0 (H) 04/25/2023 1859   O2SAT 97 04/25/2023 1859   CBG (last 3)  Recent Labs    04/28/23 0816 04/28/23 1130 04/28/23 1551  GLUCAP 145* 98 101*    Assessment/Plan: S/P Procedure(s) (LRB): CORONARY ARTERY BYPASS GRAFTING (CABG) X 5 USING LEFT INTERNAL MAMMARY ARTERY AND ENDOSCOPICALLY HARVESTED RIGHT GREATER SAPHENOUS VEIN (N/A) RADIAL ARTERY HARVEST (Left) TRANSESOPHAGEAL ECHOCARDIOGRAM (N/A)  Reported history of anxiety: TOC assisted with setting up PCP. Xanax PRN while in the hospital.    CV: PAF-on 400mg  BID PO Amiodarone. NSR this AM, HR 70-80s. Eliquis started yesterday. About a 3-4 beat run of asx NSVT last night, otherwise rate and rhythm have remained stable.    Hypertension: Labile BP, on Coreg 25mg  BID, Entresto 49-51mg  BID, Norvasc 5mg  daily and Imdur 30mg  daily for radial artery graft.   Acute systolic CHF: On Farxiga, Entresto and Spironolactone. Appreciated cardiology's assistance.   Pulm: Saturating well on RA. Cough this AM with some sputum production, will give Mucinex to help loosen mucus. Last CXR with bibasilar atelectasis, encouraged IS and ambulation. Hx of COPD, continue inhalers at discharge.    GI: +BM, good appetite   Renal: UO 850cc/24hrs. Last Cr 1.05. Under preop weight. Lasix PRN and Spironolactone   Expected postop ABLA: Stable H/H 13/39.2   ID: Leukocytosis resolved, afebrile   DVT Prophylaxis: Eliquis   Dispo: Plan to d/c  home today   LOS: 12 days    Jenny Reichmann, PA-C 05/01/2023

## 2023-05-01 NOTE — Progress Notes (Signed)
CARDIAC REHAB PHASE I   Pt resting in bed, feeling much better today, with decrease anxiety. Plans for discharge today. Reviewed home education. CRP2 referral sent to [facility].   [phone]  Woodroe Chen, RN BSN 05/01/2023 9:15 AM

## 2023-05-01 NOTE — Progress Notes (Signed)
Chest tube sutures removed per order. 

## 2023-05-01 NOTE — Progress Notes (Addendum)
Discharge instructions reviewed with pt. Reinforced no smoking, pt has information on smoking cessation. Reviewed meds with pt at length as he asked questions and wanted to make sure he was taking them correctly and timely.  Also discussed daily weights and to follow instructions when to take his lasix and potassium.  Pt verbalized understanding and able to "teach/tell" back his instructions.   Copy of instructions given to pt. Scripts being filled by [facility] and will be delivered or picked up at the pharmacy on way to discharge lounge. Pt has contacted family to come pick him up.   Pt getting dressed at this time.   Annice Needy, RN SWOT

## 2023-05-01 NOTE — Progress Notes (Signed)
   Heart Failure Stewardship Pharmacist Progress Note   PCP: Patient, No Pcp Per PCP-Cardiologist: Dina Rich, MD    HPI:  47 yo M with PMH of HTN and tobacco use. Hasn't been taking medications for a few years due to lack of insurance and homelessness. He states he has a residence now and was able to obtain Medicaid.   Presented to the ED on 4/19 with chest pain, shortness of breath, and diaphoresis. Found to be hypertensive with initial BP 228/256, BNP elevated 495, and troponin 78. CXR with worsening chronic lung disease. CTA without evidence of acute aortic syndrome. Also found age advanced coronary artery calcifications. ECHO 4/19 showed LVEF 35-40%, global hypokinesis, moderate LVH, G2DD, RV normal.   R/LHC on 4/22 showed severe three vessel CAD and normal filling pressures with preserved cardiac output. Evaluated by TCTS  and CABG done 4/25.   Current HF Medications: Diuretic: furosemide 40 mg daily PRN Beta Blocker: carvedilol 25 mg BID ACE/ARB/ARNI: Entresto 49/51 mg BID MRA: spironolactone 25 mg daily SGLT2i: Farxiga 10 mg daily  Prior to admission HF Medications: None  Pertinent Lab Values: Serum creatinine 1.05, BUN 24, Potassium 3.8, Sodium 136, BNP 495, Magnesium 2.3, A1c 5.2   Vital Signs: Weight: 136 lbs (admission weight: 146 lbs) Blood pressure: 110-140/80s  Heart rate: 70-80s  I/O: -0.3L yesterday; net -8.8L  Medication Assistance / Insurance Benefits Check: Does the patient have prescription insurance?  Yes Type of insurance plan: [place] Medicaid  Outpatient Pharmacy:  Prior to admission outpatient pharmacy: [facility] Is the patient willing to use [facility] TOC pharmacy at discharge? Yes Is the patient willing to transition their outpatient pharmacy to utilize a [facility] outpatient pharmacy?   Yes - transitioned to [facility] mail order    Assessment: 1. Acute systolic CHF (LVEF 35-40%), due to ICM s/p 5 vessel CABG. NYHA class II symptoms. - Agree with  furosemide 40 mg daily PRN at discharge - Continue carvedilol 25 mg BID - Continue Entresto 49/51 mg BID - Continue spironolactone 25 mg daily - Continue Farxiga 10 mg daily - Agree with stopping amlodipine   Plan: 1) Medication changes recommended at this time: - Agree with changes - Discharge today  2) Patient assistance: - Sherryll Burger copay $4 - Farxiga copay $4   3)  Education  - Patient has been educated on current HF medications and potential additions to HF medication regimen - Patient verbalizes understanding that over the next few months, these medication doses may change and more medications may be added to optimize HF regimen - Patient has been educated on basic disease state pathophysiology and goals of therapy   Sharen Hones, PharmD, BCPS Heart Failure Stewardship Pharmacist Phone [phone]

## 2023-05-02 MED FILL — Heparin Sodium (Porcine) Inj 1000 Unit/ML: INTRAMUSCULAR | Qty: 30 | Status: AC

## 2023-05-02 MED FILL — Electrolyte-R (PH 7.4) Solution: INTRAVENOUS | Qty: 3000 | Status: AC

## 2023-05-02 MED FILL — Mannitol IV Soln 20%: INTRAVENOUS | Qty: 500 | Status: AC

## 2023-05-02 MED FILL — Sodium Bicarbonate IV Soln 8.4%: INTRAVENOUS | Qty: 50 | Status: AC

## 2023-05-02 MED FILL — Lidocaine HCl Local Soln Prefilled Syringe 100 MG/5ML (2%): INTRAMUSCULAR | Qty: 5 | Status: AC

## 2023-05-14 ENCOUNTER — Ambulatory Visit: Payer: Medicaid Other | Attending: Nurse Practitioner | Admitting: Nurse Practitioner

## 2023-05-14 ENCOUNTER — Encounter: Payer: Self-pay | Admitting: Nurse Practitioner

## 2023-05-14 ENCOUNTER — Telehealth: Payer: Self-pay | Admitting: Nurse Practitioner

## 2023-05-14 VITALS — BP 100/62 | HR 68 | Ht 70.0 in | Wt 140.8 lb

## 2023-05-14 DIAGNOSIS — Z79899 Other long term (current) drug therapy: Secondary | ICD-10-CM

## 2023-05-14 DIAGNOSIS — J449 Chronic obstructive pulmonary disease, unspecified: Secondary | ICD-10-CM

## 2023-05-14 DIAGNOSIS — I255 Ischemic cardiomyopathy: Secondary | ICD-10-CM

## 2023-05-14 DIAGNOSIS — I2510 Atherosclerotic heart disease of native coronary artery without angina pectoris: Secondary | ICD-10-CM

## 2023-05-14 DIAGNOSIS — I10 Essential (primary) hypertension: Secondary | ICD-10-CM

## 2023-05-14 DIAGNOSIS — E782 Mixed hyperlipidemia: Secondary | ICD-10-CM

## 2023-05-14 DIAGNOSIS — I4891 Unspecified atrial fibrillation: Secondary | ICD-10-CM

## 2023-05-14 DIAGNOSIS — R9389 Abnormal findings on diagnostic imaging of other specified body structures: Secondary | ICD-10-CM

## 2023-05-14 DIAGNOSIS — I5022 Chronic systolic (congestive) heart failure: Secondary | ICD-10-CM

## 2023-05-14 DIAGNOSIS — I7121 Aneurysm of the ascending aorta, without rupture: Secondary | ICD-10-CM

## 2023-05-14 DIAGNOSIS — Z951 Presence of aortocoronary bypass graft: Secondary | ICD-10-CM

## 2023-05-14 MED ORDER — SPIRONOLACTONE 25 MG PO TABS: 12.5000 mg | ORAL_TABLET | Freq: Every day | ORAL | 6 refills | Status: DC

## 2023-05-14 NOTE — Patient Instructions (Addendum)
Medication Instructions:  Your physician has recommended you make the following change in your medication:  Decrease spironolactone to 12.5 mg daily Continue other medications the same  Labwork: BMET, CBC in 1 week (05/21/2023)-non-fasting Your physician recommends that you return for a FASTING lipid/liver and Thyroid panel in 6-8 week.. Please do not eat or drink for at least 8 hours when you have this done. You may take your medications that morning with a sip of water. [facility]  Testing/Procedures: Your physician has requested that you have an echocardiogram in 3 months. Echocardiography is a painless test that uses sound waves to create images of your heart. It provides your doctor with information about the size and shape of your heart and how well your heart's chambers and valves are working. This procedure takes approximately one hour. There are no restrictions for this procedure. Please do NOT wear cologne, perfume, aftershave, or lotions (deodorant is allowed). Please arrive 15 minutes prior to your appointment time. Non-Cardiac CT scanning, (CAT scanning), is a noninvasive, special x-ray that produces cross-sectional images of the body using x-rays and a computer. CT scans help physicians diagnose and treat medical conditions. For some CT exams, a contrast material is used to enhance visibility in the area of the body being studied. CT scans provide greater clarity and reveal more details than regular x-ray exams. Chest CT with contrast  Follow-Up: Your physician recommends that you schedule a follow-up appointment in: middle of August 2024  Any Other Special Instructions Will Be Listed Below (If Applicable).  If you need a refill on your cardiac medications before your next appointment, please call your pharmacy.

## 2023-05-14 NOTE — Telephone Encounter (Signed)
Checking percert on the following patient for testing scheduled at [facility].    CT CHEST WITH CONTRAST   08-07-2023

## 2023-05-14 NOTE — Progress Notes (Addendum)
Office Visit    Patient Name: Caleb Perkins Date of Encounter: 05/14/2023  PCP:  Patient, No Pcp Per   [facility]  Cardiologist:  Dina Rich, MD  Advanced Practice Provider:  No care team member to display Electrophysiologist:  None   Chief Complaint    Caleb Perkins is a 47 y.o. male with a hx of CAD, s/p NSTEMI and CABG, hypertension, HLD, tobacco abuse, marijuana abuse, ischemic cardiomyopathy, PAF, COPD, ascending thoracic aortic aneurysm, who presents today for hospital follow-up.  Past Medical History    Past Medical History:  Diagnosis Date   Blood transfusion without reported diagnosis    Hypertension    Past Surgical History:  Procedure Laterality Date   CORONARY ARTERY BYPASS GRAFT N/A 04/25/2023   Procedure: CORONARY ARTERY BYPASS GRAFTING (CABG) X 5 USING LEFT INTERNAL MAMMARY ARTERY AND ENDOSCOPICALLY HARVESTED RIGHT GREATER SAPHENOUS VEIN;  Surgeon: Loreli Slot, MD;  Location: [facility] OR;  Service: Open Heart Surgery;  Laterality: N/A;   RADIAL ARTERY HARVEST Left 04/25/2023   Procedure: RADIAL ARTERY HARVEST;  Surgeon: Loreli Slot, MD;  Location: [facility] OR;  Service: Open Heart Surgery;  Laterality: Left;   RIGHT/LEFT HEART CATH AND CORONARY ANGIOGRAPHY N/A 04/22/2023   Procedure: RIGHT/LEFT HEART CATH AND CORONARY ANGIOGRAPHY;  Surgeon: Yvonne Kendall, MD;  Location: [facility] INVASIVE CV LAB;  Service: Cardiovascular;  Laterality: N/A;   TEE WITHOUT CARDIOVERSION N/A 04/25/2023   Procedure: TRANSESOPHAGEAL ECHOCARDIOGRAM;  Surgeon: Loreli Slot, MD;  Location: [facility] OR;  Service: Open Heart Surgery;  Laterality: N/A;    Allergies  No Known Allergies  History of Present Illness    Caleb Perkins is a 47 y.o. male with a PMH as mentioned above.  Presented to [facility] ED with worsening shortness of breath and chest pain on April 19, 2023.  Also noted some intermittent ankle edema, denies syncope.  Was found to  have hypertensive emergency with blood pressure 228/156.  Troponin maximum 102, BNP 495.  Received IV labetalol.  Cardiology was consulted.  CTA negative for aortic dissection, no noted to have mild dilation of ascending aorta measuring 4.1 cm and intralobular septal thickening in both lungs, also noted to have small right pleural effusion with patchy groundglass opacities in right upper and lower lobes.  Echocardiogram revealed EF 35 to 40%, LV with global hypokinesis, mild AI, MR, and TR, and aortic dilatation of 40 mm.  Underwent cardiac catheterization that revealed 95% stenosis along the ostial to proximal LAD, mid LAD with 85% stenosis, 75% stenosis along mid to distal circumflex, and proximal to distal RCA with 100% stenosis.  Then underwent CABG x 5 utilizing SVG sequential to OM1 and OM 2, LIMA to LAD, SVG to diagonal, and radial artery to PDA.  Tolerated procedure well.  Developed A-fib on postop day 3, started on oral amiodarone loading, also started on Entresto and Farxiga for his systolic heart failure.  Remained in sinus rhythm and had bursts of A-fib with RVR and NSVT.  Converted later on to normal sinus rhythm with bolus of amiodarone, oral amiodarone was continued.  Started on Eliquis for stroke prevention.  Today he presents for hospital follow-up with his mother. He states he is doing very well. Denies any chest pain, shortness of breath, palpitations, syncope, presyncope, orthopnea, PND, swelling or significant weight changes, acute bleeding, or claudication. Does admit to some dizziness occasionally with prolonged hot showers, but denies any orthostatic symptoms. He has stopped smoking  cigarettes.   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  POST-OP IMPRESSIONS  _ Left  Ventricle: LVEF marginally improved, 40-45%. Some improved wall  motion on  the septum.  _ Right Ventricle: The right ventricle appears unchanged from pre-bypass.  _ Aorta: The aorta appears unchanged from pre-bypass.  _ Left Atrial Appendage: The left atrial appendage appears unchanged from  pre-bypass.  _ Mitral Valve: The mitral valve appears unchanged from pre-bypass.  _ Tricuspid Valve: The tricuspid valve appears unchanged from pre-bypass.  _ Pulmonic Valve: The pulmonic valve appears unchanged from pre-bypass.  _ Interatrial Septum: The interatrial septum appears unchanged from  pre-bypass.   PRE-OP FINDINGS   Left Ventricle: The left ventricle has moderately reduced systolic  function, with an ejection fraction of 35-40%. The cavity size was mildly  dilated. There is moderate eccentric left ventricular hypertrophy.  Doppler 04/2023:  Summary:  Right Carotid: Velocities in the right ICA are consistent with a 1-39%  stenosis.   Left Carotid: Velocities in the left ICA are consistent with a 1-39%  stenosis.  Vertebrals: Bilateral vertebral arteries demonstrate antegrade flow.  Subclavians: Normal flow hemodynamics were seen in bilateral subclavian               arteries.   Right ABI: Triphasic PTA, multiphasic DPA.  Left ABI: Triphasic PTA and DPA.    Right Upper Extremity: Doppler waveforms remain within normal limits with  right radial compression. Doppler waveforms remain within normal limits  with right ulnar compression.  Left Upper Extremity: Doppler waveforms remain within normal limits with  left radial compression. Doppler waveforms remain within normal limits  with left ulnar compression.     EKG:  EKG is not ordered today.    Right and left heart cath 04/2023: Conclusions: Severe three-vessel coronary artery disease, as detailed below, including sequential 95% ostial and 80-90% mid LAD stenoses, 60% OM1 lesion, 70-80% mid/distal LCx disease, and chronic total  occlusion of proximal/mid RCA with left-to-right and right-to-right collaterals. Moderately reduced left ventricular systolic function (LVEF 35-45%). Normal left heart, right heart, and pulmonary artery pressures. Normal Fick cardiac output/index.   Recommendations: Gentle post-catheterization hydration; aim for net even fluid balance. Escalate goal directed medical therapy for acute HFrEF; I will add Entresto 24-26 mg BID beginning this evening. Cardiac surgery consultation for CABG. Aggressive secondary prevention of coronary artery disease. Start heparin infusion 2 hours after TR band removal in the setting of severe multivessel CAD and elevated troponin this admission.  Echo 04/2023: 1. Left ventricular ejection fraction, by estimation, is 35 to 40%. The  left ventricle has moderately decreased function. The left ventricle  demonstrates global hypokinesis. There is moderate left ventricular  hypertrophy. Left ventricular diastolic  parameters are consistent with Grade II diastolic dysfunction  (pseudonormalization). Elevated left atrial pressure.   2. Right ventricular systolic function is normal. The right ventricular  size is normal. There is severely elevated pulmonary artery systolic  pressure.   3. Left atrial size was mildly dilated.   4. Right atrial size was mildly dilated.   5. The mitral valve is abnormal. Mild mitral valve regurgitation. No  evidence of mitral stenosis.   6. The tricuspid valve is abnormal.   7. The aortic valve is tricuspid. Aortic valve regurgitation is mild. No  aortic  stenosis is present.   8. Aortic dilatation noted. There is mild dilatation of the ascending  aorta, measuring 40 mm.   9. The inferior vena cava is dilated in size with <50% respiratory  variability, suggesting right atrial pressure of 15 mmHg.    Recent Labs: 04/19/2023: B Natriuretic Peptide 495.0 04/20/2023: TSH 0.963 04/24/2023: ALT 13 04/26/2023: Magnesium 2.3 04/30/2023: BUN  24; Creatinine, Ser 1.05; Hemoglobin 13.0; Platelets 242; Potassium 3.8; Sodium 136  Recent Lipid Panel    Component Value Date/Time   CHOL 181 04/20/2023 0518   TRIG 116 04/20/2023 0518   HDL 35 (L) 04/20/2023 0518   CHOLHDL 5.2 04/20/2023 0518   VLDL 23 04/20/2023 0518   LDLCALC 123 (H) 04/20/2023 0518    Risk Assessment/Calculations:   CHA2DS2-VASc Score = 3  This indicates a 3.2% annual risk of stroke. The patient's score is based upon: CHF History: 1 HTN History: 1 Diabetes History: 0 Stroke History: 0 Vascular Disease History: 1 Age Score: 0 Gender Score: 0   Home Medications   Current Meds  Medication Sig   albuterol (VENTOLIN HFA) 108 (90 Base) MCG/ACT inhaler Inhale 2 puffs into the lungs every 6 (six) hours as needed for wheezing or shortness of breath.   amiodarone (PACERONE) 200 MG tablet Take 2 tablets once per day for 14 days then take 1 tablet once per day thereafter   apixaban (ELIQUIS) 5 MG TABS tablet Take 1 tablet (5 mg total) by mouth 2 (two) times daily.   aspirin EC 81 MG tablet Take 1 tablet (81 mg total) by mouth daily. Swallow whole.   atorvastatin (LIPITOR) 40 MG tablet Take 1 tablet (40 mg total) by mouth daily.   carvedilol (COREG) 25 MG tablet Take 1 tablet (25 mg total) by mouth 2 (two) times daily with a meal.   dapagliflozin propanediol (FARXIGA) 10 MG TABS tablet Take 1 tablet (10 mg total) by mouth daily.   dextromethorphan-guaiFENesin (MUCINEX DM) 30-600 MG 12hr tablet Take 1 tablet by mouth 2 (two) times daily as needed for cough.   Fluticasone-Salmeterol (ADVAIR DISKUS) 250-50 MCG/DOSE AEPB Inhale 1 puff into the lungs 2 (two) times daily.   furosemide (LASIX) 40 MG tablet Take 1 tablet (40 mg total) by mouth daily as needed for edema (Weight gain of 3lbs or more in 24 hours or 5lbs or more in 48 hours).   isosorbide mononitrate (IMDUR) 30 MG 24 hr tablet Take 1 tablet (30 mg total) by mouth daily.   oxyCODONE (OXY IR/ROXICODONE) 5 MG  immediate release tablet Take 1 tablet (5 mg total) by mouth every 6 (six) hours as needed for severe pain.   potassium chloride SA (KLOR-CON M) 20 MEQ tablet Take 1 tablet (20 mEq total) by mouth daily as needed (When you take Lasix).   sacubitril-valsartan (ENTRESTO) 49-51 MG Take 1 tablet by mouth 2 (two) times daily.   spironolactone (ALDACTONE) 25 MG tablet Take 1 tablet (25 mg total) by mouth daily.     Review of Systems    All other systems reviewed and are otherwise negative except as noted above.  Physical Exam    VS:  BP 100/62   Pulse 68   Ht 5\' 10"  (1.778 m)   Wt 140 lb 12.8 oz (63.9 kg)   SpO2 99%   BMI 20.20 kg/m  , BMI Body mass index is 20.2 kg/m.  Wt Readings from Last 3 Encounters:  05/14/23 140 lb 12.8 oz (63.9 kg)  05/01/23 136 lb (61.7  kg)  11/10/19 171 lb (77.6 kg)     GEN: Thin, 47 y.o. male in no acute distress. HEENT: normal. Neck: Supple, no JVD, carotid bruits, or masses. Cardiac: S1/S2, RRR, no murmurs, rubs, or gallops. No clubbing, cyanosis, edema.  Radials/PT 2+ and equal bilaterally.  Respiratory:  Respirations regular and unlabored, clear to auscultation bilaterally. MS: No deformity or atrophy. Skin: Pale appearance, warm and dry, no rash. Incisions healing well, approximated, no complications.  Neuro:  Strength and sensation are intact. Psych: Normal affect.  Assessment & Plan    CAD, s/p NSTEMI and CABG x 5 (04/2023), medication management Stable with no anginal symptoms. Doing very well. Continue current medication regimen. Will obtain CBC and BMET in 1 week for evaluation.  Okay to start cardiac rehab. I have written him a work note. Heart healthy diet and regular cardiovascular exercise encouraged. Smoking cessation/THC cessation encouraged and discussed. Congratulated him that he has quit smoking nicotine.     Cardiac Rehabilitation Eligibility Assessment  The patient is ready to start cardiac rehabilitation from a cardiac  standpoint.   2. Post-op A-fib, on amiodarone therapy Had post-op A-fib after CABG. Currently in NSR on exam. Continue Amiodarone, Coreg, and Eliquis. Currently on appropriate dosage and denies any bleeding issues. At next visit, will see if amiodarone can be stopped if he continues to remain in NSR. Will obtain LFT and thyroid panel in 6-8 weeks per protocol. Continue to follow with PCP and encouraged annual eye exams.   Addendum 06/01/2023: Dr. Wyline Mood recommended, "Would continue ASA and eliquis for now in setting of afib and NSTEMI with CABG."  3. Ischemic cardiomyopathy, HFmrEF TTE pre-CABG revealed EF 35-40%, post CABG TEE revealed EF 40-45%. Euvolemic and well compensated on exam. Continue current medication regimen. Will reduce Aldactone to 12.5 mg daily to improve his dizziness, may be dehydrated when this happens. Discussed conservative measures, denies any orthostatic symptoms. Low sodium diet, fluid restriction <2L, and daily weights encouraged. Educated to contact our office for weight gain of 2 lbs overnight or 5 lbs in one week.  4. HTN BP 100/62. Reducing Aldactone to 12.5 mg daily. Continue rest of medication regimen. Discussed to monitor BP at home at least 2 hours after medications and sitting for 5-10 minutes. Heart healthy diet and regular cardiovascular exercise encouraged.   5. HLD Continue atorvastatin. Will obtain FLP and LFT in 6-8 weeks per protocol. Heart healthy diet and regular cardiovascular exercise encouraged.   5. Ascending thoracic aortic aneurysm CT of chest on 04/19/2023 revealed mild aneurysm dilatation of ascending thoracic aorta measuring 4.1 cm, no evidence of acute aortic syndrome. Discussed results with him. Has quit smoking. Discussed conservative measures and he verbalizes understanding. Recommend repeating CT angio in 1 year for monitoring.   6. Abnormal CT scan CT scanning of chest on 04/19/2023 revealed interlobular septal thickening in both lungs, small  right pleural effusion with patchy groundglass opacities of right upper lobes, consistent with pulmonary interstitial edema and superimposed infection/inflammation.  Was recommended to follow-up with chest CT in 3 months to assess for resolution.  Denies any SHOB. Will arrange this for him.  7. COPD Denies any SHOB or acute exacerbation. No medication changes. Continue to follow with PCP.  Follow up in 3 month(s) with Dina Rich, MD or APP.  Signed, Sharlene Dory, NP 05/14/2023, 10:46 AM [facility]

## 2023-05-15 ENCOUNTER — Encounter (HOSPITAL_COMMUNITY): Payer: Medicaid Other

## 2023-05-16 ENCOUNTER — Ambulatory Visit: Payer: Medicaid Other | Admitting: Nurse Practitioner

## 2023-05-17 ENCOUNTER — Telehealth (HOSPITAL_COMMUNITY): Payer: Self-pay

## 2023-05-17 NOTE — Telephone Encounter (Signed)
Called to confirm Heart & Vascular Transitions of Care appointment at 05/20/23. Patient reminded to bring all medications and pill box organizer with them. Confirmed patient has transportation. Gave directions, instructed to utilize valet parking.  Confirmed appointment prior to ending call.

## 2023-05-20 ENCOUNTER — Other Ambulatory Visit (HOSPITAL_COMMUNITY): Payer: Self-pay

## 2023-05-20 ENCOUNTER — Encounter (HOSPITAL_COMMUNITY): Payer: Self-pay

## 2023-05-20 ENCOUNTER — Ambulatory Visit (HOSPITAL_COMMUNITY)
Admit: 2023-05-20 | Discharge: 2023-05-20 | Disposition: A | Discharge status: Home / Self Care | Payer: Medicaid Other | Attending: Adult Health | Admitting: Adult Health

## 2023-05-20 VITALS — BP 114/76 | HR 71 | Wt 141.6 lb

## 2023-05-20 DIAGNOSIS — Z87891 Personal history of nicotine dependence: Secondary | ICD-10-CM | POA: Insufficient documentation

## 2023-05-20 DIAGNOSIS — Z7951 Long term (current) use of inhaled steroids: Secondary | ICD-10-CM | POA: Diagnosis not present

## 2023-05-20 DIAGNOSIS — I110 Hypertensive heart disease with heart failure: Secondary | ICD-10-CM | POA: Insufficient documentation

## 2023-05-20 DIAGNOSIS — I5022 Chronic systolic (congestive) heart failure: Secondary | ICD-10-CM | POA: Diagnosis not present

## 2023-05-20 DIAGNOSIS — I2510 Atherosclerotic heart disease of native coronary artery without angina pectoris: Secondary | ICD-10-CM | POA: Insufficient documentation

## 2023-05-20 DIAGNOSIS — Z79899 Other long term (current) drug therapy: Secondary | ICD-10-CM | POA: Diagnosis not present

## 2023-05-20 DIAGNOSIS — Z7901 Long term (current) use of anticoagulants: Secondary | ICD-10-CM | POA: Diagnosis not present

## 2023-05-20 DIAGNOSIS — I160 Hypertensive urgency: Secondary | ICD-10-CM | POA: Diagnosis not present

## 2023-05-20 DIAGNOSIS — Z951 Presence of aortocoronary bypass graft: Secondary | ICD-10-CM | POA: Insufficient documentation

## 2023-05-20 DIAGNOSIS — Z7982 Long term (current) use of aspirin: Secondary | ICD-10-CM | POA: Diagnosis not present

## 2023-05-20 DIAGNOSIS — I080 Rheumatic disorders of both mitral and aortic valves: Secondary | ICD-10-CM | POA: Insufficient documentation

## 2023-05-20 DIAGNOSIS — I255 Ischemic cardiomyopathy: Secondary | ICD-10-CM

## 2023-05-20 DIAGNOSIS — I480 Paroxysmal atrial fibrillation: Secondary | ICD-10-CM | POA: Insufficient documentation

## 2023-05-20 DIAGNOSIS — Z7984 Long term (current) use of oral hypoglycemic drugs: Secondary | ICD-10-CM | POA: Diagnosis not present

## 2023-05-20 LAB — BASIC METABOLIC PANEL
Anion gap: 7 (ref 5–15)
BUN: 22 mg/dL — ABNORMAL HIGH (ref 6–20)
CO2: 26 mmol/L (ref 22–32)
Calcium: 9.2 mg/dL (ref 8.9–10.3)
Chloride: 105 mmol/L (ref 98–111)
Creatinine, Ser: 1.14 mg/dL (ref 0.61–1.24)
GFR, Estimated: >60 mL/min (ref >60)
Glucose, Bld: 110 mg/dL — ABNORMAL HIGH (ref 70–99)
Potassium: 4.3 mmol/L (ref 3.5–5.1)
Sodium: 138 mmol/L (ref 135–145)

## 2023-05-20 LAB — CBC
HCT: 39.1 % (ref 39.0–52.0)
Hemoglobin: 12.7 g/dL — ABNORMAL LOW (ref 13.0–17.0)
MCH: 31.5 pg (ref 26.0–34.0)
MCHC: 32.5 g/dL (ref 30.0–36.0)
MCV: 97 fL (ref 80.0–100.0)
Platelets: 226 10*3/uL (ref 150–400)
RBC: 4.03 MIL/uL — ABNORMAL LOW (ref 4.22–5.81)
RDW: 13.2 % (ref 11.5–15.5)
WBC: 8.4 10*3/uL (ref 4.0–10.5)
nRBC: 0 % (ref 0.0–0.2)

## 2023-05-20 LAB — BRAIN NATRIURETIC PEPTIDE: B Natriuretic Peptide: 145.5 pg/mL — ABNORMAL HIGH (ref 0.0–100.0)

## 2023-05-20 MED ORDER — ASPIRIN 81 MG PO TBEC: 81.0000 mg | DELAYED_RELEASE_TABLET | Freq: Every day | ORAL | 11 refills | Status: AC

## 2023-05-20 MED ORDER — SACUBITRIL-VALSARTAN 49-51 MG PO TABS: 1.0000 | ORAL_TABLET | Freq: Two times a day (BID) | ORAL | 11 refills | Status: DC

## 2023-05-20 MED ORDER — ISOSORBIDE MONONITRATE ER 30 MG PO TB24: 30.0000 mg | ORAL_TABLET | Freq: Every day | ORAL | 11 refills | Status: DC

## 2023-05-20 MED ORDER — APIXABAN 5 MG PO TABS: 5.0000 mg | ORAL_TABLET | Freq: Two times a day (BID) | ORAL | 11 refills | Status: DC

## 2023-05-20 MED ORDER — AMIODARONE HCL 200 MG PO TABS: 100.0000 mg | ORAL_TABLET | Freq: Every day | ORAL | 11 refills | Status: DC

## 2023-05-20 MED ORDER — CARVEDILOL 25 MG PO TABS: 25.0000 mg | ORAL_TABLET | Freq: Two times a day (BID) | ORAL | 11 refills | Status: DC

## 2023-05-20 MED ORDER — POTASSIUM CHLORIDE CRYS ER 20 MEQ PO TBCR: 20.0000 meq | EXTENDED_RELEASE_TABLET | Freq: Every day | ORAL | 5 refills | Status: DC | PRN

## 2023-05-20 MED ORDER — DAPAGLIFLOZIN PROPANEDIOL 10 MG PO TABS: 10.0000 mg | ORAL_TABLET | Freq: Every day | ORAL | 11 refills | Status: DC

## 2023-05-20 MED ORDER — SPIRONOLACTONE 25 MG PO TABS: 12.5000 mg | ORAL_TABLET | Freq: Every day | ORAL | 11 refills | Status: DC

## 2023-05-20 MED ORDER — FUROSEMIDE 40 MG PO TABS: 40.0000 mg | ORAL_TABLET | Freq: Every day | ORAL | 11 refills | Status: DC | PRN

## 2023-05-20 MED ORDER — ATORVASTATIN CALCIUM 40 MG PO TABS: 40.0000 mg | ORAL_TABLET | Freq: Every day | ORAL | 11 refills | Status: DC

## 2023-05-20 NOTE — Progress Notes (Signed)
HEART & VASCULAR TRANSITION OF CARE CONSULT NOTE     Referring Physician:Dr Hendrickson  Primary Care: None  Primary Cardiologist: Dr Wyline Mood  CT: Dr Dorris Fetch   HPI: Referred to clinic by Dr Dorris Fetch for heart failure consultation.   Caleb Perkins is a 47 year old with a history A fib, CAD, S/P CABG, chronic HFrEF, and tobacco abuse.   Admitted with chest pain /hypertensive urgency. Elevated HS Trop. Echo showed LVEF 35-40%, LV with global hypokinesis, mild Caleb, mild AI, mild TR, and aortic dilatation 40 mm. Cath showed ostial LAD to proximal LAD lesion is 95% stenosed, mid LAD with 85% stenosis, mid to distal Circumflex with a 75% stenosis, and proximal to distal RCA with 100% stenosis. CT surgery consulted. S/P CABG x5. Post op had NSVT/ AFib RVR. Discharged 05/01/23 .   He was seen by cardiology last week. Having some dizziness. Spironolactone was cut back to 12.5 mg.   Overall feeling fine.  No issues with dizziness. Walking a little more at home. Denies SOB/PND/Orthopnea. Appetite ok. No fever or chills. Weight at home 138-140 pounds. Taking all medications. Has not needed lasix. No smoking. Lives with Mom.    Cardiac Testing  TEE Post-op 04/25/2023:  Complications: No known complications during this procedure.  POST-OP IMPRESSIONS  _ Left Ventricle: LVEF marginally improved, 40-45%. Some improved wall  motion on  the septum.  _ Right Ventricle: The right ventricle appears unchanged from pre-bypass.  _ Aorta: The aorta appears unchanged from pre-bypass.  _ Left Atrial Appendage: The left atrial appendage appears unchanged from  pre-bypass.  _ Mitral Valve: The mitral valve appears unchanged from pre-bypass.  _ Tricuspid Valve: The tricuspid valve appears unchanged from pre-bypass.  _ Pulmonic Valve: The pulmonic valve appears unchanged from pre-bypass.  _ Interatrial Septum: The interatrial septum appears unchanged from  pre-bypass.   Cath  04/2023  Severe three-vessel  coronary artery disease, as detailed below, including sequential 95% ostial and 80-90% mid LAD stenoses, 60% OM1 lesion, 70-80% mid/distal LCx disease, and chronic total occlusion of proximal/mid RCA with left-to-right and right-to-right collaterals. Moderately reduced left ventricular systolic function (LVEF 35-45%). Normal left heart, right heart, and pulmonary artery pressures. Normal Fick cardiac output/index.  Echo 04/19/2023  . Left ventricular ejection fraction, by estimation, is 35 to 40%. The  left ventricle has moderately decreased function. The left ventricle  demonstrates global hypokinesis. There is moderate left ventricular  hypertrophy. Left ventricular diastolic  parameters are consistent with Grade II diastolic dysfunction  (pseudonormalization). Elevated left atrial pressure.   2. Right ventricular systolic function is normal. The right ventricular  size is normal. There is severely elevated pulmonary artery systolic  pressure.   3. Left atrial size was mildly dilated.   4. Right atrial size was mildly dilated.   5. The mitral valve is abnormal. Mild mitral valve regurgitation. No  evidence of mitral stenosis.   6. The tricuspid valve is abnormal.   7. The aortic valve is tricuspid. Aortic valve regurgitation is mild. No  aortic stenosis is present.   8. Aortic dilatation noted. There is mild dilatation of the ascending  aorta, measuring 40 mm.   Review of Systems: [y] = yes, [ ]  = no   General: Weight gain [ ] ; Weight loss [ ] ; Anorexia [ ] ; Fatigue [ ] ; Fever [ ] ; Chills [ ] ; Weakness [ ]   Cardiac: Chest pain/pressure [ ] ; Resting SOB [ ] ; Exertional SOB [ ] ; Orthopnea [ ] ; Pedal Edema [ ] ;  Palpitations [ ] ; Syncope [ ] ; Presyncope [ ] ; Paroxysmal nocturnal dyspnea[ ]   Pulmonary: Cough [ ] ; Wheezing[ ] ; Hemoptysis[ ] ; Sputum [ ] ; Snoring [ ]   GI: Vomiting[ ] ; Dysphagia[ ] ; Melena[ ] ; Hematochezia [ ] ; Heartburn[ ] ; Abdominal pain [ ] ; Constipation [ ] ; Diarrhea [ ] ;  BRBPR [ ]   GU: Hematuria[ ] ; Dysuria [ ] ; Nocturia[ ]   Vascular: Pain in legs with walking [ ] ; Pain in feet with lying flat [ ] ; Non-healing sores [ ] ; Stroke [ ] ; TIA [ ] ; Slurred speech [ ] ;  Neuro: Headaches[ ] ; Vertigo[ ] ; Seizures[ ] ; Paresthesias[ ] ;Blurred vision [ ] ; Diplopia [ ] ; Vision changes [ ]   Ortho/Skin: Arthritis [ ] ; Joint pain [ Y]; Muscle pain [ ] ; Joint swelling [ ] ; Back Pain [ ] ; Rash [ ]   Psych: Depression[ ] ; Anxiety[ ]   Heme: Bleeding problems [ ] ; Clotting disorders [ ] ; Anemia [ ]   Endocrine: Diabetes [ ] ; Thyroid dysfunction[ ]    Past Medical History:  Diagnosis Date   Blood transfusion without reported diagnosis    Hypertension     Current Outpatient Medications  Medication Sig Dispense Refill   albuterol (VENTOLIN HFA) 108 (90 Base) MCG/ACT inhaler Inhale 2 puffs into the lungs every 6 (six) hours as needed for wheezing or shortness of breath. 18 g 2   amiodarone (PACERONE) 200 MG tablet Take 200 mg by mouth daily.     apixaban (ELIQUIS) 5 MG TABS tablet Take 1 tablet (5 mg total) by mouth 2 (two) times daily. 60 tablet 1   aspirin EC 81 MG tablet Take 1 tablet (81 mg total) by mouth daily. Swallow whole.     atorvastatin (LIPITOR) 40 MG tablet Take 1 tablet (40 mg total) by mouth daily. 30 tablet 1   carvedilol (COREG) 25 MG tablet Take 1 tablet (25 mg total) by mouth 2 (two) times daily with a meal. 60 tablet 1   dapagliflozin propanediol (FARXIGA) 10 MG TABS tablet Take 1 tablet (10 mg total) by mouth daily. 30 tablet 1   dextromethorphan-guaiFENesin (MUCINEX DM) 30-600 MG 12hr tablet Take 1 tablet by mouth 2 (two) times daily as needed for cough.     Fluticasone-Salmeterol (ADVAIR DISKUS) 250-50 MCG/DOSE AEPB Inhale 1 puff into the lungs 2 (two) times daily. (Patient taking differently: Inhale 1 puff into the lungs as needed.) 60 each 2   furosemide (LASIX) 40 MG tablet Take 1 tablet (40 mg total) by mouth daily as needed for edema (Weight gain of 3lbs  or more in 24 hours or 5lbs or more in 48 hours). 30 tablet 1   isosorbide mononitrate (IMDUR) 30 MG 24 hr tablet Take 1 tablet (30 mg total) by mouth daily. 30 tablet 1   oxyCODONE (OXY IR/ROXICODONE) 5 MG immediate release tablet Take 1 tablet (5 mg total) by mouth every 6 (six) hours as needed for severe pain. 28 tablet 0   potassium chloride SA (KLOR-CON M) 20 MEQ tablet Take 1 tablet (20 mEq total) by mouth daily as needed (When you take Lasix). 30 tablet 1   sacubitril-valsartan (ENTRESTO) 49-51 MG Take 1 tablet by mouth 2 (two) times daily. 60 tablet 1   spironolactone (ALDACTONE) 25 MG tablet Take 0.5 tablets (12.5 mg total) by mouth daily. 15 tablet 6   No current facility-administered medications for this encounter.    No Known Allergies    Social History   Socioeconomic History   Marital status: Single    Spouse name: Not on  file   Number of children: 1   Years of education: Not on file   Highest education level: High school graduate  Occupational History   Occupation: [employer]    Comment: fired when admit6ted to hospital  Tobacco Use   Smoking status: Former    Packs/day: .5    Types: Cigarettes    Quit date: 04/22/2023    Years since quitting: 0.0   Smokeless tobacco: Never  Vaping Use   Vaping Use: Never used  Substance and Sexual Activity   Alcohol use: Not Currently   Drug use: Not Currently    Types: Marijuana   Sexual activity: Not on file  Other Topics Concern   Not on file  Social History Narrative   Not on file   Social Determinants of Health   Financial Resource Strain: Low Risk  (04/29/2023)   Overall Financial Resource Strain (CARDIA)    Difficulty of Paying Living Expenses: Not hard at all  Food Insecurity: No Food Insecurity (04/19/2023)   Hunger Vital Sign    Worried About Running Out of Food in the Last Year: Never true    Ran Out of Food in the Last Year: Never true  Transportation Needs: No Transportation Needs (04/29/2023)    PRAPARE - Administrator, Civil Service (Medical): No    Lack of Transportation (Non-Medical): No  Physical Activity: Not on file  Stress: Not on file  Social Connections: Not on file  Intimate Partner Violence: Not At Risk (04/19/2023)   Humiliation, Afraid, Rape, and Kick questionnaire    Fear of Current or Ex-Partner: No    Emotionally Abused: No    Physically Abused: No    Sexually Abused: No      Family History  Problem Relation Age of Onset   Hypertension Mother     Vitals:   05/20/23 1156  BP: 114/76  Pulse: 71  SpO2: 97%  Weight: 64.2 kg (141 lb 9.6 oz)   Wt Readings from Last 3 Encounters:  05/20/23 64.2 kg (141 lb 9.6 oz)  05/14/23 63.9 kg (140 lb 12.8 oz)  05/01/23 61.7 kg (136 lb)    PHYSICAL EXAM: General:  Walked in the clinic.  No respiratory difficulty HEENT: normal Neck: supple. no JVD. Carotids 2+ bilat; no bruits. No lymphadenopathy or thryomegaly appreciated. Cor: PMI nondisplaced. Regular rate & rhythm. No rubs, gallops or murmurs. Sternal scar  Lungs: clear Abdomen: soft, nontender, nondistended. No hepatosplenomegaly. No bruits or masses. Good bowel sounds. Extremities: no cyanosis, clubbing, rash, edema. LUE scar  Neuro: alert & oriented x 3, cranial nerves grossly intact. moves all 4 extremities w/o difficulty. Affect pleasant.  ECG: SR  68 bpm   ASSESSMENT & PLAN: 1. Chronic HFrEF, ICM Echo Ef 30-35% post op TEE EF 40-45% LHC - severe multivessel disease. S/P CABG x5.  NYHA II GDMT -   Diuretic-Volume status stable . Continue lasix as needed.   BB- Continue carvedilol 25 mg twice a day.  Ace/ARB/ARNI- Continue entresto 49-51 mg twice a day  MRA- Continue spiro 12.5 mg spironolactone daily , recently cut back due to dizziness.  SGLT2i- Continue farxiga  10 mg daily  Plan for repeat ECHO in August.  Check BMET/BNP today   2. PAF EKG today shows NSR.  - Cut Amio down to 100 mg daily  -Apixaban 5 mg twice a day  - Stop  per CT Surgery.   3. CAD S/P CABG x5 On statin, bb, eliquis.  Appear stable.  4. Tobacco Abuse No longer smoking.   Referred to HFSW (PCP, Medications, Transportation, ETOH Abuse, Drug Abuse, Insurance, Financial ):  No Refer to Pharmacy:  No Refer to Home Health:  No Refer to Advanced Heart Failure Clinic: No  Refer to General Cardiology: Dr Wyline Mood   Follow up as needed   Dorianna Mckiver NP-C  12:03 PM

## 2023-05-20 NOTE — Patient Instructions (Addendum)
Thank you for coming in today  If you had labs drawn today, any labs that are abnormal the clinic will call you No news is good news  Medications: Decrease Amiodarone to 100 mg by mouth daily.   Follow up appointments: Please call our office if needed.  Your physician recommends that you schedule a follow-up appointment in:     Do the following things EVERYDAY: Weigh yourself in the morning before breakfast. Write it down and keep it in a log. Take your medicines as prescribed Eat low salt foods--Limit salt (sodium) to 2000 mg per day.  Stay as active as you can everyday Limit all fluids for the day to less than 2 liters   At the [facility], you and your health needs are our priority. As part of our continuing mission to provide you with exceptional heart care, we have created designated Provider Care Teams. These Care Teams include your primary Cardiologist (physician) and Advanced Practice Providers (APPs- Physician Assistants and Nurse Practitioners) who all work together to provide you with the care you need, when you need it.   You may see any of the following providers on your designated Care Team at your next follow up: Dr Arvilla Meres Dr Marca Ancona Dr. Marcos Eke, NP Robbie Lis, [place] [facility] [place], [place] Brynda Peon, NP Karle Plumber, PharmD   Please be sure to bring in all your medications bottles to every appointment.    Thank you for choosing [facility]  If you have any questions or concerns before your next appointment please send [place] a message through [place] or call our office at [phone].    TO LEAVE A MESSAGE FOR THE NURSE SELECT OPTION 2, PLEASE LEAVE A MESSAGE INCLUDING: YOUR NAME DATE OF BIRTH CALL BACK NUMBER REASON FOR CALL**this is important as we prioritize the call backs  YOU WILL RECEIVE A CALL BACK THE SAME DAY AS LONG AS YOU CALL BEFORE 4:00  PM

## 2023-05-20 NOTE — Addendum Note (Signed)
Encounter addended by: Marcy Siren, LCSW on: 05/20/2023 3:39 PM  Actions taken: Clinical Note Signed

## 2023-05-20 NOTE — Progress Notes (Signed)
CSW  contacted patient via phone to follow up on PCP. Patient has an appointment with DR. McInnis in [place]. No further needs at this time. CSW left contact information if needed in the future. Lasandra Beech, LCSW, CCSW-MCS [phone]

## 2023-05-24 ENCOUNTER — Other Ambulatory Visit: Payer: Self-pay | Admitting: Thoracic Surgery (Cardiothoracic Vascular Surgery)

## 2023-05-24 DIAGNOSIS — Z951 Presence of aortocoronary bypass graft: Secondary | ICD-10-CM

## 2023-05-28 ENCOUNTER — Other Ambulatory Visit: Payer: Self-pay | Admitting: *Deleted

## 2023-05-28 ENCOUNTER — Encounter: Payer: Self-pay | Admitting: Thoracic Surgery (Cardiothoracic Vascular Surgery)

## 2023-05-28 ENCOUNTER — Ambulatory Visit (INDEPENDENT_AMBULATORY_CARE_PROVIDER_SITE_OTHER): Payer: Self-pay | Admitting: Thoracic Surgery (Cardiothoracic Vascular Surgery)

## 2023-05-28 ENCOUNTER — Ambulatory Visit
Admission: RE | Admit: 2023-05-28 | Discharge: 2023-05-28 | Disposition: A | Discharge status: Home / Self Care | Payer: Medicaid Other | Source: Ambulatory Visit | Attending: Thoracic Surgery (Cardiothoracic Vascular Surgery) | Admitting: Thoracic Surgery (Cardiothoracic Vascular Surgery)

## 2023-05-28 VITALS — BP 101/66 | HR 60 | Resp 20 | Ht 70.0 in | Wt 144.0 lb

## 2023-05-28 DIAGNOSIS — Z951 Presence of aortocoronary bypass graft: Secondary | ICD-10-CM

## 2023-05-28 NOTE — Progress Notes (Signed)
[address]       [place] [address]             [phone]    HPI: Mr. Huxley returns for a scheduled postoperative follow-up visit  Theus Gunsallus is a 47 year old man with a history of tobacco and marijuana use and hypertension who presented with chest pain and shortness of breath.  He had elevated troponin and BNP in the setting of a hypertensive emergency.  He had some mild dilatation of his ascending aorta.  Cardiac catheterization revealed three-vessel coronary disease.  He underwent coronary bypass grafting x 5 on 04/25/2023.  Postoperatively he had some atrial fibrillation but converted to sinus rhythm.  He did go home on amiodarone and apixaban.  Since discharge has been feeling well.  He is only taking oxycodone occasionally.  No recurrent angina.  No shortness of breath orthopnea or peripheral edema.  Patient Active Problem List   Diagnosis Date Noted   S/P CABG x 5 04/25/2023   Malnutrition of moderate degree (HCC) 04/23/2023   Cardiomyopathy due to hypertension, with heart failure (HCC) 04/22/2023   Pulmonary HTN (HCC) 04/22/2023   Coronary artery calcification 04/22/2023   Hypertensive emergency 04/19/2023   Acute exacerbation of chronic obstructive pulmonary disease (COPD) (HCC) 11/10/2019   HTN (hypertension) 11/10/2019   Tobacco abuse 11/10/2019   Acute bronchitis 11/09/2019     Current Outpatient Medications  Medication Sig Dispense Refill   albuterol (VENTOLIN HFA) 108 (90 Base) MCG/ACT inhaler Inhale 2 puffs into the lungs every 6 (six) hours as needed for wheezing or shortness of breath. 18 g 2   amiodarone (PACERONE) 200 MG tablet Take 0.5 tablets (100 mg total) by mouth daily. 30 tablet 11   apixaban (ELIQUIS) 5 MG TABS tablet Take 1 tablet (5 mg total) by mouth 2 (two) times daily. 60 tablet 11   aspirin EC 81 MG tablet Take 1 tablet (81 mg total) by mouth daily. Swallow whole. 30 tablet 11   atorvastatin (LIPITOR) 40 MG tablet Take  1 tablet (40 mg total) by mouth daily. 30 tablet 11   carvedilol (COREG) 25 MG tablet Take 1 tablet (25 mg total) by mouth 2 (two) times daily with a meal. 60 tablet 11   dapagliflozin propanediol (FARXIGA) 10 MG TABS tablet Take 1 tablet (10 mg total) by mouth daily. 30 tablet 11   dextromethorphan-guaiFENesin (MUCINEX DM) 30-600 MG 12hr tablet Take 1 tablet by mouth 2 (two) times daily as needed for cough.     Fluticasone-Salmeterol (ADVAIR DISKUS) 250-50 MCG/DOSE AEPB Inhale 1 puff into the lungs 2 (two) times daily. (Patient taking differently: Inhale 1 puff into the lungs as needed.) 60 each 2   furosemide (LASIX) 40 MG tablet Take 1 tablet (40 mg total) by mouth daily as needed for edema (Weight gain of 3lbs or more in 24 hours or 5lbs or more in 48 hours). 30 tablet 11   isosorbide mononitrate (IMDUR) 30 MG 24 hr tablet Take 1 tablet (30 mg total) by mouth daily. 30 tablet 11   oxyCODONE (OXY IR/ROXICODONE) 5 MG immediate release tablet Take 1 tablet (5 mg total) by mouth every 6 (six) hours as needed for severe pain. 28 tablet 0   potassium chloride SA (KLOR-CON M) 20 MEQ tablet Take 1 tablet (20 mEq total) by mouth daily as needed (When you take Lasix). 30 tablet 5   sacubitril-valsartan (ENTRESTO) 49-51 MG Take 1 tablet by mouth 2 (two) times daily.  60 tablet 11   spironolactone (ALDACTONE) 25 MG tablet Take 0.5 tablets (12.5 mg total) by mouth daily. 15 tablet 11   No current facility-administered medications for this visit.    Physical Exam BP 101/66   Pulse 60   Resp 20   Ht 5\' 10"  (1.778 m)   Wt 144 lb (65.3 kg)   SpO2 100% Comment: RA  BMI 20.60 kg/m  47 year old man in no acute distress Alert and oriented x 3 with no focal deficits Sternum stable, incision healing well Cardiac regular rate and rhythm normal S1 and S2 Arm and leg incisions healing well, no edema Lungs clear with equal breath sounds bilaterally  Diagnostic Tests: I personally reviewed his chest x-ray  images. Postop changes.  No effusions or infiltrates.  Impression: Markez Ulrich is a 47 year old man with a history of tobacco and marijuana use and hypertension.  He presented with heart failure and ruled in for non-ST elevation MI.  Workup revealed severe three-vessel disease with moderate LV impairment.  He underwent coronary bypass grafting x 5.  Status post CABG-doing well now about a month out from surgery.  Still have some occasional pain requiring narcotics but for the most part she managed with Tylenol.  No recurrent angina.  I recommended he wait till 6 weeks postop to lifting things over 10 pounds in 8 weeks for anything over 20 pounds.  He is currently out of work I recommended he wait about 2 months before trying to do start work although he could start looking for a job in the interim.  Finish current prescription for amiodarone and apixaban and discontinue.  Has not yet heard from cardiac rehab.  Will contact the folks [facility] about that.  Plan: Finish current prescriptions for apixaban and amiodarone and then discontinue. Continue to follow-up with cardiology and advanced heart failure Referral to cardiac rehab at [facility] I will be happy to see Mr. Rabun back anytime in the future.  If further assistance with his care  Loreli Slot, MD [facility] [phone]

## 2023-06-03 ENCOUNTER — Ambulatory Visit: Payer: Medicaid Other | Admitting: Nurse Practitioner

## 2023-06-24 ENCOUNTER — Telehealth (HOSPITAL_COMMUNITY): Payer: Self-pay | Admitting: *Deleted

## 2023-06-24 NOTE — Telephone Encounter (Signed)
Cardiac Rehab Medication Review by a Nurse   Does the patient  feel that his/her medications are working for him/her?  yes   Has the patient been experiencing any side effects to the medications prescribed?  yes   Does the patient measure his/her own blood pressure or blood glucose at home?  yes    Does the patient have any problems obtaining medications due to transportation or finances?   no   Understanding of regimen: excellent Understanding of indications: excellent Potential of compliance: excellent     Having some itching, not sure if from medication.  Patient has discussed this with his PCP.  Verified medication via phone with patient. Nurse comments:

## 2023-06-28 ENCOUNTER — Encounter (HOSPITAL_COMMUNITY)
Admission: RE | Admit: 2023-06-28 | Discharge: 2023-06-28 | Disposition: A | Discharge status: Home / Self Care | Payer: Medicaid Other | Source: Ambulatory Visit | Attending: Thoracic Surgery (Cardiothoracic Vascular Surgery) | Admitting: Thoracic Surgery (Cardiothoracic Vascular Surgery)

## 2023-06-28 ENCOUNTER — Encounter (HOSPITAL_COMMUNITY): Payer: Self-pay

## 2023-06-28 VITALS — BP 102/76 | HR 69 | Ht 70.0 in | Wt 154.1 lb

## 2023-06-28 DIAGNOSIS — I214 Non-ST elevation (NSTEMI) myocardial infarction: Secondary | ICD-10-CM | POA: Diagnosis present

## 2023-06-28 DIAGNOSIS — Z951 Presence of aortocoronary bypass graft: Secondary | ICD-10-CM | POA: Insufficient documentation

## 2023-06-28 NOTE — Progress Notes (Signed)
Cardiac Individual Treatment Plan  Patient Details  Name: Caleb Perkins MRN: [id number] Date of Birth: Nov 28, 1976 Referring Provider:   Flowsheet Row CARDIAC REHAB PHASE II ORIENTATION from 06/28/2023 in [facility] CARDIAC REHABILITATION  Referring Provider Dr. Dorris Fetch       Initial Encounter Date:  Flowsheet Row CARDIAC REHAB PHASE II ORIENTATION from 06/28/2023 in [place] CARDIAC REHABILITATION  Date 06/28/23       Visit Diagnosis: S/P CABG x 5  NSTEMI (non-ST elevated myocardial infarction) (HCC)  Patient's Home Medications on Admission:  Current Outpatient Medications:    albuterol (VENTOLIN HFA) 108 (90 Base) MCG/ACT inhaler, Inhale 2 puffs into the lungs every 6 (six) hours as needed for wheezing or shortness of breath., Disp: 18 g, Rfl: 2   amiodarone (PACERONE) 200 MG tablet, Take 0.5 tablets (100 mg total) by mouth daily., Disp: 30 tablet, Rfl: 11   apixaban (ELIQUIS) 5 MG TABS tablet, Take 1 tablet (5 mg total) by mouth 2 (two) times daily. (Patient taking differently: Take 5 mg by mouth 2 (two) times daily. Take 1 tablet (5 mg) by mouth 2 (two) times daily. Total of 10 mg daily), Disp: 60 tablet, Rfl: 11   aspirin EC 81 MG tablet, Take 1 tablet (81 mg total) by mouth daily. Swallow whole., Disp: 30 tablet, Rfl: 11   atorvastatin (LIPITOR) 40 MG tablet, Take 1 tablet (40 mg total) by mouth daily., Disp: 30 tablet, Rfl: 11   carvedilol (COREG) 25 MG tablet, Take 1 tablet (25 mg total) by mouth 2 (two) times daily with a meal., Disp: 60 tablet, Rfl: 11   dapagliflozin propanediol (FARXIGA) 10 MG TABS tablet, Take 1 tablet (10 mg total) by mouth daily., Disp: 30 tablet, Rfl: 11   Fluticasone-Salmeterol (ADVAIR DISKUS) 250-50 MCG/DOSE AEPB, Inhale 1 puff into the lungs 2 (two) times daily. (Patient taking differently: Inhale 1 puff into the lungs as needed.), Disp: 60 each, Rfl: 2   furosemide (LASIX) 40 MG tablet, Take 1 tablet (40 mg total) by mouth daily as needed for  edema (Weight gain of 3lbs or more in 24 hours or 5lbs or more in 48 hours)., Disp: 30 tablet, Rfl: 11   isosorbide mononitrate (IMDUR) 30 MG 24 hr tablet, Take 1 tablet (30 mg total) by mouth daily., Disp: 30 tablet, Rfl: 11   oxyCODONE (OXY IR/ROXICODONE) 5 MG immediate release tablet, Take 1 tablet (5 mg total) by mouth every 6 (six) hours as needed for severe pain., Disp: 28 tablet, Rfl: 0   potassium chloride SA (KLOR-CON M) 20 MEQ tablet, Take 1 tablet (20 mEq total) by mouth daily as needed (When you take Lasix)., Disp: 30 tablet, Rfl: 5   sacubitril-valsartan (ENTRESTO) 49-51 MG, Take 1 tablet by mouth 2 (two) times daily., Disp: 60 tablet, Rfl: 11   spironolactone (ALDACTONE) 25 MG tablet, Take 0.5 tablets (12.5 mg total) by mouth daily., Disp: 15 tablet, Rfl: 11  Past Medical History: Past Medical History:  Diagnosis Date   Blood transfusion without reported diagnosis    Hypertension     Tobacco Use: Social History   Tobacco Use  Smoking Status Former   Packs/day: .5   Types: Cigarettes   Quit date: 04/22/2023   Years since quitting: 0.1  Smokeless Tobacco Never    Labs: Review Flowsheet       Latest Ref Rng & Units 04/20/2023 04/22/2023 04/25/2023  Labs for ITP Cardiac and Pulmonary Rehab  Cholestrol 0 - 200 mg/dL 409  - -  LDL (calc) 0 - 99 mg/dL 161  - -  HDL-C >09 mg/dL 35  - -  Trlycerides <604 mg/dL 540  - -  Hemoglobin J8J 4.8 - 5.6 % 5.2  - -  PH, Arterial 7.35 - 7.45 - 7.369  7.280  7.327  7.337  7.297  7.304  7.307   PCO2 arterial 32 - 48 mmHg - 40.6  42.2  38.9  37.2  40.9  39.2  40.5   Bicarbonate 20.0 - 28.0 mmol/L - 23.4  23.9  20.0  20.5  20.1  20.0  22.1  19.4  20.2   TCO2 22 - 32 mmol/L - 25  25  21  22  21  21  21  23  24  23  21  23  23  21    Acid-base deficit 0.0 - 2.0 mmol/L - 2.0  2.0  7.0  5.0  5.0  6.0  4.0  6.0  6.0   O2 Saturation % - 99  75  97  96  91  99  83  100  100     Capillary Blood Glucose: Lab Results  Component Value Date    GLUCAP 101 (H) 04/28/2023   GLUCAP 98 04/28/2023   GLUCAP 145 (H) 04/28/2023   GLUCAP 161 (H) 04/27/2023   GLUCAP 119 (H) 04/27/2023     Exercise Target Goals: Exercise Program Goal: Individual exercise prescription set using results from initial 6 min walk test and THRR while considering  patient's activity barriers and safety.   Exercise Prescription Goal: Starting with aerobic activity 30 plus minutes a day, 3 days per week for initial exercise prescription. Provide home exercise prescription and guidelines that participant acknowledges understanding prior to discharge.  Activity Barriers & Risk Stratification:  Activity Barriers & Cardiac Risk Stratification - 06/28/23 1232       Activity Barriers & Cardiac Risk Stratification   Activity Barriers Shortness of Breath;Back Problems;Incisional Pain    Cardiac Risk Stratification High             6 Minute Walk:  6 Minute Walk     Row Name 06/28/23 1338         6 Minute Walk   Phase Initial     Distance 1350 feet     Walk Time 6 minutes     # of Rest Breaks 0     MPH 2.55     METS 4.39     RPE 12     VO2 Peak 15.37     Symptoms No     Resting HR 69 bpm     Resting BP 102/76     Resting Oxygen Saturation  97 %     Exercise Oxygen Saturation  during 6 min walk 97 %     Max Ex. HR 66 bpm     Max Ex. BP 116/70     2 Minute Post BP 108/70              Oxygen Initial Assessment:   Oxygen Re-Evaluation:   Oxygen Discharge (Final Oxygen Re-Evaluation):   Initial Exercise Prescription:  Initial Exercise Prescription - 06/28/23 1300       Date of Initial Exercise RX and Referring Provider   Date 06/28/23    Referring Provider Dr. Dorris Fetch    Expected Discharge Date 09/18/23      Treadmill   MPH 1.4    Grade 0    Minutes 17  NuStep   Level 1    SPM 60    Minutes 22      Prescription Details   Frequency (times per week) 3    Duration Progress to 30 minutes of continuous aerobic  without signs/symptoms of physical distress      Intensity   THRR 40-80% of Max Heartrate 70-141    Ratings of Perceived Exertion 11-13      Resistance Training   Training Prescription Yes    Weight 4    Reps 10-15             Perform Capillary Blood Glucose checks as needed.  Exercise Prescription Changes:   Exercise Comments:   Exercise Goals and Review:   Exercise Goals     Row Name 06/28/23 1340             Exercise Goals   Increase Physical Activity Yes       Intervention Provide advice, education, support and counseling about physical activity/exercise needs.;Develop an individualized exercise prescription for aerobic and resistive training based on initial evaluation findings, risk stratification, comorbidities and participant's personal goals.       Expected Outcomes Short Term: Attend rehab on a regular basis to increase amount of physical activity.;Long Term: Add in home exercise to make exercise part of routine and to increase amount of physical activity.;Long Term: Exercising regularly at least 3-5 days a week.       Increase Strength and Stamina Yes       Intervention Provide advice, education, support and counseling about physical activity/exercise needs.;Develop an individualized exercise prescription for aerobic and resistive training based on initial evaluation findings, risk stratification, comorbidities and participant's personal goals.       Expected Outcomes Short Term: Increase workloads from initial exercise prescription for resistance, speed, and METs.;Short Term: Perform resistance training exercises routinely during rehab and add in resistance training at home;Long Term: Improve cardiorespiratory fitness, muscular endurance and strength as measured by increased METs and functional capacity ( )       Able to understand and use rate of perceived exertion (RPE) scale Yes       Intervention Provide education and explanation on how to use RPE scale        Expected Outcomes Short Term: Able to use RPE daily in rehab to express subjective intensity level;Long Term:  Able to use RPE to guide intensity level when exercising independently       Knowledge and understanding of Target Heart Rate Range (THRR) Yes       Intervention Provide education and explanation of THRR including how the numbers were predicted and where they are located for reference       Expected Outcomes Short Term: Able to state/look up THRR;Long Term: Able to use THRR to govern intensity when exercising independently;Short Term: Able to use daily as guideline for intensity in rehab       Able to check pulse independently Yes       Intervention Provide education and demonstration on how to check pulse in carotid and radial arteries.;Review the importance of being able to check your own pulse for safety during independent exercise       Expected Outcomes Short Term: Able to explain why pulse checking is important during independent exercise;Long Term: Able to check pulse independently and accurately       Understanding of Exercise Prescription Yes       Intervention Provide education, explanation, and written materials on patient's individual exercise prescription  Expected Outcomes Short Term: Able to explain program exercise prescription;Long Term: Able to explain home exercise prescription to exercise independently                Exercise Goals Re-Evaluation :    Discharge Exercise Prescription (Final Exercise Prescription Changes):   Nutrition:  Target Goals: Understanding of nutrition guidelines, daily intake of sodium 1500mg , cholesterol 200mg , calories 30% from fat and 7% or less from saturated fats, daily to have 5 or more servings of fruits and vegetables.  Biometrics:  Pre Biometrics - 06/28/23 1341       Pre Biometrics   Height 5\' 10"  (1.778 m)    Weight 69.9 kg    Waist Circumference 35 inches    Hip Circumference 36 inches    Waist to Hip  Ratio 0.97 %    BMI (Calculated) 22.11    Triceps Skinfold 5 mm    % Body Fat 17.9 %    Grip Strength 20.6 kg    Flexibility 0 in    Single Leg Stand 0 seconds              Nutrition Therapy Plan and Nutrition Goals:  Nutrition Therapy & Goals - 06/28/23 1320       Nutrition Therapy   RD appointment deferred Yes      Personal Nutrition Goals   Comments Patient scored 30 on his diet assessment. Handout explained and provided regarding healthier choices. Informed patient he could meet with dietician if interested. He declined. He says he is trying to eat a low salt, low fat heart healthy diet.      Intervention Plan   Intervention Nutrition handout(s) given to patient.             Nutrition Assessments:  Nutrition Assessments - 06/28/23 1320       MEDFICTS Scores   Pre Score 30            MEDIFICTS Score Key: ?70 Need to make dietary changes  40-70 Heart Healthy Diet ? 40 Therapeutic Level Cholesterol Diet   Picture Your Plate Scores: <16 Unhealthy dietary pattern with much room for improvement. 41-50 Dietary pattern unlikely to meet recommendations for good health and room for improvement. 51-60 More healthful dietary pattern, with some room for improvement.  >60 Healthy dietary pattern, although there may be some specific behaviors that could be improved.    Nutrition Goals Re-Evaluation:   Nutrition Goals Discharge (Final Nutrition Goals Re-Evaluation):   Psychosocial: Target Goals: Acknowledge presence or absence of significant depression and/or stress, maximize coping skills, provide positive support system. Participant is able to verbalize types and ability to use techniques and skills needed for reducing stress and depression.  Initial Review & Psychosocial Screening:  Initial Psych Review & Screening - 06/28/23 1335       Initial Review   Current issues with Current Anxiety/Panic;History of Depression;Current Depression;Current Stress  Concerns    Source of Stress Concerns Family;Financial    Comments Patient lost his job when he has his CABG. He is not able to pay child support and his son's mother does not allow him to have a relationship with his son and this along with no income is very stressful.      Family Dynamics   Good Support System? Yes      Barriers   Psychosocial barriers to participate in program There are no identifiable barriers or psychosocial needs.      Screening Interventions   Interventions Encouraged to  exercise;To provide support and resources with identified psychosocial needs;Provide feedback about the scores to participant    Expected Outcomes Long Term goal: The participant improves quality of Life and PHQ9 Scores as seen by post scores and/or verbalization of changes;Short Term goal: Utilizing psychosocial counselor, staff and physician to assist with identification of specific Stressors or current issues interfering with healing process. Setting desired goal for each stressor or current issue identified.;Long Term Goal: Stressors or current issues are controlled or eliminated.;Short Term goal: Identification and review with participant of any Quality of Life or Depression concerns found by scoring the questionnaire.             Quality of Life Scores:  Quality of Life - 06/28/23 1341       Quality of Life   Select Quality of Life      Quality of Life Scores   Health/Function Pre 26 %    Socioeconomic Pre 30 %    Psych/Spiritual Pre 30 %    Family Pre 30 %    GLOBAL Pre 28.29 %            Scores of 19 and below usually indicate a poorer quality of life in these areas.  A difference of  2-3 points is a clinically meaningful difference.  A difference of 2-3 points in the total score of the Quality of Life Index has been associated with significant improvement in overall quality of life, self-image, physical symptoms, and general health in studies assessing change in quality of  life.  PHQ-9: Review Flowsheet       06/28/2023  Depression screen PHQ 2/9  Decreased Interest 0  Down, Depressed, Hopeless 3  PHQ - 2 Score 3  Altered sleeping 3  Tired, decreased energy 3  Change in appetite 3  Feeling bad or failure about yourself  3  Trouble concentrating 3  Moving slowly or fidgety/restless 0  Suicidal thoughts 0  PHQ-9 Score 18  Difficult doing work/chores Extremely dIfficult   Interpretation of Total Score  Total Score Depression Severity:  1-4 = Minimal depression, 5-9 = Mild depression, 10-14 = Moderate depression, 15-19 = Moderately severe depression, 20-27 = Severe depression   Psychosocial Evaluation and Intervention:  Psychosocial Evaluation - 06/28/23 1337       Psychosocial Evaluation & Interventions   Interventions Stress management education;Relaxation education;Encouraged to exercise with the program and follow exercise prescription    Comments Patient has no psychosocial barriers to participate in CR identified at his orientation visit. His PHQ-9 score was 18. He says he has had depression and anxiety most all of his life. He says he has never tried any treatment and does not want to at this time. He feels like he is able to manage and cope on his own. 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 He is interested in vocational rehab. He lives with his mother. He did state he has been homeless a few years ago but his mother moved inorder to allow him to live with her. He has a 90 year old son. He says the son's mother moves around a lot and does not allow him to have a relationship with him and this "bothers" him a lot along with his lack of financial income. He says his mother does support him with some of his bills, food, and transportation. He says he looking forward to participating in the program hoping to get stronger and be  able to get employment.    Expected Outcomes Patient will continue to have no  psychosocial barriers identified.    Continue Psychosocial Services  No Follow up required             Psychosocial Re-Evaluation:   Psychosocial Discharge (Final Psychosocial Re-Evaluation):   Vocational Rehabilitation: Provide vocational rehab assistance to qualifying candidates.   Vocational Rehab Evaluation & Intervention:  Vocational Rehab - 06/28/23 1318       Vocational Rehab Re-Evaulation   Comments Patient does state he is interested in meeting with a vocational rehab counseling. I will make contact with our VR at [facility].             Education: Education Goals: Education classes will be provided on a weekly basis, covering required topics. Participant will state understanding/return demonstration of topics presented.  Learning Barriers/Preferences:  Learning Barriers/Preferences - 06/28/23 1322       Learning Barriers/Preferences   Learning Barriers None    Learning Preferences Written Material;Audio;Skilled Demonstration             Education Topics: Hypertension, Hypertension Reduction -Define heart disease and high blood pressure. Discus how high blood pressure affects the body and ways to reduce high blood pressure.   Exercise and Your Heart -Discuss why it is important to exercise, the FITT principles of exercise, normal and abnormal responses to exercise, and how to exercise safely.   Angina -Discuss definition of angina, causes of angina, treatment of angina, and how to decrease risk of having angina.   Cardiac Medications -Review what the following cardiac medications are used for, how they affect the body, and side effects that may occur when taking the medications.  Medications include Aspirin, Beta blockers, calcium channel blockers, ACE Inhibitors, angiotensin receptor blockers, diuretics, digoxin, and antihyperlipidemics.   Congestive Heart Failure -Discuss the definition of CHF, how to live with CHF, the signs and symptoms of  CHF, and how keep track of weight and sodium intake.   Heart Disease and Intimacy -Discus the effect sexual activity has on the heart, how changes occur during intimacy as we age, and safety during sexual activity.   Smoking Cessation / COPD -Discuss different methods to quit smoking, the health benefits of quitting smoking, and the definition of COPD.   Nutrition I: Fats -Discuss the types of cholesterol, what cholesterol does to the heart, and how cholesterol levels can be controlled.   Nutrition II: Labels -Discuss the different components of food labels and how to read food label   Heart Parts/Heart Disease and PAD -Discuss the anatomy of the heart, the pathway of blood circulation through the heart, and these are affected by heart disease.   Stress I: Signs and Symptoms -Discuss the causes of stress, how stress may lead to anxiety and depression, and ways to limit stress.   Stress II: Relaxation -Discuss different types of relaxation techniques to limit stress.   Warning Signs of Stroke / TIA -Discuss definition of a stroke, what the signs and symptoms are of a stroke, and how to identify when someone is having stroke.   Knowledge Questionnaire Score:  Knowledge Questionnaire Score - 06/28/23 1320       Knowledge Questionnaire Score   Pre Score 22/24             Core Components/Risk Factors/Patient Goals at Admission:  Personal Goals and Risk Factors at Admission - 06/28/23 1323       Core Components/Risk Factors/Patient Goals on Admission  Weight Management Weight Maintenance    Improve shortness of breath with ADL's Yes    Intervention Provide education, individualized exercise plan and daily activity instruction to help decrease symptoms of SOB with activities of daily living.    Expected Outcomes Short Term: Improve cardiorespiratory fitness to achieve a reduction of symptoms when performing ADLs;Long Term: Be able to perform more ADLs without  symptoms or delay the onset of symptoms    Hypertension Yes    Intervention Provide education on lifestyle modifcations including regular physical activity/exercise, weight management, moderate sodium restriction and increased consumption of fresh fruit, vegetables, and low fat dairy, alcohol moderation, and smoking cessation.;Monitor prescription use compliance.    Expected Outcomes Short Term: Continued assessment and intervention until BP is < 140/36mm HG in hypertensive participants. < 130/47mm HG in hypertensive participants with diabetes, heart failure or chronic kidney disease.;Long Term: Maintenance of blood pressure at goal levels.    Lipids Yes    Intervention Provide education and support for participant on nutrition & aerobic/resistive exercise along with prescribed medications to achieve LDL 70mg , HDL >40mg .    Expected Outcomes Short Term: Participant states understanding of desired cholesterol values and is compliant with medications prescribed. Participant is following exercise prescription and nutrition guidelines.;Long Term: Cholesterol controlled with medications as prescribed, with individualized exercise RX and with personalized nutrition plan. Value goals: LDL < 70mg , HDL > 40 mg.    Stress Yes    Intervention Offer individual and/or small group education and counseling on adjustment to heart disease, stress management and health-related lifestyle change. Teach and support self-help strategies.;Refer participants experiencing significant psychosocial distress to appropriate mental health specialists for further evaluation and treatment. When possible, include family members and significant others in education/counseling sessions.    Expected Outcomes Short Term: Participant demonstrates changes in health-related behavior, relaxation and other stress management skills, ability to obtain effective social support, and compliance with psychotropic medications if prescribed.;Long Term:  Emotional wellbeing is indicated by absence of clinically significant psychosocial distress or social isolation.    Personal Goal Other Yes    Personal Goal Patient wants to improve his strength and stamina; Walk longer distance without getting SOB; and be able to go back to work.    Intervention Patient will attend CR 3 days/week with exercise and education.    Expected Outcomes Patient will complete the program meeting both personal and program goals.             Core Components/Risk Factors/Patient Goals Review:    Core Components/Risk Factors/Patient Goals at Discharge (Final Review):    ITP Comments:   Comments: Patient arrived for 1st visit/orientation/education at 1230. Patient was referred to CR by Dr. Charlett Lango due to S/P CABGx5 (Z95.1) and NSTEMI (I21.4). During orientation advised patient on arrival and appointment times what to wear, what to do before, during and after exercise. Reviewed attendance and class policy.  Pt is scheduled to return Cardiac Rehab on 07/01/23 at 1100. Pt was advised to come to class 15 minutes before class starts.  Discussed RPE/Dpysnea scales. Patient participated in warm up stretches. Patient was able to complete 6 minute walk test.  Telemetry:NSR. Patient was measured for the equipment. Discussed equipment safety with patient. Took patient pre-anthropometric measurements. Patient finished visit at 1532.

## 2023-07-01 ENCOUNTER — Encounter (HOSPITAL_COMMUNITY)
Admission: RE | Admit: 2023-07-01 | Discharge: 2023-07-01 | Disposition: A | Discharge status: Home / Self Care | Payer: Medicaid Other | Source: Ambulatory Visit | Attending: Thoracic Surgery (Cardiothoracic Vascular Surgery) | Admitting: Thoracic Surgery (Cardiothoracic Vascular Surgery)

## 2023-07-01 VITALS — Wt 155.4 lb

## 2023-07-01 DIAGNOSIS — I214 Non-ST elevation (NSTEMI) myocardial infarction: Secondary | ICD-10-CM | POA: Diagnosis present

## 2023-07-01 DIAGNOSIS — Z951 Presence of aortocoronary bypass graft: Secondary | ICD-10-CM | POA: Insufficient documentation

## 2023-07-01 NOTE — Progress Notes (Signed)
Daily Session Note  Patient Details  Name: Caleb Perkins MRN: [id number] Date of Birth: October 21, 1976 Referring Provider:   Flowsheet Row CARDIAC REHAB PHASE II ORIENTATION from 06/28/2023 in [facility] CARDIAC REHABILITATION  Referring Provider Dr. Dorris Fetch       Encounter Date: 07/01/2023  Check In:  Session Check In - 07/01/23 1100       Check-In   Supervising physician immediately available to respond to emergencies [facility] MD immediately available    Physician(s) Dr. Wyline Mood    Location [facility]    Staff Present Ross Ludwig, BS, Exercise Physiologist;Debra Laural Benes, RN, BSN    Virtual Visit No    Medication changes reported     No    Fall or balance concerns reported    No    Tobacco Cessation No Change    Warm-up and Cool-down Performed as group-led instruction    Resistance Training Performed Yes    VAD Patient? No    PAD/SET Patient? No      Pain Assessment   Currently in Pain? No/denies    Pain Score 0-No pain    Multiple Pain Sites No             Capillary Blood Glucose: No results found for this or any previous visit (from the past 24 hour(s)).    Social History   Tobacco Use  Smoking Status Former   Packs/day: .5   Types: Cigarettes   Quit date: 04/22/2023   Years since quitting: 0.1  Smokeless Tobacco Never    Goals Met:  Independence with exercise equipment Exercise tolerated well No report of concerns or symptoms today Strength training completed today  Goals Unmet:  Not Applicable  Comments: check out 1200   Dr. Dina Rich is Medical Director for [facility] Cardiac Rehab

## 2023-07-03 ENCOUNTER — Encounter (HOSPITAL_COMMUNITY)
Admission: RE | Admit: 2023-07-03 | Discharge: 2023-07-03 | Disposition: A | Discharge status: Home / Self Care | Payer: Medicaid Other | Source: Ambulatory Visit | Attending: Thoracic Surgery (Cardiothoracic Vascular Surgery) | Admitting: Thoracic Surgery (Cardiothoracic Vascular Surgery)

## 2023-07-03 DIAGNOSIS — Z951 Presence of aortocoronary bypass graft: Secondary | ICD-10-CM

## 2023-07-03 DIAGNOSIS — I214 Non-ST elevation (NSTEMI) myocardial infarction: Secondary | ICD-10-CM

## 2023-07-03 NOTE — Progress Notes (Signed)
Daily Session Note  Patient Details  Name: Caleb Perkins MRN: [id number] Date of Birth: 01-19-1976 Referring Provider:   Flowsheet Row CARDIAC REHAB PHASE II ORIENTATION from 06/28/2023 in [facility] CARDIAC REHABILITATION  Referring Provider Dr. Dorris Fetch       Encounter Date: 07/03/2023  Check In:  Session Check In - 07/03/23 1100       Check-In   Supervising physician immediately available to respond to emergencies [facility] MD immediately available    Physician(s) Dr. Wyline Mood    Location [facility]    Staff Present Ross Ludwig, BS, Exercise Physiologist;Hillary Troutman BSN, RN    Virtual Visit No    Medication changes reported     No    Fall or balance concerns reported    No    Tobacco Cessation No Change    Warm-up and Cool-down Performed as group-led instruction    Resistance Training Performed Yes    VAD Patient? No    PAD/SET Patient? No      Pain Assessment   Currently in Pain? No/denies    Pain Score 0-No pain    Multiple Pain Sites No             Capillary Blood Glucose: No results found for this or any previous visit (from the past 24 hour(s)).    Social History   Tobacco Use  Smoking Status Former   Packs/day: .5   Types: Cigarettes   Quit date: 04/22/2023   Years since quitting: 0.1  Smokeless Tobacco Never    Goals Met:  Independence with exercise equipment Exercise tolerated well No report of concerns or symptoms today Strength training completed today  Goals Unmet:  Not Applicable  Comments: check out 1200   Dr. Dina Rich is Medical Director for [facility] Card[facility] Re[facility]

## 2023-07-05 ENCOUNTER — Encounter (HOSPITAL_COMMUNITY)
Admission: RE | Admit: 2023-07-05 | Discharge: 2023-07-05 | Disposition: A | Discharge status: Home / Self Care | Payer: Medicaid Other | Source: Ambulatory Visit | Attending: Thoracic Surgery (Cardiothoracic Vascular Surgery) | Admitting: Thoracic Surgery (Cardiothoracic Vascular Surgery)

## 2023-07-05 DIAGNOSIS — Z951 Presence of aortocoronary bypass graft: Secondary | ICD-10-CM

## 2023-07-05 DIAGNOSIS — I214 Non-ST elevation (NSTEMI) myocardial infarction: Secondary | ICD-10-CM

## 2023-07-05 NOTE — Progress Notes (Signed)
Daily Session Note  Patient Details  Name: Caleb Perkins MRN: [id number] Date of Birth: May 12, 1976 Referring Provider:   Flowsheet Row CARDIAC REHAB PHASE II ORIENTATION from 06/28/2023 in [facility] CARDIAC REHABILITATION  Referring Provider Dr. Dorris Fetch       Encounter Date: 07/05/2023  Check In:  Session Check In - 07/05/23 1058       Check-In   Supervising physician immediately available to respond to emergencies [facility] MD immediately available    Physician(s) Dr. Diona Browner    Location [facility]    Staff Present Rodena Medin, RN, BSN;Heather Fredric Mare, BS, Exercise Physiologist    Virtual Visit No    Medication changes reported     No    Fall or balance concerns reported    No    Warm-up and Cool-down Performed as group-led instruction    Resistance Training Performed Yes    VAD Patient? No    PAD/SET Patient? No      Pain Assessment   Currently in Pain? No/denies    Pain Score 0-No pain    Multiple Pain Sites No             Capillary Blood Glucose: No results found for this or any previous visit (from the past 24 hour(s)).    Social History   Tobacco Use  Smoking Status Former   Packs/day: .5   Types: Cigarettes   Quit date: 04/22/2023   Years since quitting: 0.2  Smokeless Tobacco Never    Goals Met:  Independence with exercise equipment Exercise tolerated well No report of concerns or symptoms today Strength training completed today  Goals Unmet:  Not Applicable  Comments: Check out 1200.   Dr. Dina Rich is Medical Director for [facility] Cardiac Rehab

## 2023-07-08 ENCOUNTER — Encounter (HOSPITAL_COMMUNITY)
Admission: RE | Admit: 2023-07-08 | Discharge: 2023-07-08 | Disposition: A | Discharge status: Home / Self Care | Payer: Medicaid Other | Source: Ambulatory Visit | Attending: Thoracic Surgery (Cardiothoracic Vascular Surgery) | Admitting: Thoracic Surgery (Cardiothoracic Vascular Surgery)

## 2023-07-08 DIAGNOSIS — I214 Non-ST elevation (NSTEMI) myocardial infarction: Secondary | ICD-10-CM

## 2023-07-08 DIAGNOSIS — Z951 Presence of aortocoronary bypass graft: Secondary | ICD-10-CM

## 2023-07-08 NOTE — Progress Notes (Signed)
Daily Session Note  Patient Details  Name: GABRAEL MARES MRN: [id number] Date of Birth: [birth year] Referring Provider:   Flowsheet Row CARDIAC REHAB PHASE II ORIENTATION from 06/28/2023 in [facility] CARDIAC REHABILITATION  Referring Provider Dr. Dorris Fetch       Encounter Date: 07/08/2023  Check In:  Session Check In - 07/08/23 1056       Check-In   Supervising physician immediately available to respond to emergencies [facility] MD immediately available    Physician(s) Dr. Diona Browner    Location [facility]    Staff Present Ross Ludwig, BS, Exercise Physiologist;Crystalina Stodghill, Margarite Gouge, RN, BSN;Other    Virtual Visit No    Medication changes reported     No    Fall or balance concerns reported    No    Tobacco Cessation No Change    Warm-up and Cool-down Performed as group-led instruction    Resistance Training Performed Yes    VAD Patient? No    PAD/SET Patient? No      Pain Assessment   Currently in Pain? No/denies    Pain Score 0-No pain    Multiple Pain Sites No             Capillary Blood Glucose: No results found for this or any previous visit (from the past 24 hour(s)).    Social History   Tobacco Use  Smoking Status Former   Packs/day: .5   Types: Cigarettes   Quit date: 04/22/2023   Years since quitting: 0.2  Smokeless Tobacco Never    Goals Met:  Independence with exercise equipment Exercise tolerated well No report of concerns or symptoms today Strength training completed today  Goals Unmet:  Not Applicable  Comments: check out @ 12:00   Dr. Dina Rich is Medical Director for [facility] Cardiac Rehab

## 2023-07-10 ENCOUNTER — Encounter (HOSPITAL_COMMUNITY)
Admission: RE | Admit: 2023-07-10 | Discharge: 2023-07-10 | Disposition: A | Discharge status: Home / Self Care | Payer: Medicaid Other | Source: Ambulatory Visit | Attending: Thoracic Surgery (Cardiothoracic Vascular Surgery) | Admitting: Thoracic Surgery (Cardiothoracic Vascular Surgery)

## 2023-07-10 DIAGNOSIS — Z951 Presence of aortocoronary bypass graft: Secondary | ICD-10-CM

## 2023-07-10 DIAGNOSIS — I214 Non-ST elevation (NSTEMI) myocardial infarction: Secondary | ICD-10-CM | POA: Diagnosis not present

## 2023-07-10 NOTE — Progress Notes (Signed)
Daily Session Note  Patient Details  Name: Caleb Perkins MRN: [id number] Date of Birth: 1976-07-02 Referring Provider:   Flowsheet Row CARDIAC REHAB PHASE II ORIENTATION from 06/28/2023 in [facility] CARDIAC REHABILITATION  Referring Provider Dr. Dorris Fetch       Encounter Date: 07/10/2023  Check In:  Session Check In - 07/10/23 1059       Check-In   Supervising physician immediately available to respond to emergencies [facility] MD immediately available    Physician(s) Dr. Diona Browner    Location [facility]    Staff Present Fabio Pierce, MA, RCEP, CCRP, Dow Adolph, RN, BSN    Virtual Visit No    Medication changes reported     No    Fall or balance concerns reported    No    Warm-up and Cool-down Performed on first and last piece of equipment    Resistance Training Performed Yes    VAD Patient? No    PAD/SET Patient? No      Pain Assessment   Currently in Pain? No/denies    Pain Score 0-No pain    Multiple Pain Sites No             Capillary Blood Glucose: No results found for this or any previous visit (from the past 24 hour(s)).    Social History   Tobacco Use  Smoking Status Former   Packs/day: .5   Types: Cigarettes   Quit date: 04/22/2023   Years since quitting: 0.2  Smokeless Tobacco Never    Goals Met:  Independence with exercise equipment Exercise tolerated well No report of concerns or symptoms today Strength training completed today  Goals Unmet:  Not Applicable  Comments: Pt able to follow exercise prescription today without complaint.  Will continue to monitor for progression.    Dr. Dina Rich is Medical Director for [facility] Cardiac Rehab

## 2023-07-12 ENCOUNTER — Encounter (HOSPITAL_COMMUNITY)
Admission: RE | Admit: 2023-07-12 | Discharge: 2023-07-12 | Disposition: A | Discharge status: Home / Self Care | Payer: Medicaid Other | Source: Ambulatory Visit | Attending: Thoracic Surgery (Cardiothoracic Vascular Surgery) | Admitting: Thoracic Surgery (Cardiothoracic Vascular Surgery)

## 2023-07-12 DIAGNOSIS — Z951 Presence of aortocoronary bypass graft: Secondary | ICD-10-CM

## 2023-07-12 DIAGNOSIS — I214 Non-ST elevation (NSTEMI) myocardial infarction: Secondary | ICD-10-CM | POA: Diagnosis not present

## 2023-07-12 NOTE — Progress Notes (Signed)
Daily Session Note  Patient Details  Name: Caleb Perkins MRN: [id number] Date of Birth: 12/13/1976 Referring Provider:   Flowsheet Row CARDIAC REHAB PHASE II ORIENTATION from 06/28/2023 in [place] CARDIAC REHABILITATION  Referring Provider Dr. Dorris Fetch       Encounter Date: 07/12/2023  Check In:  Session Check In - 07/12/23 1055       Check-In   Supervising physician immediately available to respond to emergencies [facility] MD immediately available    Physician(s) Dr. Lequita Asal    Location [facility]    Staff Present Rodena Medin, RN, BSN;Jessica Hawkins, MA, RCEP, CCRP, CCET    Virtual Visit No    Medication changes reported     No    Fall or balance concerns reported    No    Warm-up and Cool-down Performed on first and last piece of equipment    Resistance Training Performed Yes    VAD Patient? No    PAD/SET Patient? No      Pain Assessment   Currently in Pain? No/denies    Pain Score 0-No pain    Multiple Pain Sites No             Capillary Blood Glucose: No results found for this or any previous visit (from the past 24 hour(s)).    Social History   Tobacco Use  Smoking Status Former   Current packs/day: 0.00   Types: Cigarettes   Quit date: 04/22/2023   Years since quitting: 0.2  Smokeless Tobacco Never    Goals Met:  Independence with exercise equipment Exercise tolerated well No report of concerns or symptoms today Strength training completed today  Goals Unmet:  Not Applicable  Comments: Pt able to follow exercise prescription today without complaint.  Will continue to monitor for progression.    Dr. Dina Rich is Medical Director for [facility] Cardiac Rehab

## 2023-07-12 NOTE — Progress Notes (Signed)
Reviewed home exercise with pt today.  Pt plans to walk and use weights at home for exercise.  Reviewed THR, pulse, RPE, sign and symptoms, pulse oximetery and when to call 911 or MD.  Also discussed weather considerations and indoor options.  Pt voiced understanding.  

## 2023-07-15 ENCOUNTER — Telehealth: Payer: Self-pay | Admitting: Nurse Practitioner

## 2023-07-15 ENCOUNTER — Encounter (HOSPITAL_COMMUNITY)
Admission: RE | Admit: 2023-07-15 | Discharge: 2023-07-15 | Disposition: A | Discharge status: Home / Self Care | Payer: Medicaid Other | Source: Ambulatory Visit | Attending: Thoracic Surgery (Cardiothoracic Vascular Surgery) | Admitting: Thoracic Surgery (Cardiothoracic Vascular Surgery)

## 2023-07-15 ENCOUNTER — Other Ambulatory Visit: Payer: Self-pay | Admitting: Nurse Practitioner

## 2023-07-15 DIAGNOSIS — I214 Non-ST elevation (NSTEMI) myocardial infarction: Secondary | ICD-10-CM | POA: Diagnosis not present

## 2023-07-15 DIAGNOSIS — Z951 Presence of aortocoronary bypass graft: Secondary | ICD-10-CM

## 2023-07-15 MED ORDER — AMIODARONE HCL 200 MG PO TABS: 100.0000 mg | ORAL_TABLET | Freq: Every day | ORAL | 0 refills | Status: DC

## 2023-07-15 NOTE — Telephone Encounter (Signed)
Pt c/o medication issue:  1. Name of Medication: Amiodarone   How are you currently taking this medication (dosage and times per day)?   3. Are you having a reaction (difficulty breathing--STAT)?   4. What is your medication issue?  Mother called, she wanted to know if patient is still supposed to be takin Amiodarone?

## 2023-07-15 NOTE — Telephone Encounter (Signed)
Spoke with Patients mother and advised them per our records patient is still on this medication. Enough medication refilled until next visit

## 2023-07-15 NOTE — Progress Notes (Signed)
Daily Session Note  Patient Details  Name: Caleb Perkins MRN: [id number] Date of Birth: [birth year] Referring Provider:   Flowsheet Row CARDIAC REHAB PHASE II ORIENTATION from 06/28/2023 in [facility] CARDIAC REHABILITATION  Referring Provider Dr. Dorris Fetch       Encounter Date: 07/15/2023  Check In:  Session Check In - 07/15/23 1053       Check-In   Supervising physician immediately available to respond to emergencies [facility] MD immediately available    Physician(s) Dr. Jenene Slicker    Location [facility]    Staff Present Ross Ludwig, BS, Exercise Physiologist;Daphyne Daphine Deutscher, RN, BSN    Virtual Visit No    Medication changes reported     No    Fall or balance concerns reported    No    Tobacco Cessation No Change    Warm-up and Cool-down Performed on first and last piece of equipment    Resistance Training Performed Yes    VAD Patient? No    PAD/SET Patient? No      Pain Assessment   Currently in Pain? No/denies    Pain Score 0-No pain    Multiple Pain Sites No             Capillary Blood Glucose: No results found for this or any previous visit (from the past 24 hour(s)).    Social History   Tobacco Use  Smoking Status Former   Current packs/day: 0.00   Types: Cigarettes   Quit date: 04/22/2023   Years since quitting: 0.2  Smokeless Tobacco Never    Goals Met:  Independence with exercise equipment Exercise tolerated well No report of concerns or symptoms today Strength training completed today  Goals Unmet:  Not Applicable  Comments: Pt able to follow exercise prescription today without complaint.  Will continue to monitor for progression.    Dr. Dina Rich is Medical Director for [facility] Cardiac Rehab

## 2023-07-17 ENCOUNTER — Encounter (HOSPITAL_COMMUNITY)
Admission: RE | Admit: 2023-07-17 | Discharge: 2023-07-17 | Disposition: A | Discharge status: Home / Self Care | Payer: Medicaid Other | Source: Ambulatory Visit | Attending: Thoracic Surgery (Cardiothoracic Vascular Surgery) | Admitting: Thoracic Surgery (Cardiothoracic Vascular Surgery)

## 2023-07-17 ENCOUNTER — Encounter (HOSPITAL_COMMUNITY): Payer: Self-pay | Admitting: *Deleted

## 2023-07-17 DIAGNOSIS — Z951 Presence of aortocoronary bypass graft: Secondary | ICD-10-CM

## 2023-07-17 DIAGNOSIS — I214 Non-ST elevation (NSTEMI) myocardial infarction: Secondary | ICD-10-CM | POA: Diagnosis not present

## 2023-07-17 NOTE — Progress Notes (Signed)
Daily Session Note  Patient Details  Name: Caleb Perkins MRN: [id number] Date of Birth: October 30, 1976 Referring Provider:   Flowsheet Row CARDIAC REHAB PHASE II ORIENTATION from 06/28/2023 in [facility] CARDIAC REHABILITATION  Referring Provider Dr. Dorris Fetch       Encounter Date: 07/17/2023  Check In:  Session Check In - 07/17/23 1100       Check-In   Supervising physician immediately available to respond to emergencies [facility] MD immediately available    Physician(s) Dr. Jenene Slicker    Location [facility]    Staff Present Fabio Pierce, MA, RCEP, CCRP, Dow Adolph, RN, BSN    Virtual Visit No    Medication changes reported     No    Fall or balance concerns reported    No    Warm-up and Cool-down Performed as group-led Writer Performed Yes    VAD Patient? No    PAD/SET Patient? No      Pain Assessment   Currently in Pain? No/denies    Pain Score 0-No pain    Multiple Pain Sites No             Capillary Blood Glucose: No results found for this or any previous visit (from the past 24 hour(s)).    Social History   Tobacco Use  Smoking Status Former   Current packs/day: 0.00   Types: Cigarettes   Quit date: 04/22/2023   Years since quitting: 0.2  Smokeless Tobacco Never    Goals Met:  Independence with exercise equipment Exercise tolerated well No report of concerns or symptoms today Strength training completed today  Goals Unmet:  Not Applicable  Comments: Check out 1200.   Dr. Dina Rich is Medical Director for [facility] Cardiac Rehab

## 2023-07-17 NOTE — Progress Notes (Signed)
Cardiac Individual Treatment Plan  Patient Details  Name: Caleb Perkins MRN: [id number] Date of Birth: January 01, 1976 Referring Provider:   Flowsheet Row CARDIAC REHAB PHASE II ORIENTATION from 06/28/2023 in [facility] CARDIAC REHABILITATION  Referring Provider Dr. Dorris Fetch       Initial Encounter Date:  Flowsheet Row CARDIAC REHAB PHASE II ORIENTATION from 06/28/2023 in [place] CARDIAC REHABILITATION  Date 06/28/23       Visit Diagnosis: NSTEMI (non-ST elevated myocardial infarction) (HCC)  S/P CABG x 5  Patient's Home Medications on Admission:  Current Outpatient Medications:    albuterol (VENTOLIN HFA) 108 (90 Base) MCG/ACT inhaler, Inhale 2 puffs into the lungs every 6 (six) hours as needed for wheezing or shortness of breath., Disp: 18 g, Rfl: 2   amiodarone (PACERONE) 200 MG tablet, Take 0.5 tablets (100 mg total) by mouth daily., Disp: 30 tablet, Rfl: 0   apixaban (ELIQUIS) 5 MG TABS tablet, Take 1 tablet (5 mg total) by mouth 2 (two) times daily. (Patient taking differently: Take 5 mg by mouth 2 (two) times daily. Take 1 tablet (5 mg) by mouth 2 (two) times daily. Total of 10 mg daily), Disp: 60 tablet, Rfl: 11   aspirin EC 81 MG tablet, Take 1 tablet (81 mg total) by mouth daily. Swallow whole., Disp: 30 tablet, Rfl: 11   atorvastatin (LIPITOR) 40 MG tablet, Take 1 tablet (40 mg total) by mouth daily., Disp: 30 tablet, Rfl: 11   carvedilol (COREG) 25 MG tablet, Take 1 tablet (25 mg total) by mouth 2 (two) times daily with a meal., Disp: 60 tablet, Rfl: 11   dapagliflozin propanediol (FARXIGA) 10 MG TABS tablet, Take 1 tablet (10 mg total) by mouth daily., Disp: 30 tablet, Rfl: 11   Fluticasone-Salmeterol (ADVAIR DISKUS) 250-50 MCG/DOSE AEPB, Inhale 1 puff into the lungs 2 (two) times daily. (Patient taking differently: Inhale 1 puff into the lungs as needed.), Disp: 60 each, Rfl: 2   furosemide (LASIX) 40 MG tablet, Take 1 tablet (40 mg total) by mouth daily as needed for  edema (Weight gain of 3lbs or more in 24 hours or 5lbs or more in 48 hours)., Disp: 30 tablet, Rfl: 11   isosorbide mononitrate (IMDUR) 30 MG 24 hr tablet, Take 1 tablet (30 mg total) by mouth daily., Disp: 30 tablet, Rfl: 11   oxyCODONE (OXY IR/ROXICODONE) 5 MG immediate release tablet, Take 1 tablet (5 mg total) by mouth every 6 (six) hours as needed for severe pain., Disp: 28 tablet, Rfl: 0   potassium chloride SA (KLOR-CON M) 20 MEQ tablet, Take 1 tablet (20 mEq total) by mouth daily as needed (When you take Lasix)., Disp: 30 tablet, Rfl: 5   sacubitril-valsartan (ENTRESTO) 49-51 MG, Take 1 tablet by mouth 2 (two) times daily., Disp: 60 tablet, Rfl: 11   spironolactone (ALDACTONE) 25 MG tablet, Take 0.5 tablets (12.5 mg total) by mouth daily., Disp: 15 tablet, Rfl: 11  Past Medical History: Past Medical History:  Diagnosis Date   Blood transfusion without reported diagnosis    Hypertension     Tobacco Use: Social History   Tobacco Use  Smoking Status Former   Current packs/day: 0.00   Types: Cigarettes   Quit date: 04/22/2023   Years since quitting: 0.2  Smokeless Tobacco Never    Labs: Review Flowsheet       Latest Ref Rng & Units 04/20/2023 04/22/2023 04/25/2023  Labs for ITP Cardiac and Pulmonary Rehab  Cholestrol 0 - 200 mg/dL 409  - -  LDL (calc) 0 - 99 mg/dL 811  - -  HDL-C >91 mg/dL 35  - -  Trlycerides <478 mg/dL 295  - -  Hemoglobin A2Z 4.8 - 5.6 % 5.2  - -  PH, Arterial 7.35 - 7.45 - 7.369  7.280  7.327  7.337  7.297  7.304  7.307   PCO2 arterial 32 - 48 mmHg - 40.6  42.2  38.9  37.2  40.9  39.2  40.5   Bicarbonate 20.0 - 28.0 mmol/L - 23.4  23.9  20.0  20.5  20.1  20.0  22.1  19.4  20.2   TCO2 22 - 32 mmol/L - 25  25  21  22  21  21  21  23  24  23  21  23  23  21    Acid-base deficit 0.0 - 2.0 mmol/L - 2.0  2.0  7.0  5.0  5.0  6.0  4.0  6.0  6.0   O2 Saturation % - 99  75  97  96  91  99  83  100  100     Details       Multiple values from one day are sorted  in reverse-chronological order         Capillary Blood Glucose: Lab Results  Component Value Date   GLUCAP 101 (H) 04/28/2023   GLUCAP 98 04/28/2023   GLUCAP 145 (H) 04/28/2023   GLUCAP 161 (H) 04/27/2023   GLUCAP 119 (H) 04/27/2023     Exercise Target Goals: Exercise Program Goal: Individual exercise prescription set using results from initial 6 min walk test and THRR while considering  patient's activity barriers and safety.   Exercise Prescription Goal: Starting with aerobic activity 30 plus minutes a day, 3 days per week for initial exercise prescription. Provide home exercise prescription and guidelines that participant acknowledges understanding prior to discharge.  Activity Barriers & Risk Stratification:  Activity Barriers & Cardiac Risk Stratification - 06/28/23 1232       Activity Barriers & Cardiac Risk Stratification   Activity Barriers Shortness of Breath;Back Problems;Incisional Pain    Cardiac Risk Stratification High             6 Minute Walk:  6 Minute Walk     Row Name 06/28/23 1338         6 Minute Walk   Phase Initial     Distance 1350 feet     Walk Time 6 minutes     # of Rest Breaks 0     MPH 2.55     METS 4.39     RPE 12     VO2 Peak 15.37     Symptoms No     Resting HR 69 bpm     Resting BP 102/76     Resting Oxygen Saturation  97 %     Exercise Oxygen Saturation  during 6 min walk 97 %     Max Ex. HR 66 bpm     Max Ex. BP 116/70     2 Minute Post BP 108/70              Oxygen Initial Assessment:   Oxygen Re-Evaluation:   Oxygen Discharge (Final Oxygen Re-Evaluation):   Initial Exercise Prescription:  Initial Exercise Prescription - 06/28/23 1300       Date of Initial Exercise RX and Referring Provider   Date 06/28/23    Referring Provider Dr. Dorris Fetch    Expected Discharge Date 09/18/23  Treadmill   MPH 1.4    Grade 0    Minutes 17      NuStep   Level 1    SPM 60    Minutes 22       Prescription Details   Frequency (times per week) 3    Duration Progress to 30 minutes of continuous aerobic without signs/symptoms of physical distress      Intensity   THRR 40-80% of Max Heartrate 70-141    Ratings of Perceived Exertion 11-13      Resistance Training   Training Prescription Yes    Weight 4    Reps 10-15             Perform Capillary Blood Glucose checks as needed.  Exercise Prescription Changes:   Exercise Prescription Changes     Row Name 07/01/23 1200 07/12/23 1100 07/15/23 1500         Response to Exercise   Blood Pressure (Admit) 108/70 -- 104/74     Blood Pressure (Exercise) 126/68 -- 110/74     Blood Pressure (Exit) 108/70 -- 102/60     Heart Rate (Admit) 59 bpm -- 70 bpm     Heart Rate (Exercise) 75 bpm -- 76 bpm     Heart Rate (Exit) 67 bpm -- 61 bpm     Rating of Perceived Exertion (Exercise) 12 -- 12     Duration Continue with 30 min of aerobic exercise without signs/symptoms of physical distress. -- Continue with 30 min of aerobic exercise without signs/symptoms of physical distress.     Intensity THRR unchanged -- THRR unchanged       Progression   Progression Continue to progress workloads to maintain intensity without signs/symptoms of physical distress. -- Continue to progress workloads to maintain intensity without signs/symptoms of physical distress.       Resistance Training   Training Prescription Yes -- Yes     Weight 4 -- 4     Reps 10-15 -- 10-15     Time 10 Minutes -- --       Treadmill   MPH 1.4 -- 1.5     Grade 0 -- 0.5     Minutes 17 -- 15     METs 2.07 -- 2.25       NuStep   Level 1 -- 2     SPM 54 -- 73     Minutes 22 -- 15     METs 1.73 -- 2       Home Exercise Plan   Plans to continue exercise at -- Home (comment)  walking, weights --     Frequency -- Add 2 additional days to program exercise sessions. --     Initial Home Exercises Provided -- 07/12/23 --       Oxygen   Maintain Oxygen Saturation -- --  88% or higher              Exercise Comments:   Exercise Goals and Review:   Exercise Goals     Row Name 06/28/23 1340 07/15/23 1553           Exercise Goals   Increase Physical Activity Yes Yes      Intervention Provide advice, education, support and counseling about physical activity/exercise needs.;Develop an individualized exercise prescription for aerobic and resistive training based on initial evaluation findings, risk stratification, comorbidities and participant's personal goals. Provide advice, education, support and counseling about physical activity/exercise needs.;Develop an individualized exercise prescription for aerobic  and resistive training based on initial evaluation findings, risk stratification, comorbidities and participant's personal goals.      Expected Outcomes Short Term: Attend rehab on a regular basis to increase amount of physical activity.;Long Term: Add in home exercise to make exercise part of routine and to increase amount of physical activity.;Long Term: Exercising regularly at least 3-5 days a week. Short Term: Attend rehab on a regular basis to increase amount of physical activity.;Long Term: Add in home exercise to make exercise part of routine and to increase amount of physical activity.;Long Term: Exercising regularly at least 3-5 days a week.      Increase Strength and Stamina Yes Yes      Intervention Provide advice, education, support and counseling about physical activity/exercise needs.;Develop an individualized exercise prescription for aerobic and resistive training based on initial evaluation findings, risk stratification, comorbidities and participant's personal goals. Provide advice, education, support and counseling about physical activity/exercise needs.;Develop an individualized exercise prescription for aerobic and resistive training based on initial evaluation findings, risk stratification, comorbidities and participant's personal goals.       Expected Outcomes Short Term: Increase workloads from initial exercise prescription for resistance, speed, and METs.;Short Term: Perform resistance training exercises routinely during rehab and add in resistance training at home;Long Term: Improve cardiorespiratory fitness, muscular endurance and strength as measured by increased METs and functional capacity ( ) Short Term: Increase workloads from initial exercise prescription for resistance, speed, and METs.;Short Term: Perform resistance training exercises routinely during rehab and add in resistance training at home;Long Term: Improve cardiorespiratory fitness, muscular endurance and strength as measured by increased METs and functional capacity ( )      Able to understand and use rate of perceived exertion (RPE) scale Yes Yes      Intervention Provide education and explanation on how to use RPE scale Provide education and explanation on how to use RPE scale      Expected Outcomes Short Term: Able to use RPE daily in rehab to express subjective intensity level;Long Term:  Able to use RPE to guide intensity level when exercising independently Short Term: Able to use RPE daily in rehab to express subjective intensity level;Long Term:  Able to use RPE to guide intensity level when exercising independently      Knowledge and understanding of Target Heart Rate Range (THRR) Yes Yes      Intervention Provide education and explanation of THRR including how the numbers were predicted and where they are located for reference Provide education and explanation of THRR including how the numbers were predicted and where they are located for reference      Expected Outcomes Short Term: Able to state/look up THRR;Long Term: Able to use THRR to govern intensity when exercising independently;Short Term: Able to use daily as guideline for intensity in rehab Short Term: Able to state/look up THRR;Long Term: Able to use THRR to govern intensity when exercising  independently;Short Term: Able to use daily as guideline for intensity in rehab      Able to check pulse independently Yes Yes      Intervention Provide education and demonstration on how to check pulse in carotid and radial arteries.;Review the importance of being able to check your own pulse for safety during independent exercise Provide education and demonstration on how to check pulse in carotid and radial arteries.;Review the importance of being able to check your own pulse for safety during independent exercise      Expected Outcomes Short Term: Able to explain  why pulse checking is important during independent exercise;Long Term: Able to check pulse independently and accurately Short Term: Able to explain why pulse checking is important during independent exercise;Long Term: Able to check pulse independently and accurately      Understanding of Exercise Prescription Yes Yes      Intervention Provide education, explanation, and written materials on patient's individual exercise prescription Provide education, explanation, and written materials on patient's individual exercise prescription      Expected Outcomes Short Term: Able to explain program exercise prescription;Long Term: Able to explain home exercise prescription to exercise independently Short Term: Able to explain program exercise prescription;Long Term: Able to explain home exercise prescription to exercise independently               Exercise Goals Re-Evaluation :  Exercise Goals Re-Evaluation     Row Name 07/12/23 1134 07/15/23 1553           Exercise Goal Re-Evaluation   Exercise Goals Review Increase Physical Activity;Increase Strength and Stamina;Able to understand and use rate of perceived exertion (RPE) scale;Able to understand and use Dyspnea scale;Knowledge and understanding of Target Heart Rate Range (THRR);Able to check pulse independently;Understanding of Exercise Prescription Increase Physical Activity;Increase  Strength and Stamina;Able to understand and use rate of perceived exertion (RPE) scale;Able to check pulse independently;Knowledge and understanding of Target Heart Rate Range (THRR);Understanding of Exercise Prescription      Comments Reviewed home exercise with pt today.  Pt plans to walk and use weights at home for exercise.  Reviewed THR, pulse, RPE, sign and symptoms, pulse oximetery and when to call 911 or MD.  Also discussed weather considerations and indoor options.  Pt voiced understanding. Pt has completed 8 sessions of cardiac rehab. He is motivated during class to exercise and plans to exercise at home on his days out of class. He is currently exerciisng at 2.25 METs on the treadmill. Will continue to monitor and progress as able,      Expected Outcomes Short: Start to add in exerise at home on off days Long; Continue to exercise independently Through exercise at rehab and home, patient will achieve their goals.                Discharge Exercise Prescription (Final Exercise Prescription Changes):  Exercise Prescription Changes - 07/15/23 1500       Response to Exercise   Blood Pressure (Admit) 104/74    Blood Pressure (Exercise) 110/74    Blood Pressure (Exit) 102/60    Heart Rate (Admit) 70 bpm    Heart Rate (Exercise) 76 bpm    Heart Rate (Exit) 61 bpm    Rating of Perceived Exertion (Exercise) 12    Duration Continue with 30 min of aerobic exercise without signs/symptoms of physical distress.    Intensity THRR unchanged      Progression   Progression Continue to progress workloads to maintain intensity without signs/symptoms of physical distress.      Resistance Training   Training Prescription Yes    Weight 4    Reps 10-15      Treadmill   MPH 1.5    Grade 0.5    Minutes 15    METs 2.25      NuStep   Level 2    SPM 73    Minutes 15    METs 2      Oxygen   Maintain Oxygen Saturation 88% or higher  Nutrition:  Target Goals:  Understanding of nutrition guidelines, daily intake of sodium 1500mg , cholesterol 200mg , calories 30% from fat and 7% or less from saturated fats, daily to have 5 or more servings of fruits and vegetables.  Biometrics:  Pre Biometrics - 06/28/23 1341       Pre Biometrics   Height 5\' 10"  (1.778 m)    Weight 154 lb 1.6 oz (69.9 kg)    Waist Circumference 35 inches    Hip Circumference 36 inches    Waist to Hip Ratio 0.97 %    BMI (Calculated) 22.11    Triceps Skinfold 5 mm    % Body Fat 17.9 %    Grip Strength 20.6 kg    Flexibility 0 in    Single Leg Stand 0 seconds              Nutrition Therapy Plan and Nutrition Goals:  Nutrition Therapy & Goals - 07/08/23 1035       Nutrition Therapy   RD appointment deferred Yes      Personal Nutrition Goals   Comments We provide educational sessions on heart healthy nutrition with handouts.      Intervention Plan   Intervention Nutrition handout(s) given to patient.    Expected Outcomes Short Term Goal: Understand basic principles of dietary content, such as calories, fat, sodium, cholesterol and nutrients.             Nutrition Assessments:  Nutrition Assessments - 06/28/23 1320       MEDFICTS Scores   Pre Score 30            MEDIFICTS Score Key: ?70 Need to make dietary changes  40-70 Heart Healthy Diet ? 40 Therapeutic Level Cholesterol Diet   Picture Your Plate Scores: <78 Unhealthy dietary pattern with much room for improvement. 41-50 Dietary pattern unlikely to meet recommendations for good health and room for improvement. 51-60 More healthful dietary pattern, with some room for improvement.  >60 Healthy dietary pattern, although there may be some specific behaviors that could be improved.    Nutrition Goals Re-Evaluation:   Nutrition Goals Discharge (Final Nutrition Goals Re-Evaluation):   Psychosocial: Target Goals: Acknowledge presence or absence of significant depression and/or stress,  maximize coping skills, provide positive support system. Participant is able to verbalize types and ability to use techniques and skills needed for reducing stress and depression.  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 He is not able to pay child support and his son's mother does not allow him to have a relationship with his son and this along with no income is very stressful.      Family Dynamics   Good Support System? Yes      Barriers   Psychosocial barriers to participate in program There are no identifiable barriers or psychosocial needs.      Screening Interventions   Interventions Encouraged to exercise;To provide support and resources with identified psychosocial needs;Provide feedback about the scores to participant    Expected Outcomes Long Term goal: The participant improves quality of Life and PHQ9 Scores as seen by post scores and/or verbalization of changes;Short Term goal: Utilizing psychosocial counselor, staff and physician to assist with identification of specific  Stressors or current issues interfering with healing process. Setting desired goal for each stressor or current issue identified.;Long Term Goal: Stressors or current issues are controlled or eliminated.;Short Term goal: Identification and review with participant of any Quality of Life or Depression concerns found by scoring the questionnaire.             Quality of Life Scores:  Quality of Life - 06/28/23 1341       Quality of Life   Select Quality of Life      Quality of Life Scores   Health/Function Pre 26 %    Socioeconomic Pre 30 %    Psych/Spiritual Pre 30 %    Family Pre 30 %    GLOBAL Pre 28.29 %             Scores of 19 and below usually indicate a poorer quality of life in these areas.  A difference of  2-3 points is a clinically meaningful difference.  A difference of 2-3 points in the total score of the Quality of Life Index has been associated with significant improvement in overall quality of life, self-image, physical symptoms, and general health in studies assessing change in quality of life.  PHQ-9: Review Flowsheet       06/28/2023  Depression screen PHQ 2/9  Decreased Interest 0  Down, Depressed, Hopeless 3  PHQ - 2 Score 3  Altered sleeping 3  Tired, decreased energy 3  Change in appetite 3  Feeling bad or failure about yourself  3  Trouble concentrating 3  Moving slowly or fidgety/restless 0  Suicidal thoughts 0  PHQ-9 Score 18  Difficult doing work/chores Extremely dIfficult    Details           Interpretation of Total Score  Total Score Depression Severity:  1-4 = Minimal depression, 5-9 = Mild depression, 10-14 = Moderate depression, 15-19 = Moderately severe depression, 20-27 = Severe depression   Psychosocial Evaluation and Intervention:  Psychosocial Evaluation - 06/28/23 1337       Psychosocial Evaluation & Interventions   Interventions Stress management education;Relaxation education;Encouraged to exercise with the program and follow exercise prescription    Comments Patient has no psychosocial barriers to participate in CR identified at his orientation visit. His PHQ-9 score was 18. He says he has had depression and anxiety most all of his life. He says he has never tried any treatment and does not want to at this time. He feels like he is able to manage and cope on his own. 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 He is interested in vocational rehab. He lives with his mother. He did state he has been homeless a few years ago but his mother moved inorder to allow him to live with her. He has a 19 year old son. He says  the son's mother moves around a lot and does not allow him to have a relationship with him and this "bothers" him a lot along with his lack of financial income. He says his mother does support him with some of his bills, food, and transportation. He says he looking forward to participating in the program hoping to get stronger and be able to get employment.    Expected Outcomes Patient will continue to have no psychosocial barriers identified.    Continue Psychosocial Services  No Follow up required             Psychosocial  Re-Evaluation:  Psychosocial Re-Evaluation     Row Name 07/08/23 1036             Psychosocial Re-Evaluation   Current issues with History of Depression;Current Anxiety/Panic       Comments Patient is new to the program. He has completed 4 sessions. He continues to have no psychosocial barriers identified. He continues to feels his depression and anxiety are managed without treatment. He seems to enjoy the sessions and demonstrates an interest in improving his health.       Expected Outcomes Patient will continue to have no psychosocial barriers identified.       Interventions Stress management education;Relaxation education;Encouraged to attend Cardiac Rehabilitation for the exercise       Continue Psychosocial Services  No Follow up required                Psychosocial Discharge (Final Psychosocial Re-Evaluation):  Psychosocial Re-Evaluation - 07/08/23 1036       Psychosocial Re-Evaluation   Current issues with History of Depression;Current Anxiety/Panic    Comments Patient is new to the program. He has completed 4 sessions. He continues to have no psychosocial barriers identified. He continues to feels his depression and anxiety are managed without treatment. He seems to enjoy the sessions and demonstrates an interest in improving his health.    Expected Outcomes Patient will continue to have no psychosocial barriers identified.    Interventions Stress  management education;Relaxation education;Encouraged to attend Cardiac Rehabilitation for the exercise    Continue Psychosocial Services  No Follow up required             Vocational Rehabilitation: Provide vocational rehab assistance to qualifying candidates.   Vocational Rehab Evaluation & Intervention:  Vocational Rehab - 07/05/23 1258       Initial Vocational Rehab Evaluation & Intervention   Vocational Rehab Packet given to patient 07/03/23    Documents faxed to [facility] Dept of Vocational Rehabilitation 07/05/23             Education: Education Goals: Education classes will be provided on a weekly basis, covering required topics. Participant will state understanding/return demonstration of topics presented.  Learning Barriers/Preferences:  Learning Barriers/Preferences - 06/28/23 1322       Learning Barriers/Preferences   Learning Barriers None    Learning Preferences Written Material;Audio;Skilled Demonstration             Education Topics: Hypertension, Hypertension Reduction -Define heart disease and high blood pressure. Discus how high blood pressure affects the body and ways to reduce high blood pressure. Flowsheet Row CARDIAC REHAB PHASE II EXERCISE from 07/17/2023 in [place] CARDIAC REHABILITATION  Date 07/03/23  Educator HB  Instruction Review Code 1- Verbalizes Understanding       Exercise and Your Heart -Discuss why it is important to exercise, the FITT principles of exercise, normal and abnormal responses to exercise, and how to exercise safely. Flowsheet Row CARDIAC REHAB PHASE II EXERCISE from 07/17/2023 in [place] CARDIAC REHABILITATION  Date 07/10/23  Educator [facility]  Instruction Review Code 1- Verbalizes Understanding       Angina -Discuss definition of angina, causes of angina, treatment of angina, and how to decrease risk of having angina. Flowsheet Row CARDIAC REHAB PHASE II EXERCISE from 07/17/2023 in [place] CARDIAC  REHABILITATION  Date 07/17/23  Educator HW  Instruction Review Code 1- Verbalizes Understanding       Cardiac Medications -Review what the following cardiac medications are used for, how they affect  the body, and side effects that may occur when taking the medications.  Medications include Aspirin, Beta blockers, calcium channel blockers, ACE Inhibitors, angiotensin receptor blockers, diuretics, digoxin, and antihyperlipidemics.   Congestive Heart Failure -Discuss the definition of CHF, how to live with CHF, the signs and symptoms of CHF, and how keep track of weight and sodium intake.   Heart Disease and Intimacy -Discus the effect sexual activity has on the heart, how changes occur during intimacy as we age, and safety during sexual activity.   Smoking Cessation / COPD -Discuss different methods to quit smoking, the health benefits of quitting smoking, and the definition of COPD.   Nutrition I: Fats -Discuss the types of cholesterol, what cholesterol does to the heart, and how cholesterol levels can be controlled.   Nutrition II: Labels -Discuss the different components of food labels and how to read food label   Heart Parts/Heart Disease and PAD -Discuss the anatomy of the heart, the pathway of blood circulation through the heart, and these are affected by heart disease.   Stress I: Signs and Symptoms -Discuss the causes of stress, how stress may lead to anxiety and depression, and ways to limit stress.   Stress II: Relaxation -Discuss different types of relaxation techniques to limit stress.   Warning Signs of Stroke / TIA -Discuss definition of a stroke, what the signs and symptoms are of a stroke, and how to identify when someone is having stroke.   Knowledge Questionnaire Score:  Knowledge Questionnaire Score - 06/28/23 1320       Knowledge Questionnaire Score   Pre Score 22/24             Core Components/Risk Factors/Patient Goals at Admission:   Personal Goals and Risk Factors at Admission - 06/28/23 1323       Core Components/Risk Factors/Patient Goals on Admission    Weight Management Weight Maintenance    Improve shortness of breath with ADL's Yes    Intervention Provide education, individualized exercise plan and daily activity instruction to help decrease symptoms of SOB with activities of daily living.    Expected Outcomes Short Term: Improve cardiorespiratory fitness to achieve a reduction of symptoms when performing ADLs;Long Term: Be able to perform more ADLs without symptoms or delay the onset of symptoms    Hypertension Yes    Intervention Provide education on lifestyle modifcations including regular physical activity/exercise, weight management, moderate sodium restriction and increased consumption of fresh fruit, vegetables, and low fat dairy, alcohol moderation, and smoking cessation.;Monitor prescription use compliance.    Expected Outcomes Short Term: Continued assessment and intervention until BP is < 140/69mm HG in hypertensive participants. < 130/6mm HG in hypertensive participants with diabetes, heart failure or chronic kidney disease.;Long Term: Maintenance of blood pressure at goal levels.    Lipids Yes    Intervention Provide education and support for participant on nutrition & aerobic/resistive exercise along with prescribed medications to achieve LDL 70mg , HDL >40mg .    Expected Outcomes Short Term: Participant states understanding of desired cholesterol values and is compliant with medications prescribed. Participant is following exercise prescription and nutrition guidelines.;Long Term: Cholesterol controlled with medications as prescribed, with individualized exercise RX and with personalized nutrition plan. Value goals: LDL < 70mg , HDL > 40 mg.    Stress Yes    Intervention Offer individual and/or small group education and counseling on adjustment to heart disease, stress management and health-related lifestyle  change. Teach and support self-help strategies.;Refer participants experiencing significant psychosocial distress to  appropriate mental health specialists for further evaluation and treatment. When possible, include family members and significant others in education/counseling sessions.    Expected Outcomes Short Term: Participant demonstrates changes in health-related behavior, relaxation and other stress management skills, ability to obtain effective social support, and compliance with psychotropic medications if prescribed.;Long Term: Emotional wellbeing is indicated by absence of clinically significant psychosocial distress or social isolation.    Personal Goal Other Yes    Personal Goal Patient wants to improve his strength and stamina; Walk longer distance without getting SOB; and be able to go back to work.    Intervention Patient will attend CR 3 days/week with exercise and education.    Expected Outcomes Patient will complete the program meeting both personal and program goals.             Core Components/Risk Factors/Patient Goals Review:   Goals and Risk Factor Review     Row Name 07/08/23 1038             Core Components/Risk Factors/Patient Goals Review   Personal Goals Review Weight Management/Obesity;Lipids;Hypertension;Stress;Other       Review Patient was referred to CR with [facility]. He has multiple risk factors for CAD and is participating in the program for risk modificaiton. He has completed 4 sessions. His current weight is 155.0 lbs losing 0.7 bls from his intial visit. He is doing well in the program. His blood pressure is at goal. 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 We will continue to monitor his progress as he works towards meeting these goals.       Expected Outcomes Patient will complete the program meeting both personal and program  goals.                Core Components/Risk Factors/Patient Goals at Discharge (Final Review):   Goals and Risk Factor Review - 07/08/23 1038       Core Components/Risk Factors/Patient Goals Review   Personal Goals Review Weight Management/Obesity;Lipids;Hypertension;Stress;Other    Review Patient was referred to CR with [facility]. He has multiple risk factors for CAD and is participating in the program for risk modificaiton. He has completed 4 sessions. His current weight is 155.0 lbs losing 0.7 bls from his intial visit. He is doing well in the program. His blood pressure is at goal. 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 We will continue to monitor his progress as he works towards meeting these goals.    Expected Outcomes Patient will complete the program meeting both personal and program goals.             ITP Comments:  ITP Comments     Row Name 07/05/23 1259 07/17/23 1304         ITP Comments Gave patient vocational rehab packet 07/03/23. He returned it and it was faxed to vocational rehab 07/05/23. 30 day review completed. ITP sent to Dr. Dina Rich, Medical Director of Cardiac Rehab. Continue with ITP unless changes are made by physician.               Comments: 30 day review

## 2023-07-19 ENCOUNTER — Telehealth (HOSPITAL_COMMUNITY): Payer: Self-pay | Admitting: *Deleted

## 2023-07-19 ENCOUNTER — Encounter (HOSPITAL_COMMUNITY): Payer: Medicaid Other

## 2023-07-19 NOTE — Telephone Encounter (Signed)
Unable to hold rehab classes due to IT and monitor outage

## 2023-07-22 ENCOUNTER — Encounter (HOSPITAL_COMMUNITY)
Admission: RE | Admit: 2023-07-22 | Discharge: 2023-07-22 | Disposition: A | Discharge status: Home / Self Care | Payer: Medicaid Other | Source: Ambulatory Visit | Attending: Thoracic Surgery (Cardiothoracic Vascular Surgery) | Admitting: Thoracic Surgery (Cardiothoracic Vascular Surgery)

## 2023-07-22 DIAGNOSIS — I214 Non-ST elevation (NSTEMI) myocardial infarction: Secondary | ICD-10-CM | POA: Diagnosis not present

## 2023-07-22 DIAGNOSIS — Z951 Presence of aortocoronary bypass graft: Secondary | ICD-10-CM

## 2023-07-22 NOTE — Progress Notes (Signed)
Daily Session Note  Patient Details  Name: Caleb Perkins MRN: [id number] Date of Birth: 23-Oct-1976 Referring Provider:   Flowsheet Row CARDIAC REHAB PHASE II ORIENTATION from 06/28/2023 in [facility] CARDIAC REHABILITATION  Referring Provider Dr. Dorris Fetch       Encounter Date: 07/22/2023  Check In:  Session Check In - 07/22/23 1100       Check-In   Supervising physician immediately available to respond to emergencies See telemetry face sheet for immediately available MD    Location [facility]    Staff Present Ross Ludwig, BS, Exercise Physiologist;Daphyne Daphine Deutscher, RN, BSN    Virtual Visit No    Medication changes reported     No    Fall or balance concerns reported    No    Tobacco Cessation No Change    Warm-up and Cool-down Performed on first and last piece of equipment    Resistance Training Performed Yes    VAD Patient? No    PAD/SET Patient? No      Pain Assessment   Currently in Pain? No/denies    Pain Score 0-No pain    Multiple Pain Sites No             Capillary Blood Glucose: No results found for this or any previous visit (from the past 24 hour(s)).    Social History   Tobacco Use  Smoking Status Former   Current packs/day: 0.00   Types: Cigarettes   Quit date: 04/22/2023   Years since quitting: 0.2  Smokeless Tobacco Never    Goals Met:  Independence with exercise equipment Exercise tolerated well No report of concerns or symptoms today Strength training completed today  Goals Unmet:  Not Applicable  Comments: Pt able to follow exercise prescription today without complaint.  Will continue to monitor for progression.    Dr. Dina Rich is Medical Director for [facility] Cardiac Rehab

## 2023-07-24 ENCOUNTER — Encounter (HOSPITAL_COMMUNITY): Admission: RE | Admit: 2023-07-24 | Payer: Medicaid Other | Source: Ambulatory Visit

## 2023-07-24 DIAGNOSIS — Z951 Presence of aortocoronary bypass graft: Secondary | ICD-10-CM

## 2023-07-24 DIAGNOSIS — I214 Non-ST elevation (NSTEMI) myocardial infarction: Secondary | ICD-10-CM

## 2023-07-24 NOTE — Progress Notes (Signed)
Daily Session Note  Patient Details  Name: Caleb Perkins MRN: [id number] Date of Birth: [birth year] Referring Provider:   Flowsheet Row CARDIAC REHAB PHASE II ORIENTATION from 06/28/2023 in [facility] CARDIAC REHABILITATION  Referring Provider Dr. Dorris Fetch       Encounter Date: 07/24/2023  Check In:  Session Check In - 07/24/23 1100       Check-In   Supervising physician immediately available to respond to emergencies See telemetry face sheet for immediately available MD    Location [facility]    Staff Present Ross Ludwig, BS, Exercise Physiologist;Hillary Leonidas Romberg BSN, RN    Virtual Visit No    Medication changes reported     No    Fall or balance concerns reported    No    Tobacco Cessation No Change    Warm-up and Cool-down Performed on first and last piece of equipment    Resistance Training Performed Yes    VAD Patient? No    PAD/SET Patient? No      Pain Assessment   Currently in Pain? No/denies    Pain Score 0-No pain    Multiple Pain Sites No             Capillary Blood Glucose: No results found for this or any previous visit (from the past 24 hour(s)).    Social History   Tobacco Use  Smoking Status Former   Current packs/day: 0.00   Types: Cigarettes   Quit date: 04/22/2023   Years since quitting: 0.2  Smokeless Tobacco Never    Goals Met:  Independence with exercise equipment Exercise tolerated well No report of concerns or symptoms today Strength training completed today  Goals Unmet:  Not Applicable  Comments: Pt able to follow exercise prescription today without complaint.  Will continue to monitor for progression.    Dr. Dina Rich is Medical Director for [facility] Cardiac Rehab

## 2023-07-26 ENCOUNTER — Encounter (HOSPITAL_COMMUNITY)
Admission: RE | Admit: 2023-07-26 | Discharge: 2023-07-26 | Disposition: A | Discharge status: Home / Self Care | Payer: Medicaid Other | Source: Ambulatory Visit | Attending: Thoracic Surgery (Cardiothoracic Vascular Surgery) | Admitting: Thoracic Surgery (Cardiothoracic Vascular Surgery)

## 2023-07-26 DIAGNOSIS — I214 Non-ST elevation (NSTEMI) myocardial infarction: Secondary | ICD-10-CM

## 2023-07-26 DIAGNOSIS — Z951 Presence of aortocoronary bypass graft: Secondary | ICD-10-CM

## 2023-07-26 NOTE — Progress Notes (Signed)
Daily Session Note  Patient Details  Name: Caleb Perkins MRN: [id number] Date of Birth: [birth year] Referring Provider:   Flowsheet Row CARDIAC REHAB PHASE II ORIENTATION from 06/28/2023 in [facility] CARDIAC REHABILITATION  Referring Provider Dr. Dorris Fetch       Encounter Date: 07/26/2023  Check In:  Session Check In - 07/26/23 1104       Check-In   Supervising physician immediately available to respond to emergencies See telemetry face sheet for immediately available MD    Location [facility]    Staff Present Ross Ludwig, BS, Exercise Physiologist;Uriel Dowding [place], MA, RCEP, CCRP, Dow Adolph, RN, BSN    Virtual Visit No    Medication changes reported     No    Fall or balance concerns reported    No    Warm-up and Cool-down Performed on first and last piece of equipment    Resistance Training Performed Yes    VAD Patient? No    PAD/SET Patient? No      Pain Assessment   Currently in Pain? No/denies             Capillary Blood Glucose: No results found for this or any previous visit (from the past 24 hour(s)).    Social History   Tobacco Use  Smoking Status Former   Current packs/day: 0.00   Types: Cigarettes   Quit date: 04/22/2023   Years since quitting: 0.2  Smokeless Tobacco Never    Goals Met:  Independence with exercise equipment Exercise tolerated well Personal goals reviewed No report of concerns or symptoms today Strength training completed today  Goals Unmet:  Not Applicable  Comments: Pt able to follow exercise prescription today without complaint.  Will continue to monitor for progression.    Dr. Erick Blinks is Medical Director for [name].

## 2023-07-29 ENCOUNTER — Encounter (HOSPITAL_COMMUNITY)
Admission: RE | Admit: 2023-07-29 | Discharge: 2023-07-29 | Disposition: A | Discharge status: Home / Self Care | Payer: Medicaid Other | Source: Ambulatory Visit | Attending: Thoracic Surgery (Cardiothoracic Vascular Surgery) | Admitting: Thoracic Surgery (Cardiothoracic Vascular Surgery)

## 2023-07-29 DIAGNOSIS — Z951 Presence of aortocoronary bypass graft: Secondary | ICD-10-CM

## 2023-07-29 DIAGNOSIS — I214 Non-ST elevation (NSTEMI) myocardial infarction: Secondary | ICD-10-CM

## 2023-07-29 NOTE — Progress Notes (Signed)
Daily Session Note  Patient Details  Name: Caleb Perkins MRN: [id number] Date of Birth: June 06, 1976 Referring Provider:   Flowsheet Row CARDIAC REHAB PHASE II ORIENTATION from 06/28/2023 in [facility] CARDIAC REHABILITATION  Referring Provider Dr. Dorris Fetch       Encounter Date: 07/29/2023  Check In:  Session Check In - 07/29/23 1100       Check-In   Supervising physician immediately available to respond to emergencies See telemetry face sheet for immediately available MD    Location [facility]    Staff Present Ross Ludwig, BS, Exercise Physiologist;Daphyne Daphine Deutscher, RN, BSN    Virtual Visit No    Medication changes reported     No    Fall or balance concerns reported    No    Tobacco Cessation No Change    Warm-up and Cool-down Performed on first and last piece of equipment    Resistance Training Performed Yes    VAD Patient? No    PAD/SET Patient? No      Pain Assessment   Currently in Pain? No/denies    Pain Score 0-No pain    Multiple Pain Sites No             Capillary Blood Glucose: No results found for this or any previous visit (from the past 24 hour(s)).    Social History   Tobacco Use  Smoking Status Former   Current packs/day: 0.00   Types: Cigarettes   Quit date: 04/22/2023   Years since quitting: 0.2  Smokeless Tobacco Never    Goals Met:  Independence with exercise equipment Exercise tolerated well No report of concerns or symptoms today Strength training completed today  Goals Unmet:  Not Applicable  Comments: Pt able to follow exercise prescription today without complaint.  Will continue to monitor for progression.    Dr. Dina Rich is Medical Director for [facility] Cardiac Rehab

## 2023-07-31 ENCOUNTER — Encounter (HOSPITAL_COMMUNITY)
Admission: RE | Admit: 2023-07-31 | Discharge: 2023-07-31 | Disposition: A | Discharge status: Home / Self Care | Payer: Medicaid Other | Source: Ambulatory Visit | Attending: Thoracic Surgery (Cardiothoracic Vascular Surgery) | Admitting: Thoracic Surgery (Cardiothoracic Vascular Surgery)

## 2023-07-31 DIAGNOSIS — Z951 Presence of aortocoronary bypass graft: Secondary | ICD-10-CM

## 2023-07-31 DIAGNOSIS — I214 Non-ST elevation (NSTEMI) myocardial infarction: Secondary | ICD-10-CM

## 2023-07-31 NOTE — Progress Notes (Signed)
Daily Session Note  Patient Details  Name: Caleb Perkins MRN: [id number] Date of Birth: 05/23/1976 Referring Provider:   Flowsheet Row CARDIAC REHAB PHASE II ORIENTATION from 06/28/2023 in [facility] CARDIAC REHABILITATION  Referring Provider Dr. Dorris Fetch       Encounter Date: 07/31/2023  Check In:  Session Check In - 07/31/23 1142       Check-In   Supervising physician immediately available to respond to emergencies See telemetry face sheet for immediately available MD    Location [facility]    Staff Present Ross Ludwig, BS, Exercise Physiologist;Yuepheng Schaller Juanetta Gosling, MA, RCEP, CCRP, CCET;Hillary Troutman BSN, RN    Virtual Visit No    Medication changes reported     No    Fall or balance concerns reported    No    Warm-up and Cool-down Performed on first and last piece of equipment    Resistance Training Performed Yes    VAD Patient? No    PAD/SET Patient? No      Pain Assessment   Currently in Pain? Yes    Pain Score 8     Pain Descriptors / Indicators Aching    Pain Type Chronic pain             Capillary Blood Glucose: No results found for this or any previous visit (from the past 24 hour(s)).    Social History   Tobacco Use  Smoking Status Former   Current packs/day: 0.00   Types: Cigarettes   Quit date: 04/22/2023   Years since quitting: 0.2  Smokeless Tobacco Never    Goals Met:  Independence with exercise equipment Exercise tolerated well No report of concerns or symptoms today Strength training completed today  Goals Unmet:  Not Applicable  Comments: Pt able to follow exercise prescription today without complaint.  Will continue to monitor for progression.    Dr. Dina Rich is Medical Director for [facility] Cardiac Rehab

## 2023-08-02 ENCOUNTER — Encounter (HOSPITAL_COMMUNITY)
Admission: RE | Admit: 2023-08-02 | Discharge: 2023-08-02 | Disposition: A | Discharge status: Home / Self Care | Payer: Medicaid Other | Source: Ambulatory Visit | Attending: Thoracic Surgery (Cardiothoracic Vascular Surgery) | Admitting: Thoracic Surgery (Cardiothoracic Vascular Surgery)

## 2023-08-02 DIAGNOSIS — I214 Non-ST elevation (NSTEMI) myocardial infarction: Secondary | ICD-10-CM | POA: Insufficient documentation

## 2023-08-02 DIAGNOSIS — Z951 Presence of aortocoronary bypass graft: Secondary | ICD-10-CM | POA: Diagnosis present

## 2023-08-02 NOTE — Progress Notes (Signed)
Daily Session Note  Patient Details  Name: Caleb Perkins MRN: [id number] Date of Birth: [birth year] Referring Provider:   Flowsheet Row CARDIAC REHAB PHASE II ORIENTATION from 06/28/2023 in [facility] CARDIAC REHABILITATION  Referring Provider Dr. Dorris Fetch       Encounter Date: 08/02/2023  Check In:  Session Check In - 08/02/23 1100       Check-In   Supervising physician immediately available to respond to emergencies See telemetry face sheet for immediately available MD    Location [facility]    Staff Present Ross Ludwig, BS, Exercise Physiologist;Jessica Juanetta Gosling, MA, RCEP, CCRP, Harolyn Rutherford, RN, BSN    Virtual Visit No    Medication changes reported     No    Fall or balance concerns reported    No    Tobacco Cessation No Change    Warm-up and Cool-down Performed on first and last piece of equipment    Resistance Training Performed Yes    VAD Patient? No      Pain Assessment   Currently in Pain? No/denies    Pain Score 0-No pain             Capillary Blood Glucose: No results found for this or any previous visit (from the past 24 hour(s)).    Social History   Tobacco Use  Smoking Status Former   Current packs/day: 0.00   Types: Cigarettes   Quit date: 04/22/2023   Years since quitting: 0.2  Smokeless Tobacco Never    Goals Met:  Independence with exercise equipment Exercise tolerated well No report of concerns or symptoms today Strength training completed today  Goals Unmet:  Not Applicable  Comments: Pt able to follow exercise prescription today without complaint.  Will continue to monitor for progression.    Dr. Erick Blinks is Medical Director for [facility] Pulmonary Rehab.

## 2023-08-05 ENCOUNTER — Encounter (HOSPITAL_COMMUNITY)
Admission: RE | Admit: 2023-08-05 | Discharge: 2023-08-05 | Disposition: A | Discharge status: Home / Self Care | Payer: Medicaid Other | Source: Ambulatory Visit | Attending: Thoracic Surgery (Cardiothoracic Vascular Surgery) | Admitting: Thoracic Surgery (Cardiothoracic Vascular Surgery)

## 2023-08-05 DIAGNOSIS — Z951 Presence of aortocoronary bypass graft: Secondary | ICD-10-CM

## 2023-08-05 DIAGNOSIS — I214 Non-ST elevation (NSTEMI) myocardial infarction: Secondary | ICD-10-CM

## 2023-08-05 NOTE — Progress Notes (Signed)
Daily Session Note  Patient Details  Name: Caleb Perkins MRN: [id number] Date of Birth: [birth year] Referring Provider:   Flowsheet Row CARDIAC REHAB PHASE II ORIENTATION from 06/28/2023 in [facility] CARDIAC REHABILITATION  Referring Provider Dr. Dorris Fetch       Encounter Date: 08/05/2023  Check In:  Session Check In - 08/05/23 1138       Check-In   Supervising physician immediately available to respond to emergencies See telemetry face sheet for immediately available ER MD    Location [facility]    Staff Present Erskine Speed, Margarite Gouge, RN, BSN    Virtual Visit No    Medication changes reported     No    Fall or balance concerns reported    No    Tobacco Cessation No Change    Warm-up and Cool-down Performed on first and last piece of equipment    Resistance Training Performed Yes    VAD Patient? No    PAD/SET Patient? No      Pain Assessment   Currently in Pain? No/denies             Capillary Blood Glucose: No results found for this or any previous visit (from the past 24 hour(s)).    Social History   Tobacco Use  Smoking Status Former   Current packs/day: 0.00   Types: Cigarettes   Quit date: 04/22/2023   Years since quitting: 0.2  Smokeless Tobacco Never    Goals Met:  Independence with exercise equipment Exercise tolerated well No report of concerns or symptoms today  Goals Unmet:  Not Applicable  Comments: Pt able to follow exercise prescription today without complaint.  Will continue to monitor for progression.    Dr. Dina Rich is Medical Director for [facility] Cardiac Rehab

## 2023-08-06 ENCOUNTER — Other Ambulatory Visit (HOSPITAL_COMMUNITY)
Admission: RE | Admit: 2023-08-06 | Discharge: 2023-08-06 | Disposition: A | Discharge status: Home / Self Care | Payer: Medicaid Other | Source: Ambulatory Visit | Attending: Nurse Practitioner | Admitting: Nurse Practitioner

## 2023-08-06 DIAGNOSIS — I255 Ischemic cardiomyopathy: Secondary | ICD-10-CM | POA: Diagnosis present

## 2023-08-06 LAB — HEPATIC FUNCTION PANEL
ALT: 23 U/L (ref 0–44)
AST: 21 U/L (ref 15–41)
Albumin: 4.2 g/dL (ref 3.5–5.0)
Alkaline Phosphatase: 63 U/L (ref 38–126)
Bilirubin, Direct: 0.1 mg/dL (ref 0.0–0.2)
Indirect Bilirubin: 0.6 mg/dL (ref 0.3–0.9)
Total Bilirubin: 0.7 mg/dL (ref 0.3–1.2)
Total Protein: 7.3 g/dL (ref 6.5–8.1)

## 2023-08-06 LAB — LIPID PANEL
Cholesterol: 125 mg/dL (ref 0–200)
HDL: 38 mg/dL — ABNORMAL LOW (ref >40)
LDL Cholesterol: 72 mg/dL (ref 0–99)
Total CHOL/HDL Ratio: 3.3 RATIO
Triglycerides: 77 mg/dL (ref <150)
VLDL: 15 mg/dL (ref 0–40)

## 2023-08-06 LAB — TSH: TSH: 2.352 u[IU]/mL (ref 0.350–4.500)

## 2023-08-06 LAB — BASIC METABOLIC PANEL
Anion gap: 11 (ref 5–15)
BUN: 22 mg/dL — ABNORMAL HIGH (ref 6–20)
CO2: 23 mmol/L (ref 22–32)
Calcium: 9.1 mg/dL (ref 8.9–10.3)
Chloride: 104 mmol/L (ref 98–111)
Creatinine, Ser: 0.98 mg/dL (ref 0.61–1.24)
GFR, Estimated: >60 mL/min (ref >60)
Glucose, Bld: 105 mg/dL — ABNORMAL HIGH (ref 70–99)
Potassium: 3.8 mmol/L (ref 3.5–5.1)
Sodium: 138 mmol/L (ref 135–145)

## 2023-08-07 ENCOUNTER — Encounter (HOSPITAL_COMMUNITY): Payer: Self-pay | Admitting: Radiology

## 2023-08-07 ENCOUNTER — Encounter (HOSPITAL_COMMUNITY): Admission: RE | Admit: 2023-08-07 | Payer: Medicaid Other | Source: Ambulatory Visit

## 2023-08-07 ENCOUNTER — Ambulatory Visit (HOSPITAL_COMMUNITY)
Admission: RE | Admit: 2023-08-07 | Discharge: 2023-08-07 | Disposition: A | Discharge status: Home / Self Care | Payer: Medicaid Other | Source: Ambulatory Visit | Attending: Nurse Practitioner | Admitting: Nurse Practitioner

## 2023-08-07 DIAGNOSIS — R9389 Abnormal findings on diagnostic imaging of other specified body structures: Secondary | ICD-10-CM | POA: Diagnosis present

## 2023-08-07 DIAGNOSIS — I214 Non-ST elevation (NSTEMI) myocardial infarction: Secondary | ICD-10-CM

## 2023-08-07 DIAGNOSIS — Z951 Presence of aortocoronary bypass graft: Secondary | ICD-10-CM

## 2023-08-07 MED ORDER — IOHEXOL 300 MG/ML  SOLN
75.0000 mL | Freq: Once | INTRAMUSCULAR | Status: AC | PRN
  Administered 2023-08-07: 75 mL via INTRAVENOUS

## 2023-08-07 NOTE — Progress Notes (Signed)
Daily Session Note  Patient Details  Name: Caleb Perkins MRN: [id number] Date of Birth: [birth year] Referring Provider:   Flowsheet Row CARDIAC REHAB PHASE II ORIENTATION from 06/28/2023 in [facility] CARDIAC REHABILITATION  Referring Provider Dr. Dorris Fetch       Encounter Date: 08/07/2023  Check In:  Session Check In - 08/07/23 1100       Check-In   Supervising physician immediately available to respond to emergencies See telemetry face sheet for immediately available ER MD    Location [facility]    Staff Present Fabio Pierce, MA, RCEP, CCRP, Harolyn Rutherford, RN, Neal Dy, RN, BSN    Medication changes reported     No    Fall or balance concerns reported    No    Warm-up and Cool-down Performed on first and last piece of equipment    Resistance Training Performed Yes    VAD Patient? No    PAD/SET Patient? No      Pain Assessment   Currently in Pain? No/denies    Pain Score 0-No pain    Multiple Pain Sites No             Capillary Blood Glucose: No results found for this or any previous visit (from the past 24 hour(s)).    Social History   Tobacco Use  Smoking Status Former   Current packs/day: 0.00   Types: Cigarettes   Quit date: 04/22/2023   Years since quitting: 0.2  Smokeless Tobacco Never    Goals Met:  Independence with exercise equipment Exercise tolerated well No report of concerns or symptoms today Strength training completed today  Goals Unmet:  Not Applicable  Comments: Check out 1200.   Dr. Dina Rich is Medical Director for [facility] Cardiac Rehab

## 2023-08-09 ENCOUNTER — Encounter (HOSPITAL_COMMUNITY)
Admission: RE | Admit: 2023-08-09 | Discharge: 2023-08-09 | Disposition: A | Discharge status: Home / Self Care | Payer: Medicaid Other | Source: Ambulatory Visit | Attending: Thoracic Surgery (Cardiothoracic Vascular Surgery) | Admitting: Thoracic Surgery (Cardiothoracic Vascular Surgery)

## 2023-08-09 DIAGNOSIS — I214 Non-ST elevation (NSTEMI) myocardial infarction: Secondary | ICD-10-CM | POA: Diagnosis not present

## 2023-08-09 DIAGNOSIS — Z951 Presence of aortocoronary bypass graft: Secondary | ICD-10-CM

## 2023-08-09 NOTE — Progress Notes (Signed)
Daily Session Note  Patient Details  Name: Caleb Perkins MRN: [id number] Date of Birth: 05/16/1976 Referring Provider:   Flowsheet Row CARDIAC REHAB PHASE II ORIENTATION from 06/28/2023 in [facility] CARDIAC REHABILITATION  Referring Provider Dr. Dorris Fetch       Encounter Date: 08/09/2023  Check In:  Session Check In - 08/09/23 1104       Check-In   Supervising physician immediately available to respond to emergencies See telemetry face sheet for immediately available ER MD    Location [facility]    Staff Present Rodena Medin, RN, Pleas Koch, RN, BSN    Virtual Visit No    Medication changes reported     No    Fall or balance concerns reported    No    Warm-up and Cool-down Performed on first and last piece of equipment    Resistance Training Performed Yes    VAD Patient? No    PAD/SET Patient? No      Pain Assessment   Currently in Pain? No/denies    Pain Score 0-No pain    Multiple Pain Sites No             Capillary Blood Glucose: No results found for this or any previous visit (from the past 24 hour(s)).    Social History   Tobacco Use  Smoking Status Former   Current packs/day: 0.00   Types: Cigarettes   Quit date: 04/22/2023   Years since quitting: 0.2  Smokeless Tobacco Never    Goals Met:  Independence with exercise equipment Exercise tolerated well No report of concerns or symptoms today Strength training completed today  Goals Unmet:  Not Applicable  Comments: Pt able to follow exercise prescription today without complaint.  Will continue to monitor for progression.    Dr. Dina Rich is Medical Director for [facility] Cardiac Rehab

## 2023-08-12 ENCOUNTER — Encounter (HOSPITAL_COMMUNITY)
Admission: RE | Admit: 2023-08-12 | Discharge: 2023-08-12 | Disposition: A | Discharge status: Home / Self Care | Payer: Medicaid Other | Source: Ambulatory Visit | Attending: Thoracic Surgery (Cardiothoracic Vascular Surgery) | Admitting: Thoracic Surgery (Cardiothoracic Vascular Surgery)

## 2023-08-12 DIAGNOSIS — Z951 Presence of aortocoronary bypass graft: Secondary | ICD-10-CM

## 2023-08-12 DIAGNOSIS — I214 Non-ST elevation (NSTEMI) myocardial infarction: Secondary | ICD-10-CM | POA: Diagnosis not present

## 2023-08-12 NOTE — Progress Notes (Signed)
Daily Session Note  Patient Details  Name: Caleb Perkins MRN: [id number] Date of Birth: 1976/05/16 Referring Provider:   Flowsheet Row CARDIAC REHAB PHASE II ORIENTATION from 06/28/2023 in [facility] CARDIAC REHABILITATION  Referring Provider Dr. Dorris Fetch       Encounter Date: 08/12/2023  Check In:  Session Check In - 08/12/23 1045       Check-In   Supervising physician immediately available to respond to emergencies See telemetry face sheet for immediately available MD    Location [facility]    Staff Present Ross Ludwig, BS, Exercise Physiologist;Debra Laural Benes, RN, Pleas Koch, RN, BSN    Virtual Visit No    Medication changes reported     No    Fall or balance concerns reported    No    Tobacco Cessation No Change    Warm-up and Cool-down Performed on first and last piece of equipment    Resistance Training Performed Yes    VAD Patient? No      Pain Assessment   Currently in Pain? No/denies             Capillary Blood Glucose: No results found for this or any previous visit (from the past 24 hour(s)).    Social History   Tobacco Use  Smoking Status Former   Current packs/day: 0.00   Types: Cigarettes   Quit date: 04/22/2023   Years since quitting: 0.3  Smokeless Tobacco Never    Goals Met:  Independence with exercise equipment Exercise tolerated well No report of concerns or symptoms today Strength training completed today  Goals Unmet:  Not Applicable  Comments: Pt able to follow exercise prescription today without complaint.  Will continue to monitor for progression.    Dr. Dina Rich is Medical Director for [facility]

## 2023-08-14 ENCOUNTER — Encounter (HOSPITAL_COMMUNITY): Payer: Self-pay | Admitting: *Deleted

## 2023-08-14 ENCOUNTER — Encounter (HOSPITAL_COMMUNITY): Payer: Medicaid Other

## 2023-08-14 ENCOUNTER — Ambulatory Visit (HOSPITAL_COMMUNITY)
Admission: RE | Admit: 2023-08-14 | Discharge: 2023-08-14 | Disposition: A | Discharge status: Home / Self Care | Payer: Medicaid Other | Source: Ambulatory Visit | Attending: Nurse Practitioner | Admitting: Nurse Practitioner

## 2023-08-14 DIAGNOSIS — I255 Ischemic cardiomyopathy: Secondary | ICD-10-CM | POA: Insufficient documentation

## 2023-08-14 DIAGNOSIS — I214 Non-ST elevation (NSTEMI) myocardial infarction: Secondary | ICD-10-CM

## 2023-08-14 DIAGNOSIS — Z951 Presence of aortocoronary bypass graft: Secondary | ICD-10-CM

## 2023-08-14 LAB — ECHOCARDIOGRAM COMPLETE
Area-P 1/2: 3.91 cm2
S' Lateral: 3 cm

## 2023-08-14 NOTE — Progress Notes (Signed)
Office Visit    Patient Name: Caleb Perkins Date of Encounter: 08/15/2023 PCP:  [facility]onc[facility]  Cardiologist:  Dina Rich, MD  Advanced Practice Provider:  Sharlene Dory, NP Electrophysiologist:  None   Chief Complaint and HPI    Caleb Perkins is a 47 y.o. male with a hx of CAD, s/p NSTEMI and CABG, hypertension, HLD, tobacco abuse, marijuana abuse, ischemic cardiomyopathy, PAF, COPD, ascending thoracic aortic aneurysm, who presents today for 3 month follow-up.  Presented to [facility] with worsening shortness of breath and chest pain on April 19, 2023.  Was found to have hypertensive emergency with blood pressure 228/156.  Troponin maximum 102, BNP 495.  Received IV labetalol.  CTA negative for aortic dissection, noted to have mild dilation of ascending aorta measuring 4.1 cm and intralobular septal thickening in both lungs, also noted to have small right pleural effusion with patchy groundglass opacities in right upper and lower lobes. TTE EF 35 to 40%, LV with global hypokinesis, mild AI, MR, and TR, and aortic dilatation of 40 mm.  Underwent LHC, revealed multivessel CAD - see full report below. Underwent CABG x 5 utilizing SVG sequential to OM1 and OM 2, LIMA to LAD, SVG to diagonal, and radial artery to PDA. Developed A-fib on postop day 3, started on oral amiodarone loading, also started on Entresto and Farxiga for his systolic heart failure.  Remained in sinus rhythm and had bursts of A-fib with RVR and NSVT.  Converted later on to normal sinus rhythm with bolus of amiodarone, oral amiodarone was continued.  Started on Eliquis for stroke prevention.  I last saw him for hospital follow-up with his mother on May 14, 2023. Was doing well, but did admit to some  dizziness occasionally with prolonged hot showers, but denies any orthostatic symptoms. He had stopped smoking cigarettes.   Today he presents for 3 month follow-up. Doing very well.  Enjoys going to cardiac rehab and has been progressing well. Compliant with his medications. Denies any chest pain, shortness of breath, palpitations, syncope, presyncope, dizziness, orthopnea, PND, swelling or significant weight changes, acute bleeding, or claudication. Does admit to some BP readings as around 90/70 recently.  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 in [place]  EKGs/Labs/Other Studies Reviewed:   The following studies were reviewed today:  Echo 08/2023:  1. Left ventricular ejection fraction, by estimation, is 60 to 65% with  apical hypokinesis. The left ventricle has normal function. Left  ventricular diastolic parameters were normal.   2. Right ventricular systolic function is normal. The right ventricular  size is normal. There is normal pulmonary artery systolic pressure.   3. Left atrial size was mildly dilated.   4. Right atrial size was mildly dilated.   5. The mitral valve is normal in structure. Trivial mitral valve  regurgitation. No evidence of mitral stenosis.   6. The aortic valve was not well visualized. Aortic valve regurgitation  is not visualized. No aortic stenosis is present.   7. Aortic dilatation noted. There is borderline dilatation of the aortic  root, measuring 39 mm.   8. The inferior vena cava is normal in size with <50% respiratory  variability, suggesting right atrial pressure of 8 mmHg.   Comparison(s): Changes from prior study are noted. LVEF improved from  35-40% in 04/2023 to 60-65% with apical hypokinesis now.  TEE Post-op 04/25/2023:  Complications: No known complications during this  procedure.  POST-OP IMPRESSIONS  _ Left Ventricle: LVEF marginally improved, 40-45%. Some improved wall  motion on  the septum.  _ Right Ventricle: The right ventricle appears unchanged from pre-bypass.  _ Aorta: The aorta appears unchanged from pre-bypass.  _ Left Atrial Appendage: The left atrial appendage  appears unchanged from  pre-bypass.  _ Mitral Valve: The mitral valve appears unchanged from pre-bypass.  _ Tricuspid Valve: The tricuspid valve appears unchanged from pre-bypass.  _ Pulmonic Valve: The pulmonic valve appears unchanged from pre-bypass.  _ Interatrial Septum: The interatrial septum appears unchanged from  pre-bypass.   PRE-OP FINDINGS   Left Ventricle: The left ventricle has moderately reduced systolic  function, with an ejection fraction of 35-40%. The cavity size was mildly  dilated. There is moderate eccentric left ventricular hypertrophy.  Doppler 04/2023:  Summary:  Right Carotid: Velocities in the right ICA are consistent with a 1-39%  stenosis.   Left Carotid: Velocities in the left ICA are consistent with a 1-39%  stenosis.  Vertebrals: Bilateral vertebral arteries demonstrate antegrade flow.  Subclavians: Normal flow hemodynamics were seen in bilateral subclavian               arteries.   Right ABI: Triphasic PTA, multiphasic DPA.  Left ABI: Triphasic PTA and DPA.    Right Upper Extremity: Doppler waveforms remain within normal limits with  right radial compression. Doppler waveforms remain within normal limits  with right ulnar compression.  Left Upper Extremity: Doppler waveforms remain within normal limits with  left radial compression. Doppler waveforms remain within normal limits  with left ulnar compression.     EKG:  EKG is not ordered today.    Right and left heart cath 04/2023: Conclusions: Severe three-vessel coronary artery disease, as detailed below, including sequential 95% ostial and 80-90% mid LAD stenoses, 60% OM1 lesion, 70-80% mid/distal LCx disease, and chronic total occlusion of proximal/mid RCA with left-to-right and right-to-right collaterals. Moderately reduced left ventricular systolic function (LVEF 35-45%). Normal left heart, right heart, and pulmonary artery pressures. Normal Fick cardiac output/index.    Recommendations: Gentle post-catheterization hydration; aim for net even fluid balance. Escalate goal directed medical therapy for acute HFrEF; I will add Entresto 24-26 mg BID beginning this evening. Cardiac surgery consultation for CABG. Aggressive secondary prevention of coronary artery disease. Start heparin infusion 2 hours after TR band removal in the setting of severe multivessel CAD and elevated troponin this admission.  Echo 04/2023: 1. Left ventricular ejection fraction, by estimation, is 35 to 40%. The  left ventricle has moderately decreased function. The left ventricle  demonstrates global hypokinesis. There is moderate left ventricular  hypertrophy. Left ventricular diastolic  parameters are consistent with Grade II diastolic dysfunction  (pseudonormalization). Elevated left atrial pressure.   2. Right ventricular systolic function is normal. The right ventricular  size is normal. There is severely elevated pulmonary artery systolic  pressure.   3. Left atrial size was mildly dilated.   4. Right atrial size was mildly dilated.   5. The mitral valve is abnormal. Mild mitral valve regurgitation. No  evidence of mitral stenosis.   6. The tricuspid valve is abnormal.   7. The aortic valve is tricuspid. Aortic valve regurgitation is mild. No  aortic stenosis is present.   8. Aortic dilatation noted. There is mild dilatation of the ascending  aorta, measuring 40 mm.   9. The inferior vena cava is dilated in size with <50% respiratory  variability, suggesting right atrial pressure of 15  mmHg.  Risk Assessment/Calculations:   CHA2DS2-VASc Score = 3  This indicates a 3.2% annual risk of stroke. The patient's score is based upon: CHF History: 1 HTN History: 1 Diabetes History: 0 Stroke History: 0 Vascular Disease History: 1 Age Score: 0 Gender Score: 0   Review of Systems    All other systems reviewed and are otherwise negative except as noted above.  Physical Exam     VS:  BP 118/74   Pulse 68   Ht 5\' 10"  (1.778 m)   Wt 160 lb 9.6 oz (72.8 kg)   SpO2 97%   BMI 23.04 kg/m  , BMI Body mass index is 23.04 kg/m.  Wt Readings from Last 3 Encounters:  08/15/23 160 lb 9.6 oz (72.8 kg)  07/01/23 155 lb 6.8 oz (70.5 kg)  06/28/23 154 lb 1.6 oz (69.9 kg)    GEN: Thin, 47 y.o. male in no acute distress. HEENT: normal. Neck: Supple, no JVD, carotid bruits, or masses. Cardiac: S1/S2, RRR, no murmurs, rubs, or gallops. No clubbing, cyanosis, edema.  Radials/PT 2+ and equal bilaterally.  Respiratory:  Respirations regular and unlabored, clear to auscultation bilaterally. MS: No deformity or atrophy. Skin: warm and dry, no rash. Neuro:  Strength and sensation are intact. Psych: Normal affect.  Assessment & Plan    CAD, s/p NSTEMI and CABG x 5 (04/2023) Stable with no anginal symptoms. Doing very well. Continue current medication regimen. Continue cardiac rehab. Heart healthy diet and regular cardiovascular exercise encouraged. Smoking cessation/THC cessation encouraged and discussed. Congratulated him that he has quit smoking nicotine. No medication changes at this time.   2. Post-op A-fib Had post-op A-fib after CABG. Currently in NSR on exam. Continue Coreg (will lower dose to 12.5 mg BID) and Eliquis for stroke prophylaxis (on appropriate dosage and denies any bleeding issues). Will stop amiodarone at this time. Currently on appropriate dosage and denies any bleeding issues. Continue to follow with PCP and encouraged annual eye exams.   3. Ischemic cardiomyopathy, HFimpEF TTE pre-CABG revealed EF 35-40%, post CABG TEE revealed EF 40-45%. Recent recovery in EF seen on recent Echo. Euvolemic and well compensated on exam. Will reduce Coreg to 12.5 mg BID to improve his BP readings. Continue Farxiga, Lasix as needed, Imdur, Entresto, Aldactone, potassium supplement. Low sodium diet, fluid restriction <2L, and daily weights encouraged. Educated to contact our  office for weight gain of 2 lbs overnight or 5 lbs in one week.  4. HTN BP stable, does admit to some hypotensive readings. Reducing Coreg to 12.5 mg BID. Continue rest of medication regimen. Discussed to monitor BP at home at least 2 hours after medications and sitting for 5-10 minutes. Heart healthy diet and regular cardiovascular exercise encouraged.   5. HLD, medication management Recent LDL 72.  LDL goal < 55. Will atorvastatin to 80 mg daily. Will obtain FLP and LFT in 6-8 weeks per protocol. Heart healthy diet and regular cardiovascular exercise encouraged.   5. Hx of ascending thoracic aortic aneurysm Recent CT scan of chest showed normal caliber thoracic aorta with mild atherosclerotic disease.  Echocardiogram revealed borderline dilatation aortic root, measuring 39 mm. Discussed results with him. Has quit smoking. Discussed conservative measures and he verbalizes understanding. Recommend repeating CT angio in 1 year for monitoring.   7. COPD Denies any SHOB or acute exacerbation. No medication changes. Continue to follow with PCP.  Follow up in 6 months with Dina Rich, MD or APP.  Signed, Sharlene Dory, NP 08/16/2023, 9:38 AM  [facility]

## 2023-08-14 NOTE — Progress Notes (Signed)
*  PRELIMINARY RESULTS* Echocardiogram 2D Echocardiogram has been performed.  Stacey Drain 08/14/2023, 12:21 PM

## 2023-08-14 NOTE — Progress Notes (Signed)
Cardiac Individual Treatment Plan  Patient Details  Name: Caleb Perkins MRN: [id number] Date of Birth: [birth year] Referring Provider:   Flowsheet Row CARDIAC REHAB PHASE II ORIENTATION from 06/28/2023 in [facility] CARDIAC REHABILITATION  Referring Provider Dr. Dorris Fetch       Initial Encounter Date:  Flowsheet Row CARDIAC REHAB PHASE II ORIENTATION from 06/28/2023 in [place] CARDIAC REHABILITATION  Date 06/28/23       Visit Diagnosis: S/P CABG x 5  NSTEMI (non-ST elevated myocardial infarction) (HCC)  Patient's Home Medications on Admission:  Current Outpatient Medications:    albuterol (VENTOLIN HFA) 108 (90 Base) MCG/ACT inhaler, Inhale 2 puffs into the lungs every 6 (six) hours as needed for wheezing or shortness of breath., Disp: 18 g, Rfl: 2   amiodarone (PACERONE) 200 MG tablet, Take 0.5 tablets (100 mg total) by mouth daily., Disp: 30 tablet, Rfl: 0   apixaban (ELIQUIS) 5 MG TABS tablet, Take 1 tablet (5 mg total) by mouth 2 (two) times daily. (Patient taking differently: Take 5 mg by mouth 2 (two) times daily. Take 1 tablet (5 mg) by mouth 2 (two) times daily. Total of 10 mg daily), Disp: 60 tablet, Rfl: 11   aspirin EC 81 MG tablet, Take 1 tablet (81 mg total) by mouth daily. Swallow whole., Disp: 30 tablet, Rfl: 11   atorvastatin (LIPITOR) 40 MG tablet, Take 1 tablet (40 mg total) by mouth daily., Disp: 30 tablet, Rfl: 11   carvedilol (COREG) 25 MG tablet, Take 1 tablet (25 mg total) by mouth 2 (two) times daily with a meal., Disp: 60 tablet, Rfl: 11   dapagliflozin propanediol (FARXIGA) 10 MG TABS tablet, Take 1 tablet (10 mg total) by mouth daily., Disp: 30 tablet, Rfl: 11   Fluticasone-Salmeterol (ADVAIR DISKUS) 250-50 MCG/DOSE AEPB, Inhale 1 puff into the lungs 2 (two) times daily. (Patient taking differently: Inhale 1 puff into the lungs as needed.), Disp: 60 each, Rfl: 2   furosemide (LASIX) 40 MG tablet, Take 1 tablet (40 mg total) by mouth daily as needed for  edema (Weight gain of 3lbs or more in 24 hours or 5lbs or more in 48 hours)., Disp: 30 tablet, Rfl: 11   isosorbide mononitrate (IMDUR) 30 MG 24 hr tablet, Take 1 tablet (30 mg total) by mouth daily., Disp: 30 tablet, Rfl: 11   oxyCODONE (OXY IR/ROXICODONE) 5 MG immediate release tablet, Take 1 tablet (5 mg total) by mouth every 6 (six) hours as needed for severe pain., Disp: 28 tablet, Rfl: 0   potassium chloride SA (KLOR-CON M) 20 MEQ tablet, Take 1 tablet (20 mEq total) by mouth daily as needed (When you take Lasix)., Disp: 30 tablet, Rfl: 5   sacubitril-valsartan (ENTRESTO) 49-51 MG, Take 1 tablet by mouth 2 (two) times daily., Disp: 60 tablet, Rfl: 11   spironolactone (ALDACTONE) 25 MG tablet, Take 0.5 tablets (12.5 mg total) by mouth daily., Disp: 15 tablet, Rfl: 11  Past Medical History: Past Medical History:  Diagnosis Date   Blood transfusion without reported diagnosis    Hypertension     Tobacco Use: Social History   Tobacco Use  Smoking Status Former   Current packs/day: 0.00   Types: Cigarettes   Quit date: 04/22/2023   Years since quitting: 0.3  Smokeless Tobacco Never    Labs: Review Flowsheet       Latest Ref Rng & Units 04/20/2023 04/22/2023 04/25/2023 08/06/2023  Labs for ITP Cardiac and Pulmonary Rehab  Cholestrol 0 - 200 mg/dL 409  - -  125   LDL (calc) 0 - 99 mg/dL 284  - - 72   HDL-C >13 mg/dL 35  - - 38   Trlycerides <150 mg/dL 244  - - 77   Hemoglobin A1c 4.8 - 5.6 % 5.2  - - -  PH, Arterial 7.35 - 7.45 - 7.369  7.280  7.327  7.337  7.297  7.304  7.307  -  PCO2 arterial 32 - 48 mmHg - 40.6  42.2  38.9  37.2  40.9  39.2  40.5  -  Bicarbonate 20.0 - 28.0 mmol/L - 23.4  23.9  20.0  20.5  20.1  20.0  22.1  19.4  20.2  -  TCO2 22 - 32 mmol/L - 25  25  21  22  21  21  21  23  24  23  21  23  23  21   -  Acid-base deficit 0.0 - 2.0 mmol/L - 2.0  2.0  7.0  5.0  5.0  6.0  4.0  6.0  6.0  -  O2 Saturation % - 99  75  97  96  91  99  83  100  100  -    Details        Multiple values from one day are sorted in reverse-chronological order         Capillary Blood Glucose: Lab Results  Component Value Date   GLUCAP 101 (H) 04/28/2023   GLUCAP 98 04/28/2023   GLUCAP 145 (H) 04/28/2023   GLUCAP 161 (H) 04/27/2023   GLUCAP 119 (H) 04/27/2023     Exercise Target Goals: Exercise Program Goal: Individual exercise prescription set using results from initial 6 min walk test and THRR while considering  patient's activity barriers and safety.   Exercise Prescription Goal: Starting with aerobic activity 30 plus minutes a day, 3 days per week for initial exercise prescription. Provide home exercise prescription and guidelines that participant acknowledges understanding prior to discharge.  Activity Barriers & Risk Stratification:  Activity Barriers & Cardiac Risk Stratification - 06/28/23 1232       Activity Barriers & Cardiac Risk Stratification   Activity Barriers Shortness of Breath;Back Problems;Incisional Pain    Cardiac Risk Stratification High             6 Minute Walk:  6 Minute Walk     Row Name 06/28/23 1338         6 Minute Walk   Phase Initial     Distance 1350 feet     Walk Time 6 minutes     # of Rest Breaks 0     MPH 2.55     METS 4.39     RPE 12     VO2 Peak 15.37     Symptoms No     Resting HR 69 bpm     Resting BP 102/76     Resting Oxygen Saturation  97 %     Exercise Oxygen Saturation  during 6 min walk 97 %     Max Ex. HR 66 bpm     Max Ex. BP 116/70     2 Minute Post BP 108/70              Oxygen Initial Assessment:   Oxygen Re-Evaluation:   Oxygen Discharge (Final Oxygen Re-Evaluation):   Initial Exercise Prescription:  Initial Exercise Prescription - 06/28/23 1300       Date of Initial Exercise RX and Referring Provider  Date 06/28/23    Referring Provider Dr. Dorris Fetch    Expected Discharge Date 09/18/23      Treadmill   MPH 1.4    Grade 0    Minutes 17      NuStep   Level 1     SPM 60    Minutes 22      Prescription Details   Frequency (times per week) 3    Duration Progress to 30 minutes of continuous aerobic without signs/symptoms of physical distress      Intensity   THRR 40-80% of Max Heartrate 70-141    Ratings of Perceived Exertion 11-13      Resistance Training   Training Prescription Yes    Weight 4    Reps 10-15             Perform Capillary Blood Glucose checks as needed.  Exercise Prescription Changes:   Exercise Prescription Changes     Row Name 07/01/23 1200 07/12/23 1100 07/15/23 1500 07/29/23 1300       Response to Exercise   Blood Pressure (Admit) 108/70 -- 104/74 104/64    Blood Pressure (Exercise) 126/68 -- 110/74 116/68    Blood Pressure (Exit) 108/70 -- 102/60 100/56    Heart Rate (Admit) 59 bpm -- 70 bpm 74 bpm    Heart Rate (Exercise) 75 bpm -- 76 bpm 90 bpm    Heart Rate (Exit) 67 bpm -- 61 bpm 83 bpm    Rating of Perceived Exertion (Exercise) 12 -- 12 12    Duration Continue with 30 min of aerobic exercise without signs/symptoms of physical distress. -- Continue with 30 min of aerobic exercise without signs/symptoms of physical distress. Continue with 30 min of aerobic exercise without signs/symptoms of physical distress.    Intensity THRR unchanged -- THRR unchanged THRR unchanged      Progression   Progression Continue to progress workloads to maintain intensity without signs/symptoms of physical distress. -- Continue to progress workloads to maintain intensity without signs/symptoms of physical distress. Continue to progress workloads to maintain intensity without signs/symptoms of physical distress.      Resistance Training   Training Prescription Yes -- Yes Yes    Weight 4 -- 4 4    Reps 10-15 -- 10-15 10-15    Time 10 Minutes -- -- --      Treadmill   MPH 1.4 -- 1.5 2    Grade 0 -- 0.5 1.5    Minutes 17 -- 15 15    METs 2.07 -- 2.25 2.95      NuStep   Level 1 -- 2 4    SPM 54 -- 73 89    Minutes 22  -- 15 15    METs 1.73 -- 2 2.95      Home Exercise Plan   Plans to continue exercise at -- Home (comment)  walking, weights -- --    Frequency -- Add 2 additional days to program exercise sessions. -- --    Initial Home Exercises Provided -- 07/12/23 -- --      Oxygen   Maintain Oxygen Saturation -- -- 88% or higher 88% or higher             Exercise Comments:   Exercise Goals and Review:   Exercise Goals     Row Name 06/28/23 1340 07/15/23 1553           Exercise Goals   Increase Physical Activity Yes Yes  Intervention Provide advice, education, support and counseling about physical activity/exercise needs.;Develop an individualized exercise prescription for aerobic and resistive training based on initial evaluation findings, risk stratification, comorbidities and participant's personal goals. Provide advice, education, support and counseling about physical activity/exercise needs.;Develop an individualized exercise prescription for aerobic and resistive training based on initial evaluation findings, risk stratification, comorbidities and participant's personal goals.      Expected Outcomes Short Term: Attend rehab on a regular basis to increase amount of physical activity.;Long Term: Add in home exercise to make exercise part of routine and to increase amount of physical activity.;Long Term: Exercising regularly at least 3-5 days a week. Short Term: Attend rehab on a regular basis to increase amount of physical activity.;Long Term: Add in home exercise to make exercise part of routine and to increase amount of physical activity.;Long Term: Exercising regularly at least 3-5 days a week.      Increase Strength and Stamina Yes Yes      Intervention Provide advice, education, support and counseling about physical activity/exercise needs.;Develop an individualized exercise prescription for aerobic and resistive training based on initial evaluation findings, risk stratification,  comorbidities and participant's personal goals. Provide advice, education, support and counseling about physical activity/exercise needs.;Develop an individualized exercise prescription for aerobic and resistive training based on initial evaluation findings, risk stratification, comorbidities and participant's personal goals.      Expected Outcomes Short Term: Increase workloads from initial exercise prescription for resistance, speed, and METs.;Short Term: Perform resistance training exercises routinely during rehab and add in resistance training at home;Long Term: Improve cardiorespiratory fitness, muscular endurance and strength as measured by increased METs and functional capacity ( ) Short Term: Increase workloads from initial exercise prescription for resistance, speed, and METs.;Short Term: Perform resistance training exercises routinely during rehab and add in resistance training at home;Long Term: Improve cardiorespiratory fitness, muscular endurance and strength as measured by increased METs and functional capacity ( )      Able to understand and use rate of perceived exertion (RPE) scale Yes Yes      Intervention Provide education and explanation on how to use RPE scale Provide education and explanation on how to use RPE scale      Expected Outcomes Short Term: Able to use RPE daily in rehab to express subjective intensity level;Long Term:  Able to use RPE to guide intensity level when exercising independently Short Term: Able to use RPE daily in rehab to express subjective intensity level;Long Term:  Able to use RPE to guide intensity level when exercising independently      Knowledge and understanding of Target Heart Rate Range (THRR) Yes Yes      Intervention Provide education and explanation of THRR including how the numbers were predicted and where they are located for reference Provide education and explanation of THRR including how the numbers were predicted and where they are located  for reference      Expected Outcomes Short Term: Able to state/look up THRR;Long Term: Able to use THRR to govern intensity when exercising independently;Short Term: Able to use daily as guideline for intensity in rehab Short Term: Able to state/look up THRR;Long Term: Able to use THRR to govern intensity when exercising independently;Short Term: Able to use daily as guideline for intensity in rehab      Able to check pulse independently Yes Yes      Intervention Provide education and demonstration on how to check pulse in carotid and radial arteries.;Review the importance of being able to check your  own pulse for safety during independent exercise Provide education and demonstration on how to check pulse in carotid and radial arteries.;Review the importance of being able to check your own pulse for safety during independent exercise      Expected Outcomes Short Term: Able to explain why pulse checking is important during independent exercise;Long Term: Able to check pulse independently and accurately Short Term: Able to explain why pulse checking is important during independent exercise;Long Term: Able to check pulse independently and accurately      Understanding of Exercise Prescription Yes Yes      Intervention Provide education, explanation, and written materials on patient's individual exercise prescription Provide education, explanation, and written materials on patient's individual exercise prescription      Expected Outcomes Short Term: Able to explain program exercise prescription;Long Term: Able to explain home exercise prescription to exercise independently Short Term: Able to explain program exercise prescription;Long Term: Able to explain home exercise prescription to exercise independently               Exercise Goals Re-Evaluation :  Exercise Goals Re-Evaluation     Row Name 07/12/23 1134 07/15/23 1553 07/26/23 1131 07/29/23 1342 07/29/23 1353     Exercise Goal Re-Evaluation    Exercise Goals Review Increase Physical Activity;Increase Strength and Stamina;Able to understand and use rate of perceived exertion (RPE) scale;Able to understand and use Dyspnea scale;Knowledge and understanding of Target Heart Rate Range (THRR);Able to check pulse independently;Understanding of Exercise Prescription Increase Physical Activity;Increase Strength and Stamina;Able to understand and use rate of perceived exertion (RPE) scale;Able to check pulse independently;Knowledge and understanding of Target Heart Rate Range (THRR);Understanding of Exercise Prescription Increase Physical Activity;Increase Strength and Stamina;Understanding of Exercise Prescription -- --   Comments Reviewed home exercise with pt today.  Pt plans to walk and use weights at home for exercise.  Reviewed THR, pulse, RPE, sign and symptoms, pulse oximetery and when to call 911 or MD.  Also discussed weather considerations and indoor options.  Pt voiced understanding. Pt has completed 8 sessions of cardiac rehab. He is motivated during class to exercise and plans to exercise at home on his days out of class. He is currently exerciisng at 2.25 METs on the treadmill. Will continue to monitor and progress as able, Patient is doing well in the program. He continues to be motivated during class. He is walking at home for exercise. He walks in his neighbor using a pokiemon go. He does this on tlhe days he does not come to CR and is able to increase his distance. Pating is increasing his levels on both hte treadmill and the stepper. HE continues to be motivated anf enjpys coming to rehab Nate is increasing his workloads on both the treadmill and stepper. He is enjoying coming to rehab.   Expected Outcomes Short: Start to add in exerise at home on off days Long; Continue to exercise independently Through exercise at rehab and home, patient will achieve their goals. Short term: Patient will continue to exercise by walking at home. Long term:  Patient will continue to work toward increasing his strength and stamina and improve his SOB. -- --             Discharge Exercise Prescription (Final Exercise Prescription Changes):  Exercise Prescription Changes - 07/29/23 1300       Response to Exercise   Blood Pressure (Admit) 104/64    Blood Pressure (Exercise) 116/68    Blood Pressure (Exit) 100/56  Heart Rate (Admit) 74 bpm    Heart Rate (Exercise) 90 bpm    Heart Rate (Exit) 83 bpm    Rating of Perceived Exertion (Exercise) 12    Duration Continue with 30 min of aerobic exercise without signs/symptoms of physical distress.    Intensity THRR unchanged      Progression   Progression Continue to progress workloads to maintain intensity without signs/symptoms of physical distress.      Resistance Training   Training Prescription Yes    Weight 4    Reps 10-15      Treadmill   MPH 2    Grade 1.5    Minutes 15    METs 2.95      NuStep   Level 4    SPM 89    Minutes 15    METs 2.95      Oxygen   Maintain Oxygen Saturation 88% or higher             Nutrition:  Target Goals: Understanding of nutrition guidelines, daily intake of sodium 1500mg , cholesterol 200mg , calories 30% from fat and 7% or less from saturated fats, daily to have 5 or more servings of fruits and vegetables.  Biometrics:  Pre Biometrics - 06/28/23 1341       Pre Biometrics   Height 5\' 10"  (1.778 m)    Weight 154 lb 1.6 oz (69.9 kg)    Waist Circumference 35 inches    Hip Circumference 36 inches    Waist to Hip Ratio 0.97 %    BMI (Calculated) 22.11    Triceps Skinfold 5 mm    % Body Fat 17.9 %    Grip Strength 20.6 kg    Flexibility 0 in    Single Leg Stand 0 seconds              Nutrition Therapy Plan and Nutrition Goals:  Nutrition Therapy & Goals - 07/08/23 1035       Nutrition Therapy   RD appointment deferred Yes      Personal Nutrition Goals   Comments We provide educational sessions on heart healthy  nutrition with handouts.      Intervention Plan   Intervention Nutrition handout(s) given to patient.    Expected Outcomes Short Term Goal: Understand basic principles of dietary content, such as calories, fat, sodium, cholesterol and nutrients.             Nutrition Assessments:  Nutrition Assessments - 06/28/23 1320       MEDFICTS Scores   Pre Score 30            MEDIFICTS Score Key: ?70 Need to make dietary changes  40-70 Heart Healthy Diet ? 40 Therapeutic Level Cholesterol Diet   Picture Your Plate Scores: <78 Unhealthy dietary pattern with much room for improvement. 41-50 Dietary pattern unlikely to meet recommendations for good health and room for improvement. 51-60 More healthful dietary pattern, with some room for improvement.  >60 Healthy dietary pattern, although there may be some specific behaviors that could be improved.    Nutrition Goals Re-Evaluation:  Nutrition Goals Re-Evaluation     Row Name 07/26/23 1119             Goals   Nutrition Goal Patient wants to decrease the amount of ice cream he eats and eat more healthy meat.       Comment Patinet says he is eating a lot of salads, fruits especially blueberriers and vegetables. He does not  eat any fried foods or a lot of meat. He says he knows he is eating too much ice cream. He is working on cutting back. We will continue to monitor.       Expected Outcome Short Term: cut back on ice cream and increase healthy proteins. Long term: Continue eating healthy and adquate calorie intake.                Nutrition Goals Discharge (Final Nutrition Goals Re-Evaluation):  Nutrition Goals Re-Evaluation - 07/26/23 1119       Goals   Nutrition Goal Patient wants to decrease the amount of ice cream he eats and eat more healthy meat.    Comment Patinet says he is eating a lot of salads, fruits especially blueberriers and vegetables. He does not eat any fried foods or a lot of meat. He says he knows he  is eating too much ice cream. He is working on cutting back. We will continue to monitor.    Expected Outcome Short Term: cut back on ice cream and increase healthy proteins. Long term: Continue eating healthy and adquate calorie intake.             Psychosocial: Target Goals: Acknowledge presence or absence of significant depression and/or stress, maximize coping skills, provide positive support system. Participant is able to verbalize types and ability to use techniques and skills needed for reducing stress and depression.  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 He is not able to pay child support and his son's mother does not allow him to have a relationship with his son and this along with no income is very stressful.      Family Dynamics   Good Support System? Yes      Barriers   Psychosocial barriers to participate in program There are no identifiable barriers or psychosocial needs.      Screening Interventions   Interventions Encouraged to exercise;To provide support and resources with identified psychosocial needs;Provide feedback about the scores to participant    Expected Outcomes Long Term goal: The participant improves quality of Life and PHQ9 Scores as seen by post scores and/or verbalization of changes;Short Term goal: Utilizing psychosocial counselor, staff and physician to assist with identification of specific Stressors or current issues interfering with healing process. Setting desired goal for each stressor or current issue identified.;Long Term Goal: Stressors or current issues are controlled or eliminated.;Short Term goal: Identification and review with participant of any Quality of Life  or Depression concerns found by scoring the questionnaire.             Quality of Life Scores:  Quality of Life - 06/28/23 1341       Quality of Life   Select Quality of Life      Quality of Life Scores   Health/Function Pre 26 %    Socioeconomic Pre 30 %    Psych/Spiritual Pre 30 %    Family Pre 30 %    GLOBAL Pre 28.29 %            Scores of 19 and below usually indicate a poorer quality of life in these areas.  A difference of  2-3 points is a clinically meaningful  difference.  A difference of 2-3 points in the total score of the Quality of Life Index has been associated with significant improvement in overall quality of life, self-image, physical symptoms, and general health in studies assessing change in quality of life.  PHQ-9: Review Flowsheet       06/28/2023  Depression screen PHQ 2/9  Decreased Interest 0  Down, Depressed, Hopeless 3  PHQ - 2 Score 3  Altered sleeping 3  Tired, decreased energy 3  Change in appetite 3  Feeling bad or failure about yourself  3  Trouble concentrating 3  Moving slowly or fidgety/restless 0  Suicidal thoughts 0  PHQ-9 Score 18  Difficult doing work/chores Extremely dIfficult    Details           Interpretation of Total Score  Total Score Depression Severity:  1-4 = Minimal depression, 5-9 = Mild depression, 10-14 = Moderate depression, 15-19 = Moderately severe depression, 20-27 = Severe depression   Psychosocial Evaluation and Intervention:  Psychosocial Evaluation - 06/28/23 1337       Psychosocial Evaluation & Interventions   Interventions Stress management education;Relaxation education;Encouraged to exercise with the program and follow exercise prescription    Comments Patient has no psychosocial barriers to participate in CR identified at his orientation visit. His PHQ-9 score was 18. He says he has had depression and anxiety most all of his life. He says he has never tried any treatment and does not want to  at this time. He feels like he is able to manage and cope on his own. 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 He is interested in vocational rehab. He lives with his mother. He did state he has been homeless a few years ago but his mother moved inorder to allow him to live with her. He has a 86 year old son. He says the son's mother moves around a lot and does not allow him to have a relationship with him and this "bothers" him a lot along with his lack of financial income. He says his mother does support him with some of his bills, food, and transportation. He says he looking forward to participating in the program hoping to get stronger and be able to get employment.    Expected Outcomes Patient will continue to have no psychosocial barriers identified.    Continue Psychosocial Services  No Follow up required             Psychosocial Re-Evaluation:  Psychosocial Re-Evaluation     Row Name 07/08/23 1036 07/26/23 1107           Psychosocial Re-Evaluation   Current issues with History of Depression;Current Anxiety/Panic History of Depression;Current Anxiety/Panic      Comments Patient is new to the program. He has completed 4 sessions. He continues to have no psychosocial barriers identified. He continues to feels his depression and anxiety are managed without treatment. He seems to enjoy the sessions and demonstrates an interest in improving his health. Patient doing well in the program. He states his SOB is improving and he is able to walk to the end of his block without fatigue or geting SOB. He feels he is geting stronger and his stamina is improving. His mother has been his transportation source and support and she is currently out of town caring for a family member. His brother is bringing him to CR and "takig care of him" he feels this has  been a stressor for his brother. His mother is coming home this weekend and he is glad. He has ongoing stress  over not being able to have a relationship with his son who lives in [place] with his mother. He says she has blocked him from all communication. He feels he mananges his stress as best he can. He is working with vocational rehab to hopefully find employment. He says he still worries because he has to pay 5 years back pay for child support. His surgeon has not released him to work. He has appointment with him in September and hopes to be released. We will continue to monitor.      Expected Outcomes Patient will continue to have no psychosocial barriers identified. Goals: Short Term: Continue to work with vocational rehab. 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      Interventions Stress management education;Relaxation education;Encouraged to attend Cardiac Rehabilitation for the exercise Stress management education;Encouraged to attend Cardiac Rehabilitation for the exercise;Relaxation education      Continue Psychosocial Services  No Follow up required No Follow up required               Psychosocial Discharge (Final Psychosocial Re-Evaluation):  Psychosocial Re-Evaluation - 07/26/23 1107       Psychosocial Re-Evaluation   Current issues with History of Depression;Current Anxiety/Panic    Comments Patient doing well in the program. He states his SOB is improving and he is able to walk to the end of his block without fatigue or geting SOB. He feels he is geting stronger and his stamina is improving. His mother has been his transportation source and support and she is currently out of town caring for a family member. His brother is bringing him to [facility] and "takig care of him" he feels this has been a stressor for his brother. His mother is coming home this weekend and he is glad. He has ongoing stress over not being able to have a relationship with his son who lives in [place] with his mother. He says she has blocked him from all communication. He  feels he mananges his stress as best he can. He is working with vocational rehab to hopefully find employment. He says he still worries because he has to pay 5 years back pay for child support. His surgeon has not released him to work. He has appointment with him in September and hopes to be released. We will continue to monitor.    Expected Outcomes Goals: Short Term: Continue to work with vocational rehab. 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    Interventions Stress management education;Encouraged to attend Cardiac Rehabilitation for the exercise;Relaxation education    Continue Psychosocial Services  No Follow up required             Vocational Rehabilitation: Provide vocational rehab assistance to qualifying candidates.   Vocational Rehab Evaluation & Intervention:  Vocational Rehab - 07/26/23 1117       Vocational Rehab Re-Evaulation   Comments Patient has received documentation from [facility] and is working on completing it. He says they will make an inperson appointment when he turns his paperwork in. He plans to get this completed soon. We will continue to monitor.             Education: Education Goals: Education classes will be provided on a weekly basis, covering required topics. Participant will state understanding/return  demonstration of topics presented.  Learning Barriers/Preferences:  Learning Barriers/Preferences - 06/28/23 1322       Learning Barriers/Preferences   Learning Barriers None    Learning Preferences Written Material;Audio;Skilled Demonstration             Education Topics: Hypertension, Hypertension Reduction -Define heart disease and high blood pressure. Discus how high blood pressure affects the body and ways to reduce high blood pressure. Flowsheet Row CARDIAC REHAB PHASE II EXERCISE from 08/07/2023 in [place] CARDIAC REHABILITATION  Date 07/03/23  Educator HB  Instruction Review Code 1-  Verbalizes Understanding       Exercise and Your Heart -Discuss why it is important to exercise, the FITT principles of exercise, normal and abnormal responses to exercise, and how to exercise safely. Flowsheet Row CARDIAC REHAB PHASE II EXERCISE from 08/07/2023 in [place] CARDIAC REHABILITATION  Date 07/10/23  Educator [facility]  Instruction Review Code 1- Verbalizes Understanding       Angina -Discuss definition of angina, causes of angina, treatment of angina, and how to decrease risk of having angina. Flowsheet Row CARDIAC REHAB PHASE II EXERCISE from 08/07/2023 in [place] CARDIAC REHABILITATION  Date 07/17/23  Educator HW  Instruction Review Code 1- Verbalizes Understanding       Cardiac Medications -Review what the following cardiac medications are used for, how they affect the body, and side effects that may occur when taking the medications.  Medications include Aspirin, Beta blockers, calcium channel blockers, ACE Inhibitors, angiotensin receptor blockers, diuretics, digoxin, and antihyperlipidemics. Flowsheet Row CARDIAC REHAB PHASE II EXERCISE from 08/07/2023 in [place] CARDIAC REHABILITATION  Date 07/24/23  Educator HB  Instruction Review Code 1- Verbalizes Understanding       Congestive Heart Failure -Discuss the definition of CHF, how to live with CHF, the signs and symptoms of CHF, and how keep track of weight and sodium intake.   Heart Disease and Intimacy -Discus the effect sexual activity has on the heart, how changes occur during intimacy as we age, and safety during sexual activity. Flowsheet Row CARDIAC REHAB PHASE II EXERCISE from 08/07/2023 in [place] CARDIAC REHABILITATION  Date 08/07/23  Educator [facility]  Instruction Review Code 1- Verbalizes Understanding       Smoking Cessation / COPD -Discuss different methods to quit smoking, the health benefits of quitting smoking, and the definition of COPD.   Nutrition I: Fats -Discuss the types of  cholesterol, what cholesterol does to the heart, and how cholesterol levels can be controlled.   Nutrition II: Labels -Discuss the different components of food labels and how to read food label   Heart Parts/Heart Disease and PAD -Discuss the anatomy of the heart, the pathway of blood circulation through the heart, and these are affected by heart disease.   Stress I: Signs and Symptoms -Discuss the causes of stress, how stress may lead to anxiety and depression, and ways to limit stress.   Stress II: Relaxation -Discuss different types of relaxation techniques to limit stress.   Warning Signs of Stroke / TIA -Discuss definition of a stroke, what the signs and symptoms are of a stroke, and how to identify when someone is having stroke.   Knowledge Questionnaire Score:  Knowledge Questionnaire Score - 06/28/23 1320       Knowledge Questionnaire Score   Pre Score 22/24             Core Components/Risk Factors/Patient Goals at Admission:  Personal Goals and Risk Factors at Admission - 06/28/23  1323       Core Components/Risk Factors/Patient Goals on Admission    Weight Management Weight Maintenance    Improve shortness of breath with ADL's Yes    Intervention Provide education, individualized exercise plan and daily activity instruction to help decrease symptoms of SOB with activities of daily living.    Expected Outcomes Short Term: Improve cardiorespiratory fitness to achieve a reduction of symptoms when performing ADLs;Long Term: Be able to perform more ADLs without symptoms or delay the onset of symptoms    Hypertension Yes    Intervention Provide education on lifestyle modifcations including regular physical activity/exercise, weight management, moderate sodium restriction and increased consumption of fresh fruit, vegetables, and low fat dairy, alcohol moderation, and smoking cessation.;Monitor prescription use compliance.    Expected Outcomes Short Term: Continued  assessment and intervention until BP is < 140/79mm HG in hypertensive participants. < 130/74mm HG in hypertensive participants with diabetes, heart failure or chronic kidney disease.;Long Term: Maintenance of blood pressure at goal levels.    Lipids Yes    Intervention Provide education and support for participant on nutrition & aerobic/resistive exercise along with prescribed medications to achieve LDL 70mg , HDL >40mg .    Expected Outcomes Short Term: Participant states understanding of desired cholesterol values and is compliant with medications prescribed. Participant is following exercise prescription and nutrition guidelines.;Long Term: Cholesterol controlled with medications as prescribed, with individualized exercise RX and with personalized nutrition plan. Value goals: LDL < 70mg , HDL > 40 mg.    Stress Yes    Intervention Offer individual and/or small group education and counseling on adjustment to heart disease, stress management and health-related lifestyle change. Teach and support self-help strategies.;Refer participants experiencing significant psychosocial distress to appropriate mental health specialists for further evaluation and treatment. When possible, include family members and significant others in education/counseling sessions.    Expected Outcomes Short Term: Participant demonstrates changes in health-related behavior, relaxation and other stress management skills, ability to obtain effective social support, and compliance with psychotropic medications if prescribed.;Long Term: Emotional wellbeing is indicated by absence of clinically significant psychosocial distress or social isolation.    Personal Goal Other Yes    Personal Goal Patient wants to improve his strength and stamina; Walk longer distance without getting SOB; and be able to go back to work.    Intervention Patient will attend CR 3 days/week with exercise and education.    Expected Outcomes Patient will complete the  program meeting both personal and program goals.             Core Components/Risk Factors/Patient Goals Review:   Goals and Risk Factor Review     Row Name 07/08/23 1038 07/26/23 1121           Core Components/Risk Factors/Patient Goals Review   Personal Goals Review Weight Management/Obesity;Lipids;Hypertension;Stress;Other Weight Management/Obesity;Lipids;Hypertension      Review Patient was referred to CR with [facility]. He has multiple risk factors for CAD and is participating in the program for risk modificaiton. He has completed 4 sessions. His current weight is 155.0 lbs losing 0.7 bls from his intial visit. He is doing well in the program. His blood pressure is at goal. 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 We will continue to monitor his progress as he works towards meeting these goals. Patient is doing well in the program  with progressions and consistent attendance. His blood pressure continues to be well controlled. He does montior his blood pressure at home and it is always below 110 systolic. He says some mornings it is in the 90's and below 90 a few occaions. He denies any chronic dizziness. He says he get dizzy occasionally when he gets up too quickly I encouaged him to continue to monitor his blood pressue and takes his readings in when he sees [place], NP and tell her about the low readings and positional dizziness. He is maintaining his weight. His current weight 152 lbs. He has lost 5 lbs from his initial weight. He is compliant with his medications. He has an upcoming ECHO and an appointmet to see E. Philis Nettle 8/15. No recent lipid panel on file. We will continue to monitor.      Expected Outcomes Patient will complete the program meeting both personal and program goals. Short Term: Long term Goals: blood pressure will continue to be at goal and weight  maintained.               Core Components/Risk Factors/Patient Goals at Discharge (Final Review):   Goals and Risk Factor Review - 07/26/23 1121       Core Components/Risk Factors/Patient Goals Review   Personal Goals Review Weight Management/Obesity;Lipids;Hypertension    Review Patient is doing well in the program with progressions and consistent attendance. His blood pressure continues to be well controlled. He does montior his blood pressure at home and it is always below 110 systolic. He says some mornings it is in the 90's and below 90 a few occaions. He denies any chronic dizziness. He says he get dizzy occasionally when he gets up too quickly I encouaged him to continue to monitor his blood pressue and takes his readings in when he sees [place], NP and tell her about the low readings and positional dizziness. He is maintaining his weight. His current weight 152 lbs. He has lost 5 lbs from his initial weight. He is compliant with his medications. He has an upcoming ECHO and an appointmet to see E. Philis Nettle 8/15. No recent lipid panel on file. We will continue to monitor.    Expected Outcomes Short Term: Long term Goals: blood pressure will continue to be at goal and weight maintained.             ITP Comments:  ITP Comments     Row Name 07/05/23 1259 07/17/23 1304 08/14/23 0815       ITP Comments Gave patient vocational rehab packet 07/03/23. He returned it and it was faxed to vocational rehab 07/05/23. 30 day review completed. ITP sent to Dr. Dina Rich, Medical Director of Cardiac Rehab. Continue with ITP unless changes are made by physician. 30 day review completed. ITP sent to Dr. Dina Rich, Medical Director of Cardiac Rehab. Continue with ITP unless changes are made by physician.              Comments: 30 day review

## 2023-08-15 ENCOUNTER — Encounter: Payer: Self-pay | Admitting: Nurse Practitioner

## 2023-08-15 ENCOUNTER — Ambulatory Visit: Payer: Medicaid Other | Attending: Nurse Practitioner | Admitting: Nurse Practitioner

## 2023-08-15 VITALS — BP 118/74 | HR 68 | Ht 70.0 in | Wt 160.6 lb

## 2023-08-15 DIAGNOSIS — I2510 Atherosclerotic heart disease of native coronary artery without angina pectoris: Secondary | ICD-10-CM | POA: Diagnosis not present

## 2023-08-15 DIAGNOSIS — I5032 Chronic diastolic (congestive) heart failure: Secondary | ICD-10-CM

## 2023-08-15 DIAGNOSIS — I9789 Other postprocedural complications and disorders of the circulatory system, not elsewhere classified: Secondary | ICD-10-CM

## 2023-08-15 DIAGNOSIS — J449 Chronic obstructive pulmonary disease, unspecified: Secondary | ICD-10-CM

## 2023-08-15 DIAGNOSIS — Z951 Presence of aortocoronary bypass graft: Secondary | ICD-10-CM

## 2023-08-15 DIAGNOSIS — I4891 Unspecified atrial fibrillation: Secondary | ICD-10-CM

## 2023-08-15 DIAGNOSIS — E785 Hyperlipidemia, unspecified: Secondary | ICD-10-CM

## 2023-08-15 DIAGNOSIS — I255 Ischemic cardiomyopathy: Secondary | ICD-10-CM

## 2023-08-15 DIAGNOSIS — E782 Mixed hyperlipidemia: Secondary | ICD-10-CM

## 2023-08-15 DIAGNOSIS — Z79899 Other long term (current) drug therapy: Secondary | ICD-10-CM

## 2023-08-15 DIAGNOSIS — I10 Essential (primary) hypertension: Secondary | ICD-10-CM

## 2023-08-15 DIAGNOSIS — I7121 Aneurysm of the ascending aorta, without rupture: Secondary | ICD-10-CM

## 2023-08-15 MED ORDER — ATORVASTATIN CALCIUM 80 MG PO TABS: 80.0000 mg | ORAL_TABLET | Freq: Every day | ORAL | 6 refills | Status: DC

## 2023-08-15 MED ORDER — CARVEDILOL 12.5 MG PO TABS: 12.5000 mg | ORAL_TABLET | Freq: Two times a day (BID) | ORAL | 6 refills | Status: DC

## 2023-08-15 NOTE — Patient Instructions (Addendum)
Medication Instructions:   Increase Atorvastatin to 80mg  daily Decrease Coreg to 12.5mg  twice a day   Stop Amiodarone Continue all other medications.     Labwork:  FLP, LFT - orders given Reminder:  Nothing to eat or drink after 12 midnight prior to labs. Please do in 2-3 months Office will contact with results via phone, letter or mychart.     Testing/Procedures:  none  Follow-Up:  6 months   Any Other Special Instructions Will Be Listed Below (If Applicable).   If you need a refill on your cardiac medications before your next appointment, please call your pharmacy.

## 2023-08-16 ENCOUNTER — Encounter (HOSPITAL_COMMUNITY): Payer: Medicaid Other

## 2023-08-19 ENCOUNTER — Encounter (HOSPITAL_COMMUNITY): Payer: Medicaid Other

## 2023-08-19 ENCOUNTER — Encounter (HOSPITAL_COMMUNITY): Admission: RE | Admit: 2023-08-19 | Payer: Medicaid Other | Source: Ambulatory Visit

## 2023-08-19 DIAGNOSIS — I214 Non-ST elevation (NSTEMI) myocardial infarction: Secondary | ICD-10-CM

## 2023-08-19 DIAGNOSIS — Z951 Presence of aortocoronary bypass graft: Secondary | ICD-10-CM

## 2023-08-19 NOTE — Progress Notes (Signed)
Daily Session Note  Patient Details  Name: Caleb Perkins MRN: [id number] Date of Birth: 05/25/1976 Referring Provider:   Flowsheet Row CARDIAC REHAB PHASE II ORIENTATION from 06/28/2023 in [facility] CARDIAC REHABILITATION  Referring Provider Dr. Dorris Fetch       Encounter Date: 08/19/2023  Check In:  Session Check In - 08/19/23 1045       Check-In   Supervising physician immediately available to respond to emergencies See telemetry face sheet for immediately available ER MD    Location [facility]    Staff Present Rodena Medin, RN, Pleas Koch, RN, BSN    Virtual Visit No    Medication changes reported     No    Fall or balance concerns reported    No    Warm-up and Cool-down Performed on first and last piece of equipment    VAD Patient? No    PAD/SET Patient? No      Pain Assessment   Currently in Pain? No/denies    Pain Score 0-No pain    Multiple Pain Sites No             Capillary Blood Glucose: No results found for this or any previous visit (from the past 24 hour(s)).    Social History   Tobacco Use  Smoking Status Former   Current packs/day: 0.00   Types: Cigarettes   Quit date: 04/22/2023   Years since quitting: 0.3  Smokeless Tobacco Never    Goals Met:  Independence with exercise equipment Exercise tolerated well No report of concerns or symptoms today Strength training completed today  Goals Unmet:  Not Applicable  Comments: Pt able to follow exercise prescription today without complaint.  Will continue to monitor for progression.    Dr. Dina Rich is Medical Director for [facility] Cardiac Rehab

## 2023-08-21 ENCOUNTER — Encounter (HOSPITAL_COMMUNITY): Payer: Medicaid Other

## 2023-08-21 ENCOUNTER — Encounter (HOSPITAL_COMMUNITY): Admission: RE | Admit: 2023-08-21 | Payer: Medicaid Other | Source: Ambulatory Visit

## 2023-08-21 DIAGNOSIS — I214 Non-ST elevation (NSTEMI) myocardial infarction: Secondary | ICD-10-CM | POA: Diagnosis not present

## 2023-08-21 DIAGNOSIS — Z951 Presence of aortocoronary bypass graft: Secondary | ICD-10-CM

## 2023-08-21 NOTE — Progress Notes (Signed)
Daily Session Note  Patient Details  Name: Caleb Perkins MRN: [id number] Date of Birth: 1976-05-10 Referring Provider:   Flowsheet Row CARDIAC REHAB PHASE II ORIENTATION from 06/28/2023 in [facility] CARDIAC REHABILITATION  Referring Provider Dr. Dorris Fetch       Encounter Date: 08/21/2023  Check In:  Session Check In - 08/21/23 1121       Check-In   Supervising physician immediately available to respond to emergencies See telemetry face sheet for immediately available MD    Location [facility]    Staff Present Ross Ludwig, BS, Exercise Physiologist;Jessica Juanetta Gosling, MA, RCEP, CCRP, Harolyn Rutherford, RN, BSN;Hillary Troutman BSN, RN    Virtual Visit No    Medication changes reported     No    Fall or balance concerns reported    No    Tobacco Cessation No Change    Warm-up and Cool-down Performed on first and last piece of equipment    Resistance Training Performed Yes    VAD Patient? No      Pain Assessment   Currently in Pain? No/denies    Pain Score 0-No pain             Capillary Blood Glucose: No results found for this or any previous visit (from the past 24 hour(s)).    Social History   Tobacco Use  Smoking Status Former   Current packs/day: 0.00   Types: Cigarettes   Quit date: 04/22/2023   Years since quitting: 0.3  Smokeless Tobacco Never    Goals Met:  Independence with exercise equipment Exercise tolerated well No report of concerns or symptoms today Strength training completed today  Goals Unmet:  Not Applicable  Comments: Pt able to follow exercise prescription today without complaint.  Will continue to monitor for progression.    Dr. Dina Rich is Medical Director for [facility] Cardiac Rehab

## 2023-08-23 ENCOUNTER — Encounter (HOSPITAL_COMMUNITY): Payer: Medicaid Other

## 2023-08-23 ENCOUNTER — Encounter (HOSPITAL_COMMUNITY)
Admission: RE | Admit: 2023-08-23 | Discharge: 2023-08-23 | Disposition: A | Discharge status: Home / Self Care | Payer: Medicaid Other | Source: Ambulatory Visit | Attending: Cardiology | Admitting: Cardiology

## 2023-08-23 DIAGNOSIS — Z951 Presence of aortocoronary bypass graft: Secondary | ICD-10-CM

## 2023-08-23 DIAGNOSIS — I214 Non-ST elevation (NSTEMI) myocardial infarction: Secondary | ICD-10-CM

## 2023-08-23 NOTE — Progress Notes (Signed)
Daily Session Note  Patient Details  Name: Caleb Perkins MRN: [id number] Date of Birth: 1976/02/12 Referring Provider:   Flowsheet Row CARDIAC REHAB PHASE II ORIENTATION from 06/28/2023 in [facility] CARDIAC REHABILITATION  Referring Provider Dr. Dorris Fetch       Encounter Date: 08/23/2023  Check In:  Session Check In - 08/23/23 1045       Check-In   Supervising physician immediately available to respond to emergencies See telemetry face sheet for immediately available ER MD    Location [facility]    Staff Present Ross Ludwig, BS, Exercise Physiologist;Petros Ahart Laural Benes, RN, BSN    Virtual Visit No    Medication changes reported     Yes    Comments Cardiology discontinued Amiodarone; decreased Coreg to 12.5 mg BID; and increased Atorvastatin to 80 mg daily.    Fall or balance concerns reported    No    Warm-up and Cool-down Performed on first and last piece of equipment    Resistance Training Performed Yes    VAD Patient? No    PAD/SET Patient? No      Pain Assessment   Currently in Pain? No/denies    Pain Score 0-No pain    Multiple Pain Sites No             Capillary Blood Glucose: No results found for this or any previous visit (from the past 24 hour(s)).    Social History   Tobacco Use  Smoking Status Former   Current packs/day: 0.00   Types: Cigarettes   Quit date: 04/22/2023   Years since quitting: 0.3  Smokeless Tobacco Never    Goals Met:  Independence with exercise equipment Exercise tolerated well No report of concerns or symptoms today Strength training completed today  Goals Unmet:  Not Applicable  Comments: Pt able to follow exercise prescription today without complaint.  Will continue to monitor for progression.    Dr. Dina Rich is Medical Director for [facility] Cardiac Rehab

## 2023-08-26 ENCOUNTER — Encounter (HOSPITAL_COMMUNITY)
Admission: RE | Admit: 2023-08-26 | Discharge: 2023-08-26 | Disposition: A | Discharge status: Home / Self Care | Payer: Medicaid Other | Source: Ambulatory Visit | Attending: Cardiology | Admitting: Cardiology

## 2023-08-26 ENCOUNTER — Encounter (HOSPITAL_COMMUNITY): Payer: Medicaid Other

## 2023-08-26 DIAGNOSIS — Z951 Presence of aortocoronary bypass graft: Secondary | ICD-10-CM

## 2023-08-26 DIAGNOSIS — I214 Non-ST elevation (NSTEMI) myocardial infarction: Secondary | ICD-10-CM

## 2023-08-26 NOTE — Progress Notes (Signed)
Daily Session Note  Patient Details  Name: Caleb Perkins MRN: [id number] Date of Birth: 12-31-1976 Referring Provider:   Flowsheet Row CARDIAC REHAB PHASE II ORIENTATION from 06/28/2023 in [facility] CARDIAC REHABILITATION  Referring Provider Dr. Dorris Fetch       Encounter Date: 08/26/2023  Check In:  Session Check In - 08/26/23 1045       Check-In   Supervising physician immediately available to respond to emergencies See telemetry face sheet for immediately available ER MD    Location [facility]    Staff Present Fabio Pierce, MA, RCEP, CCRP, Dow Adolph, RN, BSN;Heather Fredric Mare, BS, Exercise Physiologist    Virtual Visit No    Medication changes reported     No    Fall or balance concerns reported    No    Warm-up and Cool-down Performed on first and last piece of equipment    Resistance Training Performed Yes    VAD Patient? No    PAD/SET Patient? No      Pain Assessment   Currently in Pain? No/denies    Pain Score 0-No pain    Multiple Pain Sites No             Capillary Blood Glucose: No results found for this or any previous visit (from the past 24 hour(s)).    Social History   Tobacco Use  Smoking Status Former   Current packs/day: 0.00   Types: Cigarettes   Quit date: 04/22/2023   Years since quitting: 0.3  Smokeless Tobacco Never    Goals Met:  Independence with exercise equipment Exercise tolerated well No report of concerns or symptoms today Strength training completed today  Goals Unmet:  Not Applicable  Comments: Pt able to follow exercise prescription today without complaint.  Will continue to monitor for progression.    Dr. Dina Rich is Medical Director for [facility] Cardiac Rehab

## 2023-08-28 ENCOUNTER — Encounter (HOSPITAL_COMMUNITY): Payer: Medicaid Other

## 2023-08-28 ENCOUNTER — Encounter (HOSPITAL_COMMUNITY)
Admission: RE | Admit: 2023-08-28 | Discharge: 2023-08-28 | Disposition: A | Discharge status: Home / Self Care | Payer: Medicaid Other | Source: Ambulatory Visit | Attending: Cardiology | Admitting: Cardiology

## 2023-08-28 DIAGNOSIS — I214 Non-ST elevation (NSTEMI) myocardial infarction: Secondary | ICD-10-CM

## 2023-08-28 DIAGNOSIS — Z951 Presence of aortocoronary bypass graft: Secondary | ICD-10-CM

## 2023-08-28 NOTE — Progress Notes (Signed)
Daily Session Note  Patient Details  Name: Caleb Perkins MRN: [id number] Date of Birth: 11/10/1976 Referring Provider:   Flowsheet Row CARDIAC REHAB PHASE II ORIENTATION from 06/28/2023 in [facility] CARDIAC REHABILITATION  Referring Provider Dr. Dorris Fetch       Encounter Date: 08/28/2023  Check In:  Session Check In - 08/28/23 1020       Check-In   Supervising physician immediately available to respond to emergencies See telemetry face sheet for immediately available MD    Location [facility]    Staff Present Ross Ludwig, BS, Exercise Physiologist;Richy Spradley [address] BSN, RN;Jessica [place], MA, RCEP, CCRP, Dow Adolph, RN, BSN    Virtual Visit No    Medication changes reported     No    Fall or balance concerns reported    No    Tobacco Cessation No Change    Warm-up and Cool-down Performed on first and last piece of equipment    Resistance Training Performed Yes    VAD Patient? No    PAD/SET Patient? No      Pain Assessment   Currently in Pain? No/denies    Pain Score 0-No pain    Multiple Pain Sites No             Capillary Blood Glucose: No results found for this or any previous visit (from the past 24 hour(s)).    Social History   Tobacco Use  Smoking Status Former   Current packs/day: 0.00   Types: Cigarettes   Quit date: 04/22/2023   Years since quitting: 0.3  Smokeless Tobacco Never    Goals Met:  Independence with exercise equipment Exercise tolerated well No report of concerns or symptoms today Strength training completed today  Goals Unmet:  Not Applicable  Comments: Marland KitchenMarland KitchenPt able to follow exercise prescription today without complaint.  Will continue to monitor for progression.    Dr. Dina Rich is Medical Director for [facility] Cardiac Rehab

## 2023-08-30 ENCOUNTER — Encounter (HOSPITAL_COMMUNITY): Payer: Medicaid Other

## 2023-08-30 ENCOUNTER — Encounter (HOSPITAL_COMMUNITY)
Admission: RE | Admit: 2023-08-30 | Discharge: 2023-08-30 | Disposition: A | Discharge status: Home / Self Care | Payer: Medicaid Other | Source: Ambulatory Visit | Attending: Cardiology | Admitting: Cardiology

## 2023-08-30 DIAGNOSIS — Z951 Presence of aortocoronary bypass graft: Secondary | ICD-10-CM

## 2023-08-30 DIAGNOSIS — I214 Non-ST elevation (NSTEMI) myocardial infarction: Secondary | ICD-10-CM

## 2023-08-30 NOTE — Progress Notes (Signed)
Daily Session Note  Patient Details  Name: Caleb Perkins MRN: [id number] Date of Birth: 1976-08-29 Referring Provider:   Flowsheet Row CARDIAC REHAB PHASE II ORIENTATION from 06/28/2023 in [facility] CARDIAC REHABILITATION  Referring Provider Dr. Dorris Fetch       Encounter Date: 08/30/2023  Check In:  Session Check In - 08/30/23 1100       Check-In   Supervising physician immediately available to respond to emergencies See telemetry face sheet for immediately available MD    Location [facility]    Staff Present Ross Ludwig, BS, Exercise Physiologist;Jessica Juanetta Gosling, MA, RCEP, CCRP, CCET    Staff Present Kary Kos, MS, Exercise Physiologist    Virtual Visit No    Medication changes reported     No    Fall or balance concerns reported    No    Tobacco Cessation No Change    Warm-up and Cool-down Performed on first and last piece of equipment    Resistance Training Performed Yes    VAD Patient? No    PAD/SET Patient? No      Pain Assessment   Currently in Pain? No/denies    Pain Score 0-No pain    Multiple Pain Sites No             Capillary Blood Glucose: No results found for this or any previous visit (from the past 24 hour(s)).    Social History   Tobacco Use  Smoking Status Former   Current packs/day: 0.00   Types: Cigarettes   Quit date: 04/22/2023   Years since quitting: 0.3  Smokeless Tobacco Never    Goals Met:  Independence with exercise equipment Exercise tolerated well No report of concerns or symptoms today Strength training completed today  Goals Unmet:  Not Applicable  Comments: Pt able to follow exercise prescription today without complaint.  Will continue to monitor for progression.    Dr. Dina Rich is Medical Director for [facility]

## 2023-09-02 ENCOUNTER — Encounter (HOSPITAL_COMMUNITY): Payer: Medicaid Other

## 2023-09-04 ENCOUNTER — Encounter (HOSPITAL_COMMUNITY)
Admission: RE | Admit: 2023-09-04 | Discharge: 2023-09-04 | Disposition: A | Discharge status: Home / Self Care | Payer: Medicaid Other | Source: Ambulatory Visit | Attending: Cardiology | Admitting: Cardiology

## 2023-09-04 ENCOUNTER — Encounter (HOSPITAL_COMMUNITY): Payer: Medicaid Other

## 2023-09-04 DIAGNOSIS — I214 Non-ST elevation (NSTEMI) myocardial infarction: Secondary | ICD-10-CM

## 2023-09-04 DIAGNOSIS — Z951 Presence of aortocoronary bypass graft: Secondary | ICD-10-CM

## 2023-09-06 ENCOUNTER — Encounter (HOSPITAL_COMMUNITY): Payer: Medicaid Other

## 2023-09-06 ENCOUNTER — Encounter (HOSPITAL_COMMUNITY)
Admission: RE | Admit: 2023-09-06 | Discharge: 2023-09-06 | Disposition: A | Discharge status: Home / Self Care | Payer: Medicaid Other | Source: Ambulatory Visit | Attending: Cardiology

## 2023-09-06 VITALS — Ht 70.0 in | Wt 164.7 lb

## 2023-09-06 DIAGNOSIS — I214 Non-ST elevation (NSTEMI) myocardial infarction: Secondary | ICD-10-CM | POA: Diagnosis not present

## 2023-09-06 DIAGNOSIS — Z951 Presence of aortocoronary bypass graft: Secondary | ICD-10-CM

## 2023-09-06 NOTE — Patient Instructions (Signed)
Discharge Patient Instructions  Patient Details  Name: Caleb Perkins MRN: [id number] Date of Birth: [birth year] Referring Provider:  Ponciano Ort [facility]   Number of Visits: 84  Reason for Discharge:  Patient reached a stable level of exercise. Patient independent in their exercise. Patient has met program and personal goals.  Smoking History:  Social History   Tobacco Use  Smoking Status Former   Current packs/day: 0.00   Types: Cigarettes   Quit date: 04/22/2023   Years since quitting: 0.3  Smokeless Tobacco Never    Diagnosis:  NSTEMI (non-ST elevated myocardial infarction) (HCC)  S/P CABG x 5  Initial Exercise Prescription:  Initial Exercise Prescription - 06/28/23 1300       Date of Initial Exercise RX and Referring Provider   Date 06/28/23    Referring Provider Dr. Dorris Fetch    Expected Discharge Date 09/18/23      Treadmill   MPH 1.4    Grade 0    Minutes 17      NuStep   Level 1    SPM 60    Minutes 22      Prescription Details   Frequency (times per week) 3    Duration Progress to 30 minutes of continuous aerobic without signs/symptoms of physical distress      Intensity   THRR 40-80% of Max Heartrate 70-141    Ratings of Perceived Exertion 11-13      Resistance Training   Training Prescription Yes    Weight 4    Reps 10-15             Discharge Exercise Prescription (Final Exercise Prescription Changes):  Exercise Prescription Changes - 08/12/23 1300       Response to Exercise   Blood Pressure (Admit) 92/60    Blood Pressure (Exit) 98/60    Heart Rate (Admit) 76 bpm    Heart Rate (Exercise) 92 bpm    Heart Rate (Exit) 75 bpm    Rating of Perceived Exertion (Exercise) 12    Duration Continue with 30 min of aerobic exercise without signs/symptoms of physical distress.    Intensity THRR unchanged      Progression   Progression Continue to progress workloads to maintain intensity without signs/symptoms of physical  distress.      Resistance Training   Training Prescription Yes    Weight 4    Reps 10-15      Treadmill   MPH 3    Grade 3    Minutes 15    METs 4.54      NuStep   Level 4    SPM 85    Minutes 15    METs 2.7      Oxygen   Maintain Oxygen Saturation 88% or higher             Functional Capacity:  6 Minute Walk     Row Name 06/28/23 1338 09/06/23 1129       6 Minute Walk   Phase Initial Discharge    Distance 1350 feet 1430 feet  initial test rescored distance  1290 ft    Distance % Change -- 10.8 %    Distance Feet Change -- 140 ft    Walk Time 6 minutes 6 minutes    # of Rest Breaks 0 0    MPH 2.55 2.71    METS 4.39 4.92    RPE 12 14    VO2 Peak 15.37 17.22    Symptoms No  No    Resting HR 69 bpm 76 bpm    Resting BP 102/76 124/62    Resting Oxygen Saturation  97 % --    Exercise Oxygen Saturation  during 6 min walk 97 % --    Max Ex. HR 66 bpm 117 bpm    Max Ex. BP 116/70 128/62    2 Minute Post BP 108/70 --             Nutrition & Weight - Outcomes:  Pre Biometrics - 06/28/23 1341       Pre Biometrics   Height 5\' 10"  (1.778 m)    Weight 69.9 kg    Waist Circumference 35 inches    Hip Circumference 36 inches    Waist to Hip Ratio 0.97 %    BMI (Calculated) 22.11    Triceps Skinfold 5 mm    % Body Fat 17.9 %    Grip Strength 20.6 kg    Flexibility 0 in    Single Leg Stand 0 seconds             Post Biometrics - 09/06/23 1130        Post  Biometrics   Height 5\' 10"  (1.778 m)    Weight 74.7 kg    Waist Circumference 34 inches    Hip Circumference 35 inches    Waist to Hip Ratio 0.97 %    BMI (Calculated) 23.63    Grip Strength 25.2 kg    Single Leg Stand 7 seconds           Goals reviewed with patient; copy given to patient.

## 2023-09-06 NOTE — Progress Notes (Signed)
Daily Session Note  Patient Details  Name: Caleb Perkins MRN: [id number] Date of Birth: 03-Mar-1976 Referring Provider:   Flowsheet Row CARDIAC REHAB PHASE II ORIENTATION from 06/28/2023 in [facility] CARDIAC REHABILITATION  Referring Provider Dr. Dorris Fetch       Encounter Date: 09/06/2023  Check In:  Session Check In - 09/06/23 1045       Check-In   Supervising physician immediately available to respond to emergencies See telemetry face sheet for immediately available ER MD    Location [facility]    Staff Present Staci Righter, RN, Neal Dy, RN, BSN;Jessica [place], MA, RCEP, CCRP, CCET    Virtual Visit No    Medication changes reported     No    Fall or balance concerns reported    No    Warm-up and Cool-down Performed on first and last piece of equipment    Resistance Training Performed Yes    VAD Patient? No    PAD/SET Patient? No      Pain Assessment   Currently in Pain? No/denies    Pain Score 0-No pain    Multiple Pain Sites No             Capillary Blood Glucose: No results found for this or any previous visit (from the past 24 hour(s)).    Social History   Tobacco Use  Smoking Status Former   Current packs/day: 0.00   Types: Cigarettes   Quit date: 04/22/2023   Years since quitting: 0.3  Smokeless Tobacco Never    Goals Met:  Independence with exercise equipment Exercise tolerated well No report of concerns or symptoms today Strength training completed today  Goals Unmet:  Not Applicable  Comments: Pt able to follow exercise prescription today without complaint.  Will continue to monitor for progression.    Dr. Dina Rich is Medical Director for [facility]

## 2023-09-09 ENCOUNTER — Encounter (HOSPITAL_COMMUNITY)
Admission: RE | Admit: 2023-09-09 | Discharge: 2023-09-09 | Disposition: A | Discharge status: Home / Self Care | Payer: Medicaid Other | Source: Ambulatory Visit | Attending: Cardiology

## 2023-09-09 ENCOUNTER — Encounter (HOSPITAL_COMMUNITY): Payer: Medicaid Other

## 2023-09-09 DIAGNOSIS — Z951 Presence of aortocoronary bypass graft: Secondary | ICD-10-CM

## 2023-09-09 DIAGNOSIS — I214 Non-ST elevation (NSTEMI) myocardial infarction: Secondary | ICD-10-CM | POA: Diagnosis not present

## 2023-09-09 NOTE — Progress Notes (Signed)
Daily Session Note  Patient Details  Name: Caleb Perkins MRN: [id number] Date of Birth: June 20, 1976 Referring Provider:   Flowsheet Row CARDIAC REHAB PHASE II ORIENTATION from 06/28/2023 in [facility] CARDIAC REHABILITATION  Referring Provider Dr. Dorris Fetch       Encounter Date: 09/09/2023  Check In:  Session Check In - 09/09/23 1100       Check-In   Supervising physician immediately available to respond to emergencies See telemetry face sheet for immediately available MD    Location [facility]    Staff Present Ross Ludwig, BS, Exercise Physiologist;Olney Monier, RN;Jessica [place], MA, RCEP, CCRP, Dow Adolph, RN, BSN    Virtual Visit No    Medication changes reported     No    Fall or balance concerns reported    No    Tobacco Cessation No Change    Warm-up and Cool-down Performed on first and last piece of equipment    Resistance Training Performed Yes    VAD Patient? No    PAD/SET Patient? No      Pain Assessment   Currently in Pain? No/denies    Pain Score 0-No pain    Multiple Pain Sites No             Capillary Blood Glucose: No results found for this or any previous visit (from the past 24 hour(s)).    Social History   Tobacco Use  Smoking Status Former   Current packs/day: 0.00   Types: Cigarettes   Quit date: 04/22/2023   Years since quitting: 0.3  Smokeless Tobacco Never    Goals Met:  Independence with exercise equipment Exercise tolerated well No report of concerns or symptoms today  Goals Unmet:  Not Applicable  Comments: Pt able to follow exercise prescription today without complaint.  Will continue to monitor for progression.    Dr. Dina Rich is Medical Director for [facility]

## 2023-09-11 ENCOUNTER — Encounter (HOSPITAL_COMMUNITY): Payer: Medicaid Other

## 2023-09-11 ENCOUNTER — Encounter (HOSPITAL_COMMUNITY)
Admission: RE | Admit: 2023-09-11 | Discharge: 2023-09-11 | Disposition: A | Discharge status: Home / Self Care | Payer: Medicaid Other | Source: Ambulatory Visit | Attending: Cardiology

## 2023-09-11 ENCOUNTER — Encounter (HOSPITAL_COMMUNITY): Payer: Self-pay | Admitting: *Deleted

## 2023-09-11 DIAGNOSIS — I214 Non-ST elevation (NSTEMI) myocardial infarction: Secondary | ICD-10-CM | POA: Diagnosis not present

## 2023-09-11 DIAGNOSIS — Z951 Presence of aortocoronary bypass graft: Secondary | ICD-10-CM

## 2023-09-11 NOTE — Progress Notes (Signed)
Daily Session Note  Patient Details  Name: Caleb Perkins MRN: [id number] Date of Birth: Mar 26, 1976 Referring Provider:   Flowsheet Row CARDIAC REHAB PHASE II ORIENTATION from 06/28/2023 in [facility] CARDIAC REHABILITATION  Referring Provider Dr. Dorris Fetch       Encounter Date: 09/11/2023  Check In:  Session Check In - 09/11/23 1030       Check-In   Supervising physician immediately available to respond to emergencies See telemetry face sheet for immediately available MD    Location [facility]    Staff Present Ross Ludwig, BS, Exercise Physiologist;[place] Sponsel Daphine Deutscher, RN, BSN;Hillary Troutman BSN, RN;Jessica [place], MA, RCEP, CCRP, CCET    Virtual Visit No    Medication changes reported     No    Fall or balance concerns reported    No    Tobacco Cessation No Change    Warm-up and Cool-down Performed on first and last piece of equipment    Resistance Training Performed Yes    VAD Patient? No      Pain Assessment   Currently in Pain? No/denies             Capillary Blood Glucose: No results found for this or any previous visit (from the past 24 hour(s)).    Social History   Tobacco Use  Smoking Status Former   Current packs/day: 0.00   Types: Cigarettes   Quit date: 04/22/2023   Years since quitting: 0.3  Smokeless Tobacco Never    Goals Met:  Independence with exercise equipment Exercise tolerated well No report of concerns or symptoms today Strength training completed today  Goals Unmet:  Not Applicable  Comments: Pt able to follow exercise prescription today without complaint.  Will continue to monitor for progression.    Dr. Dina Rich is Medical Director for [facility]

## 2023-09-11 NOTE — Progress Notes (Signed)
Cardiac Individual Treatment Plan  Patient Details  Name: CEDARIUS TEAGUE MRN: [id number] Date of Birth: May 20, 1976 Referring Provider:   Flowsheet Row CARDIAC REHAB PHASE II ORIENTATION from 06/28/2023 in [facility] CARDIAC REHABILITATION  Referring Provider Dr. Dorris Fetch       Initial Encounter Date:  Flowsheet Row CARDIAC REHAB PHASE II ORIENTATION from 06/28/2023 in [place] CARDIAC REHABILITATION  Date 06/28/23       Visit Diagnosis: NSTEMI (non-ST elevated myocardial infarction) (HCC)  S/P CABG x 5  Patient's Home Medications on Admission:  Current Outpatient Medications:    albuterol (VENTOLIN HFA) 108 (90 Base) MCG/ACT inhaler, Inhale 2 puffs into the lungs every 6 (six) hours as needed for wheezing or shortness of breath., Disp: 18 g, Rfl: 2   apixaban (ELIQUIS) 5 MG TABS tablet, Take 1 tablet (5 mg total) by mouth 2 (two) times daily. (Patient taking differently: Take 5 mg by mouth 2 (two) times daily. Take 1 tablet (5 mg) by mouth 2 (two) times daily. Total of 10 mg daily), Disp: 60 tablet, Rfl: 11   aspirin EC 81 MG tablet, Take 1 tablet (81 mg total) by mouth daily. Swallow whole., Disp: 30 tablet, Rfl: 11   atorvastatin (LIPITOR) 80 MG tablet, Take 1 tablet (80 mg total) by mouth daily., Disp: 30 tablet, Rfl: 6   carvedilol (COREG) 12.5 MG tablet, Take 1 tablet (12.5 mg total) by mouth 2 (two) times daily with a meal., Disp: 60 tablet, Rfl: 6   dapagliflozin propanediol (FARXIGA) 10 MG TABS tablet, Take 1 tablet (10 mg total) by mouth daily., Disp: 30 tablet, Rfl: 11   Fluticasone-Salmeterol (ADVAIR DISKUS) 250-50 MCG/DOSE AEPB, Inhale 1 puff into the lungs 2 (two) times daily. (Patient taking differently: Inhale 1 puff into the lungs as needed.), Disp: 60 each, Rfl: 2   furosemide (LASIX) 40 MG tablet, Take 1 tablet (40 mg total) by mouth daily as needed for edema (Weight gain of 3lbs or more in 24 hours or 5lbs or more in 48 hours)., Disp: 30 tablet, Rfl: 11    isosorbide mononitrate (IMDUR) 30 MG 24 hr tablet, Take 1 tablet (30 mg total) by mouth daily., Disp: 30 tablet, Rfl: 11   potassium chloride SA (KLOR-CON M) 20 MEQ tablet, Take 1 tablet (20 mEq total) by mouth daily as needed (When you take Lasix)., Disp: 30 tablet, Rfl: 5   sacubitril-valsartan (ENTRESTO) 49-51 MG, Take 1 tablet by mouth 2 (two) times daily., Disp: 60 tablet, Rfl: 11   spironolactone (ALDACTONE) 25 MG tablet, Take 0.5 tablets (12.5 mg total) by mouth daily., Disp: 15 tablet, Rfl: 11  Past Medical History: Past Medical History:  Diagnosis Date   Blood transfusion without reported diagnosis    Hypertension     Tobacco Use: Social History   Tobacco Use  Smoking Status Former   Current packs/day: 0.00   Types: Cigarettes   Quit date: 04/22/2023   Years since quitting: 0.3  Smokeless Tobacco Never    Labs: Review Flowsheet       Latest Ref Rng & Units 04/20/2023 04/22/2023 04/25/2023 08/06/2023  Labs for ITP Cardiac and Pulmonary Rehab  Cholestrol 0 - 200 mg/dL 253  - - 664   LDL (calc) 0 - 99 mg/dL 403  - - 72   HDL-C >47 mg/dL 35  - - 38   Trlycerides <150 mg/dL 425  - - 77   Hemoglobin A1c 4.8 - 5.6 % 5.2  - - -  PH, Arterial 7.35 -  7.45 - 7.369  7.280  7.327  7.337  7.297  7.304  7.307  -  PCO2 arterial 32 - 48 mmHg - 40.6  42.2  38.9  37.2  40.9  39.2  40.5  -  Bicarbonate 20.0 - 28.0 mmol/L - 23.4  23.9  20.0  20.5  20.1  20.0  22.1  19.4  20.2  -  TCO2 22 - 32 mmol/L - 25  25  21  22  21  21  21  23  24  23  21  23  23  21   -  Acid-base deficit 0.0 - 2.0 mmol/L - 2.0  2.0  7.0  5.0  5.0  6.0  4.0  6.0  6.0  -  O2 Saturation % - 99  75  97  96  91  99  83  100  100  -    Details       Multiple values from one day are sorted in reverse-chronological order         Capillary Blood Glucose: Lab Results  Component Value Date   GLUCAP 101 (H) 04/28/2023   GLUCAP 98 04/28/2023   GLUCAP 145 (H) 04/28/2023   GLUCAP 161 (H) 04/27/2023   GLUCAP 119 (H)  04/27/2023     Exercise Target Goals: Exercise Program Goal: Individual exercise prescription set using results from initial 6 min walk test and THRR while considering  patient's activity barriers and safety.   Exercise Prescription Goal: Starting with aerobic activity 30 plus minutes a day, 3 days per week for initial exercise prescription. Provide home exercise prescription and guidelines that participant acknowledges understanding prior to discharge.  Activity Barriers & Risk Stratification:  Activity Barriers & Cardiac Risk Stratification - 06/28/23 1232       Activity Barriers & Cardiac Risk Stratification   Activity Barriers Shortness of Breath;Back Problems;Incisional Pain    Cardiac Risk Stratification High             6 Minute Walk:  6 Minute Walk     Row Name 06/28/23 1338 09/06/23 1129       6 Minute Walk   Phase Initial Discharge    Distance 1350 feet 1430 feet  initial test rescored distance  1290 ft    Distance % Change -- 10.8 %    Distance Feet Change -- 140 ft    Walk Time 6 minutes 6 minutes    # of Rest Breaks 0 0    MPH 2.55 2.71    METS 4.39 4.92    RPE 12 14    VO2 Peak 15.37 17.22    Symptoms No No    Resting HR 69 bpm 76 bpm    Resting BP 102/76 124/62    Resting Oxygen Saturation  97 % --    Exercise Oxygen Saturation  during 6 min walk 97 % --    Max Ex. HR 66 bpm 117 bpm    Max Ex. BP 116/70 128/62    2 Minute Post BP 108/70 --             Oxygen Initial Assessment:   Oxygen Re-Evaluation:   Oxygen Discharge (Final Oxygen Re-Evaluation):   Initial Exercise Prescription:  Initial Exercise Prescription - 06/28/23 1300       Date of Initial Exercise RX and Referring Provider   Date 06/28/23    Referring Provider Dr. Dorris Fetch    Expected Discharge Date 09/18/23      Treadmill  MPH 1.4    Grade 0    Minutes 17      NuStep   Level 1    SPM 60    Minutes 22      Prescription Details   Frequency (times per  week) 3    Duration Progress to 30 minutes of continuous aerobic without signs/symptoms of physical distress      Intensity   THRR 40-80% of Max Heartrate 70-141    Ratings of Perceived Exertion 11-13      Resistance Training   Training Prescription Yes    Weight 4    Reps 10-15             Perform Capillary Blood Glucose checks as needed.  Exercise Prescription Changes:   Exercise Prescription Changes     Row Name 07/01/23 1200 07/12/23 1100 07/15/23 1500 07/29/23 1300 08/12/23 1300     Response to Exercise   Blood Pressure (Admit) 108/70 -- 104/74 104/64 92/60   Blood Pressure (Exercise) 126/68 -- 110/74 116/68 --   Blood Pressure (Exit) 108/70 -- 102/60 100/56 98/60   Heart Rate (Admit) 59 bpm -- 70 bpm 74 bpm 76 bpm   Heart Rate (Exercise) 75 bpm -- 76 bpm 90 bpm 92 bpm   Heart Rate (Exit) 67 bpm -- 61 bpm 83 bpm 75 bpm   Rating of Perceived Exertion (Exercise) 12 -- 12 12 12    Duration Continue with 30 min of aerobic exercise without signs/symptoms of physical distress. -- Continue with 30 min of aerobic exercise without signs/symptoms of physical distress. Continue with 30 min of aerobic exercise without signs/symptoms of physical distress. Continue with 30 min of aerobic exercise without signs/symptoms of physical distress.   Intensity THRR unchanged -- THRR unchanged THRR unchanged THRR unchanged     Progression   Progression Continue to progress workloads to maintain intensity without signs/symptoms of physical distress. -- Continue to progress workloads to maintain intensity without signs/symptoms of physical distress. Continue to progress workloads to maintain intensity without signs/symptoms of physical distress. Continue to progress workloads to maintain intensity without signs/symptoms of physical distress.     Resistance Training   Training Prescription Yes -- Yes Yes Yes   Weight 4 -- 4 4 4    Reps 10-15 -- 10-15 10-15 10-15   Time 10 Minutes -- -- -- --      Treadmill   MPH 1.4 -- 1.5 2 3    Grade 0 -- 0.5 1.5 3   Minutes 17 -- 15 15 15    METs 2.07 -- 2.25 2.95 4.54     NuStep   Level 1 -- 2 4 4    SPM 54 -- 73 89 85   Minutes 22 -- 15 15 15    METs 1.73 -- 2 2.95 2.7     Home Exercise Plan   Plans to continue exercise at -- Home (comment)  walking, weights -- -- --   Frequency -- Add 2 additional days to program exercise sessions. -- -- --   Initial Home Exercises Provided -- 07/12/23 -- -- --     Oxygen   Maintain Oxygen Saturation -- -- 88% or higher 88% or higher 88% or higher    Row Name 09/09/23 1300             Response to Exercise   Blood Pressure (Admit) 118/72       Blood Pressure (Exit) 102/62       Heart Rate (Admit) 88 bpm  Heart Rate (Exercise) 110 bpm       Heart Rate (Exit) 84 bpm       Rating of Perceived Exertion (Exercise) 15       Duration Continue with 30 min of aerobic exercise without signs/symptoms of physical distress.       Intensity THRR unchanged         Progression   Progression Continue to progress workloads to maintain intensity without signs/symptoms of physical distress.         Resistance Training   Training Prescription Yes       Weight 5       Reps 10-15         Treadmill   MPH 3.5       Grade 1       Minutes 15       METs 5.61         NuStep   Level 6       SPM 67       Minutes 15       METs 3         Home Exercise Plan   Plans to continue exercise at Home (comment)       Frequency Add 2 additional days to program exercise sessions.         Oxygen   Maintain Oxygen Saturation 88% or higher                Exercise Comments:   Exercise Goals and Review:   Exercise Goals     Row Name 06/28/23 1340 07/15/23 1553           Exercise Goals   Increase Physical Activity Yes Yes      Intervention Provide advice, education, support and counseling about physical activity/exercise needs.;Develop an individualized exercise prescription for aerobic and resistive  training based on initial evaluation findings, risk stratification, comorbidities and participant's personal goals. Provide advice, education, support and counseling about physical activity/exercise needs.;Develop an individualized exercise prescription for aerobic and resistive training based on initial evaluation findings, risk stratification, comorbidities and participant's personal goals.      Expected Outcomes Short Term: Attend rehab on a regular basis to increase amount of physical activity.;Long Term: Add in home exercise to make exercise part of routine and to increase amount of physical activity.;Long Term: Exercising regularly at least 3-5 days a week. Short Term: Attend rehab on a regular basis to increase amount of physical activity.;Long Term: Add in home exercise to make exercise part of routine and to increase amount of physical activity.;Long Term: Exercising regularly at least 3-5 days a week.      Increase Strength and Stamina Yes Yes      Intervention Provide advice, education, support and counseling about physical activity/exercise needs.;Develop an individualized exercise prescription for aerobic and resistive training based on initial evaluation findings, risk stratification, comorbidities and participant's personal goals. Provide advice, education, support and counseling about physical activity/exercise needs.;Develop an individualized exercise prescription for aerobic and resistive training based on initial evaluation findings, risk stratification, comorbidities and participant's personal goals.      Expected Outcomes Short Term: Increase workloads from initial exercise prescription for resistance, speed, and METs.;Short Term: Perform resistance training exercises routinely during rehab and add in resistance training at home;Long Term: Improve cardiorespiratory fitness, muscular endurance and strength as measured by increased METs and functional capacity ( ) Short Term: Increase  workloads from initial exercise prescription for resistance, speed, and METs.;Short Term: Perform resistance training  exercises routinely during rehab and add in resistance training at home;Long Term: Improve cardiorespiratory fitness, muscular endurance and strength as measured by increased METs and functional capacity ( )      Able to understand and use rate of perceived exertion (RPE) scale Yes Yes      Intervention Provide education and explanation on how to use RPE scale Provide education and explanation on how to use RPE scale      Expected Outcomes Short Term: Able to use RPE daily in rehab to express subjective intensity level;Long Term:  Able to use RPE to guide intensity level when exercising independently Short Term: Able to use RPE daily in rehab to express subjective intensity level;Long Term:  Able to use RPE to guide intensity level when exercising independently      Knowledge and understanding of Target Heart Rate Range (THRR) Yes Yes      Intervention Provide education and explanation of THRR including how the numbers were predicted and where they are located for reference Provide education and explanation of THRR including how the numbers were predicted and where they are located for reference      Expected Outcomes Short Term: Able to state/look up THRR;Long Term: Able to use THRR to govern intensity when exercising independently;Short Term: Able to use daily as guideline for intensity in rehab Short Term: Able to state/look up THRR;Long Term: Able to use THRR to govern intensity when exercising independently;Short Term: Able to use daily as guideline for intensity in rehab      Able to check pulse independently Yes Yes      Intervention Provide education and demonstration on how to check pulse in carotid and radial arteries.;Review the importance of being able to check your own pulse for safety during independent exercise Provide education and demonstration on how to check pulse in  carotid and radial arteries.;Review the importance of being able to check your own pulse for safety during independent exercise      Expected Outcomes Short Term: Able to explain why pulse checking is important during independent exercise;Long Term: Able to check pulse independently and accurately Short Term: Able to explain why pulse checking is important during independent exercise;Long Term: Able to check pulse independently and accurately      Understanding of Exercise Prescription Yes Yes      Intervention Provide education, explanation, and written materials on patient's individual exercise prescription Provide education, explanation, and written materials on patient's individual exercise prescription      Expected Outcomes Short Term: Able to explain program exercise prescription;Long Term: Able to explain home exercise prescription to exercise independently Short Term: Able to explain program exercise prescription;Long Term: Able to explain home exercise prescription to exercise independently               Exercise Goals Re-Evaluation :  Exercise Goals Re-Evaluation     Row Name 07/12/23 1134 07/15/23 1553 07/26/23 1131 07/29/23 1342 07/29/23 1353     Exercise Goal Re-Evaluation   Exercise Goals Review Increase Physical Activity;Increase Strength and Stamina;Able to understand and use rate of perceived exertion (RPE) scale;Able to understand and use Dyspnea scale;Knowledge and understanding of Target Heart Rate Range (THRR);Able to check pulse independently;Understanding of Exercise Prescription Increase Physical Activity;Increase Strength and Stamina;Able to understand and use rate of perceived exertion (RPE) scale;Able to check pulse independently;Knowledge and understanding of Target Heart Rate Range (THRR);Understanding of Exercise Prescription Increase Physical Activity;Increase Strength and Stamina;Understanding of Exercise Prescription -- --   Comments Reviewed home  exercise with pt  today.  Pt plans to walk and use weights at home for exercise.  Reviewed THR, pulse, RPE, sign and symptoms, pulse oximetery and when to call 911 or MD.  Also discussed weather considerations and indoor options.  Pt voiced understanding. Pt has completed 8 sessions of cardiac rehab. He is motivated during class to exercise and plans to exercise at home on his days out of class. He is currently exerciisng at 2.25 METs on the treadmill. Will continue to monitor and progress as able, Patient is doing well in the program. He continues to be motivated during class. He is walking at home for exercise. He walks in his neighbor using a pokiemon go. He does this on tlhe days he does not come to CR and is able to increase his distance. Pating is increasing his levels on both hte treadmill and the stepper. HE continues to be motivated anf enjpys coming to rehab Nate is increasing his workloads on both the treadmill and stepper. He is enjoying coming to rehab.   Expected Outcomes Short: Start to add in exerise at home on off days Long; Continue to exercise independently Through exercise at rehab and home, patient will achieve their goals. Short term: Patient will continue to exercise by walking at home. Long term: Patient will continue to work toward increasing his strength and stamina and improve his SOB. -- --    Row Name 08/16/23 1359 08/19/23 1114           Exercise Goal Re-Evaluation   Exercise Goals Review Increase Physical Activity;Understanding of Exercise Prescription;Increase Strength and Stamina Increase Physical Activity;Increase Strength and Stamina;Understanding of Exercise Prescription      Comments Nate has increased his speed on the treadmill with a speed of 3.0 and grade of 3.0. Will continue to monitor and progress as able, Nate is doing well in rehab.  He is walking at home on his off days for 45 min each way for a total 90 min.  He has noted that he has been getting dizzy during the walk.  We  talked about making sure he is getting enough water.  He has noticed that his stamina is increasing and he is feeling better overall.      Expected Outcomes Short term: increase level on the NuStep to level 5   long term: continue to attend cardiac rehab sessions Short: continue to walk and drink more Long: Conitnue to improve stamina                Discharge Exercise Prescription (Final Exercise Prescription Changes):  Exercise Prescription Changes - 09/09/23 1300       Response to Exercise   Blood Pressure (Admit) 118/72    Blood Pressure (Exit) 102/62    Heart Rate (Admit) 88 bpm    Heart Rate (Exercise) 110 bpm    Heart Rate (Exit) 84 bpm    Rating of Perceived Exertion (Exercise) 15    Duration Continue with 30 min of aerobic exercise without signs/symptoms of physical distress.    Intensity THRR unchanged      Progression   Progression Continue to progress workloads to maintain intensity without signs/symptoms of physical distress.      Resistance Training   Training Prescription Yes    Weight 5    Reps 10-15      Treadmill   MPH 3.5    Grade 1    Minutes 15    METs 5.61  NuStep   Level 6    SPM 67    Minutes 15    METs 3      Home Exercise Plan   Plans to continue exercise at Home (comment)    Frequency Add 2 additional days to program exercise sessions.      Oxygen   Maintain Oxygen Saturation 88% or higher             Nutrition:  Target Goals: Understanding of nutrition guidelines, daily intake of sodium 1500mg , cholesterol 200mg , calories 30% from fat and 7% or less from saturated fats, daily to have 5 or more servings of fruits and vegetables.  Biometrics:  Pre Biometrics - 06/28/23 1341       Pre Biometrics   Height 5\' 10"  (1.778 m)    Weight 154 lb 1.6 oz (69.9 kg)    Waist Circumference 35 inches    Hip Circumference 36 inches    Waist to Hip Ratio 0.97 %    BMI (Calculated) 22.11    Triceps Skinfold 5 mm    % Body Fat 17.9 %     Grip Strength 20.6 kg    Flexibility 0 in    Single Leg Stand 0 seconds             Post Biometrics - 09/06/23 1130        Post  Biometrics   Height 5\' 10"  (1.778 m)    Weight 164 lb 11.2 oz (74.7 kg)    Waist Circumference 34 inches    Hip Circumference 35 inches    Waist to Hip Ratio 0.97 %    BMI (Calculated) 23.63    Grip Strength 25.2 kg    Single Leg Stand 7 seconds             Nutrition Therapy Plan and Nutrition Goals:  Nutrition Therapy & Goals - 07/08/23 1035       Nutrition Therapy   RD appointment deferred Yes      Personal Nutrition Goals   Comments We provide educational sessions on heart healthy nutrition with handouts.      Intervention Plan   Intervention Nutrition handout(s) given to patient.    Expected Outcomes Short Term Goal: Understand basic principles of dietary content, such as calories, fat, sodium, cholesterol and nutrients.             Nutrition Assessments:  Nutrition Assessments - 06/28/23 1320       MEDFICTS Scores   Pre Score 30            MEDIFICTS Score Key: >=70 Need to make dietary changes  40-70 Heart Healthy Diet <= 40 Therapeutic Level Cholesterol Diet   Picture Your Plate Scores: <16 Unhealthy dietary pattern with much room for improvement. 41-50 Dietary pattern unlikely to meet recommendations for good health and room for improvement. 51-60 More healthful dietary pattern, with some room for improvement.  >60 Healthy dietary pattern, although there may be some specific behaviors that could be improved.    Nutrition Goals Re-Evaluation:  Nutrition Goals Re-Evaluation     Row Name 07/26/23 1119 08/19/23 1123           Goals   Nutrition Goal Patient wants to decrease the amount of ice cream he eats and eat more healthy meat. Short Term: cut back on ice cream and increase healthy proteins. Long term: Continue eating healthy and adquate calorie intake.      Comment Patinet says he is eating a lot  of salads, fruits especially blueberriers and vegetables. He does not eat any fried foods or a lot of meat. He says he knows he is eating too much ice cream. He is working on cutting back. We will continue to monitor. Nate is doing well in rehab.  He admits to needing to eat better.  He has still been eating his ice cream and cheeseburgers.  His taste buds are back and he is enjoying his eating again.  We talked about picking healthier options and getting the ones with less sugar.  He is still eating salads to get in his vegetables in.      Expected Outcome Short Term: cut back on ice cream and increase healthy proteins. Long term: Continue eating healthy and adquate calorie intake. Short: Cut back on ice cream Long: Try to aim for healthier versions of favortie snacks.               Nutrition Goals Discharge (Final Nutrition Goals Re-Evaluation):  Nutrition Goals Re-Evaluation - 08/19/23 1123       Goals   Nutrition Goal Short Term: cut back on ice cream and increase healthy proteins. Long term: Continue eating healthy and adquate calorie intake.    Comment Nate is doing well in rehab.  He admits to needing to eat better.  He has still been eating his ice cream and cheeseburgers.  His taste buds are back and he is enjoying his eating again.  We talked about picking healthier options and getting the ones with less sugar.  He is still eating salads to get in his vegetables in.    Expected Outcome Short: Cut back on ice cream Long: Try to aim for healthier versions of favortie snacks.             Psychosocial: Target Goals: Acknowledge presence or absence of significant depression and/or stress, maximize coping skills, provide positive support system. Participant is able to verbalize types and ability to use techniques and skills needed for reducing stress and depression.  Initial Review & Psychosocial Screening:  Initial Psych Review & Screening - 06/28/23 1335       Initial Review    Current issues with Current Anxiety/Panic;History of Depression;Current Depression;Current Stress Concerns    Source of Stress Concerns Family;Financial    Comments Patient lost his job when he has his CABG. He is not able to pay child support and his son's mother does not allow him to have a relationship with his son and this along with no income is very stressful.      Family Dynamics   Good Support System? Yes      Barriers   Psychosocial barriers to participate in program There are no identifiable barriers or psychosocial needs.      Screening Interventions   Interventions Encouraged to exercise;To provide support and resources with identified psychosocial needs;Provide feedback about the scores to participant    Expected Outcomes Long Term goal: The participant improves quality of Life and PHQ9 Scores as seen by post scores and/or verbalization of changes;Short Term goal: Utilizing psychosocial counselor, staff and physician to assist with identification of specific Stressors or current issues interfering with healing process. Setting desired goal for each stressor or current issue identified.;Long Term Goal: Stressors or current issues are controlled or eliminated.;Short Term goal: Identification and review with participant of any Quality of Life or Depression concerns found by scoring the questionnaire.             Quality of Life  Scores:  Quality of Life - 06/28/23 1341       Quality of Life   Select Quality of Life      Quality of Life Scores   Health/Function Pre 26 %    Socioeconomic Pre 30 %    Psych/Spiritual Pre 30 %    Family Pre 30 %    GLOBAL Pre 28.29 %            Scores of 19 and below usually indicate a poorer quality of life in these areas.  A difference of  2-3 points is a clinically meaningful difference.  A difference of 2-3 points in the total score of the Quality of Life Index has been associated with significant improvement in overall quality of life,  self-image, physical symptoms, and general health in studies assessing change in quality of life.  PHQ-9: Review Flowsheet       08/19/2023 06/28/2023  Depression screen PHQ 2/9  Decreased Interest 0 0  Down, Depressed, Hopeless 0 3  PHQ - 2 Score 0 3  Altered sleeping 2 3  Tired, decreased energy 0 3  Change in appetite 0 3  Feeling bad or failure about yourself  0 3  Trouble concentrating 0 3  Moving slowly or fidgety/restless 0 0  Suicidal thoughts 0 0  PHQ-9 Score 2 18  Difficult doing work/chores Somewhat difficult Extremely dIfficult    Details           Interpretation of Total Score  Total Score Depression Severity:  1-4 = Minimal depression, 5-9 = Mild depression, 10-14 = Moderate depression, 15-19 = Moderately severe depression, 20-27 = Severe depression   Psychosocial Evaluation and Intervention:  Psychosocial Evaluation - 06/28/23 1337       Psychosocial Evaluation & Interventions   Interventions Stress management education;Relaxation education;Encouraged to exercise with the program and follow exercise prescription    Comments Patient has no psychosocial barriers to participate in CR identified at his orientation visit. His PHQ-9 score was 18. He says he has had depression and anxiety most all of his life. He says he has never tried any treatment and does not want to at this time. He feels like he is able to manage and cope on his own. 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 He is interested in vocational rehab. He lives with his mother. He did state he has been homeless a few years ago but his mother moved inorder to allow him to live with her. He has a 67 year old son. He says the son's mother moves around a lot and does not allow him to have a relationship with him and this "bothers" him a lot along with his lack of financial income. He says his mother does support him with some of his bills, food, and transportation. He says  he looking forward to participating in the program hoping to get stronger and be able to get employment.    Expected Outcomes Patient will continue to have no psychosocial barriers identified.    Continue Psychosocial Services  No Follow up required             Psychosocial Re-Evaluation:  Psychosocial Re-Evaluation     Row Name 07/08/23 1036 07/26/23 1107 08/19/23 1116         Psychosocial Re-Evaluation   Current issues with History of Depression;Current Anxiety/Panic History of Depression;Current Anxiety/Panic History of Depression;Current Anxiety/Panic     Comments Patient  is new to the program. He has completed 4 sessions. He continues to have no psychosocial barriers identified. He continues to feels his depression and anxiety are managed without treatment. He seems to enjoy the sessions and demonstrates an interest in improving his health. Patient doing well in the program. He states his SOB is improving and he is able to walk to the end of his block without fatigue or geting SOB. He feels he is geting stronger and his stamina is improving. His mother has been his transportation source and support and she is currently out of town caring for a family member. His brother is bringing him to CR and "takig care of him" he feels this has been a stressor for his brother. His mother is coming home this weekend and he is glad. He has ongoing stress over not being able to have a relationship with his son who lives in [place] with his mother. He says she has blocked him from all communication. He feels he mananges his stress as best he can. He is working with vocational rehab to hopefully find employment. He says he still worries because he has to pay 5 years back pay for child support. His surgeon has not released him to work. He has appointment with him in September and hopes to be released. We will continue to monitor. Nate is doing well in rehab.  He is feeling good overall and feels that his  mental health is improving.  He has not been sleeping well and has constant trips to bathroom and then has difficulty getting back to sleep which does mess with his mentality.  Overall, his PHQ has gone from 18  down to 2!!  He says it's from moving more and feeling better overall. He would like to try melatonin to help with sleep.  He was cautioned against using nightly and try with small dose. We also talked about no blue light screens for 30 min before bed and no TV on at bedtimes.     Expected Outcomes Patient will continue to have no psychosocial barriers identified. Goals: Short Term: Continue to work with vocational rehab. 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 Short: Try melatonin and getting away from screens prior to bed time Long: Conitnue to exercise for mental boost.     Interventions Stress management education;Relaxation education;Encouraged to attend Cardiac Rehabilitation for the exercise Stress management education;Encouraged to attend Cardiac Rehabilitation for the exercise;Relaxation education Stress management education;Encouraged to attend Cardiac Rehabilitation for the exercise     Continue Psychosocial Services  No Follow up required No Follow up required Follow up required by staff              Psychosocial Discharge (Final Psychosocial Re-Evaluation):  Psychosocial Re-Evaluation - 08/19/23 1116       Psychosocial Re-Evaluation   Current issues with History of Depression;Current Anxiety/Panic    Comments Nate is doing well in rehab.  He is feeling good overall and feels that his mental health is improving.  He has not been sleeping well and has constant trips to bathroom and then has difficulty getting back to sleep which does mess with his mentality.  Overall, his PHQ has gone from 18  down to 2!!  He says it's from moving more and feeling better overall. He would like to try melatonin to help with sleep.  He was cautioned  against using nightly and try with small dose. We also talked  about no blue light screens for 30 min before bed and no TV on at bedtimes.    Expected Outcomes Short: Try melatonin and getting away from screens prior to bed time Long: Conitnue to exercise for mental boost.    Interventions Stress management education;Encouraged to attend Cardiac Rehabilitation for the exercise    Continue Psychosocial Services  Follow up required by staff             Vocational Rehabilitation: Provide vocational rehab assistance to qualifying candidates.   Vocational Rehab Evaluation & Intervention:  Vocational Rehab - 07/26/23 1117       Vocational Rehab Re-Evaulation   Comments Patient has received documentation from [facility] and is working on completing it. He says they will make an inperson appointment when he turns his paperwork in. He plans to get this completed soon. We will continue to monitor.             Education: Education Goals: Education classes will be provided on a weekly basis, covering required topics. Participant will state understanding/return demonstration of topics presented.  Learning Barriers/Preferences:  Learning Barriers/Preferences - 06/28/23 1322       Learning Barriers/Preferences   Learning Barriers None    Learning Preferences Written Material;Audio;Skilled Demonstration             Education Topics: Hypertension, Hypertension Reduction -Define heart disease and high blood pressure. Discus how high blood pressure affects the body and ways to reduce high blood pressure. Flowsheet Row CARDIAC REHAB PHASE II EXERCISE from 08/28/2023 in [place] CARDIAC REHABILITATION  Date 07/03/23  Educator HB  Instruction Review Code 1- Verbalizes Understanding       Exercise and Your Heart -Discuss why it is important to exercise, the FITT principles of exercise, normal and abnormal responses to exercise, and how to exercise safely. Flowsheet Row CARDIAC REHAB PHASE  II EXERCISE from 08/28/2023 in [place] CARDIAC REHABILITATION  Date 07/10/23  Educator [facility]  Instruction Review Code 1- Verbalizes Understanding       Angina -Discuss definition of angina, causes of angina, treatment of angina, and how to decrease risk of having angina. Flowsheet Row CARDIAC REHAB PHASE II EXERCISE from 08/28/2023 in [place] CARDIAC REHABILITATION  Date 07/17/23  Educator HW  Instruction Review Code 1- Verbalizes Understanding       Cardiac Medications -Review what the following cardiac medications are used for, how they affect the body, and side effects that may occur when taking the medications.  Medications include Aspirin, Beta blockers, calcium channel blockers, ACE Inhibitors, angiotensin receptor blockers, diuretics, digoxin, and antihyperlipidemics. Flowsheet Row CARDIAC REHAB PHASE II EXERCISE from 08/28/2023 in [place] CARDIAC REHABILITATION  Date 07/24/23  Educator HB  Instruction Review Code 1- Verbalizes Understanding       Congestive Heart Failure -Discuss the definition of CHF, how to live with CHF, the signs and symptoms of CHF, and how keep track of weight and sodium intake.   Heart Disease and Intimacy -Discus the effect sexual activity has on the heart, how changes occur during intimacy as we age, and safety during sexual activity. Flowsheet Row CARDIAC REHAB PHASE II EXERCISE from 08/28/2023 in [place] CARDIAC REHABILITATION  Date 08/07/23  Educator [facility]  Instruction Review Code 1- Verbalizes Understanding       Smoking Cessation / COPD -Discuss different methods to quit smoking, the health benefits of quitting smoking, and the definition of COPD.   Nutrition I: Fats -Discuss the types of cholesterol, what cholesterol does  to the heart, and how cholesterol levels can be controlled.   Nutrition II: Labels -Discuss the different components of food labels and how to read food label   Heart Parts/Heart Disease and  PAD -Discuss the anatomy of the heart, the pathway of blood circulation through the heart, and these are affected by heart disease.   Stress I: Signs and Symptoms -Discuss the causes of stress, how stress may lead to anxiety and depression, and ways to limit stress. Flowsheet Row CARDIAC REHAB PHASE II EXERCISE from 08/28/2023 in [place] [employer] [place] CARDIAC REHABILITATION  Date 08/21/23  Educator [facility]  Instruction Review Code 2- Demonstrated Understanding       Stress II: Relaxation -Discuss different types of relaxation techniques to limit stress. Flowsheet Row CARDIAC REHAB PHASE II EXERCISE from 08/28/2023 in [place] CARDIAC REHABILITATION  Date 08/21/23  Educator [facility]  Instruction Review Code 2- Demonstrated Understanding       Warning Signs of Stroke / TIA -Discuss definition of a stroke, what the signs and symptoms are of a stroke, and how to identify when someone is having stroke.   Knowledge Questionnaire Score:  Knowledge Questionnaire Score - 06/28/23 1320       Knowledge Questionnaire Score   Pre Score 22/24             Core Components/Risk Factors/Patient Goals at Admission:  Personal Goals and Risk Factors at Admission - 06/28/23 1323       Core Components/Risk Factors/Patient Goals on Admission    Weight Management Weight Maintenance    Improve shortness of breath with ADL's Yes    Intervention Provide education, individualized exercise plan and daily activity instruction to help decrease symptoms of SOB with activities of daily living.    Expected Outcomes Short Term: Improve cardiorespiratory fitness to achieve a reduction of symptoms when performing ADLs;Long Term: Be able to perform more ADLs without symptoms or delay the onset of symptoms    Hypertension Yes    Intervention Provide education on lifestyle modifcations including regular physical activity/exercise, weight management, moderate sodium restriction and increased consumption of fresh fruit,  vegetables, and low fat dairy, alcohol moderation, and smoking cessation.;Monitor prescription use compliance.    Expected Outcomes Short Term: Continued assessment and intervention until BP is < 140/95mm HG in hypertensive participants. < 130/26mm HG in hypertensive participants with diabetes, heart failure or chronic kidney disease.;Long Term: Maintenance of blood pressure at goal levels.    Lipids Yes    Intervention Provide education and support for participant on nutrition & aerobic/resistive exercise along with prescribed medications to achieve LDL 70mg , HDL >40mg .    Expected Outcomes Short Term: Participant states understanding of desired cholesterol values and is compliant with medications prescribed. Participant is following exercise prescription and nutrition guidelines.;Long Term: Cholesterol controlled with medications as prescribed, with individualized exercise RX and with personalized nutrition plan. Value goals: LDL < 70mg , HDL > 40 mg.    Stress Yes    Intervention Offer individual and/or small group education and counseling on adjustment to heart disease, stress management and health-related lifestyle change. Teach and support self-help strategies.;Refer participants experiencing significant psychosocial distress to appropriate mental health specialists for further evaluation and treatment. When possible, include family members and significant others in education/counseling sessions.    Expected Outcomes Short Term: Participant demonstrates changes in health-related behavior, relaxation and other stress management skills, ability to obtain effective social support, and compliance with psychotropic medications if prescribed.;Long Term: Emotional wellbeing is indicated by absence of clinically significant  psychosocial distress or social isolation.    Personal Goal Other Yes    Personal Goal Patient wants to improve his strength and stamina; Walk longer distance without getting SOB; and be  able to go back to work.    Intervention Patient will attend CR 3 days/week with exercise and education.    Expected Outcomes Patient will complete the program meeting both personal and program goals.             Core Components/Risk Factors/Patient Goals Review:   Goals and Risk Factor Review     Row Name 07/08/23 1038 07/26/23 1121 08/19/23 1125         Core Components/Risk Factors/Patient Goals Review   Personal Goals Review Weight Management/Obesity;Lipids;Hypertension;Stress;Other Weight Management/Obesity;Lipids;Hypertension Weight Management/Obesity;Lipids;Hypertension     Review Patient was referred to CR with [facility]. He has multiple risk factors for CAD and is participating in the program for risk modificaiton. He has completed 4 sessions. His current weight is 155.0 lbs losing 0.7 bls from his intial visit. He is doing well in the program. His blood pressure is at goal. 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 We will continue to monitor his progress as he works towards meeting these goals. Patient is doing well in the program with progressions and consistent attendance. His blood pressure continues to be well controlled. He does montior his blood pressure at home and it is always below 110 systolic. He says some mornings it is in the 90's and below 90 a few occaions. He denies any chronic dizziness. He says he get dizzy occasionally when he gets up too quickly I encouaged him to continue to monitor his blood pressue and takes his readings in when he sees [place], NP and tell her about the low readings and positional dizziness. He is maintaining his weight. His current weight 152 lbs. He has lost 5 lbs from his initial weight. He is compliant with his medications. He has an upcoming ECHO and an appointmet to see E. Philis Nettle 8/15. No recent lipid panel on file. We will  continue to monitor. Nate is doing well in reheb.  His weight has been going up since he has been eating again.  His weight is up about 20 lb since starting and he feels he is gaining too fast from eating poorly.  We talked about how his grafts could occlude too if he does not adapt healthier eating habits.  His pressures are doing well for most part, however, recently they have been too low in the 90s for him.  He has noted some dizzy spells but did not attribute it to anything other than maybe dehydrated so he was encouraged to check his pressure when he feels dizzy and to report numbers to doctor to help with BP issues.     Expected Outcomes Patient will complete the program meeting both personal and program goals. Short Term: Long term Goals: blood pressure will continue to be at goal and weight maintained. Short: Keep close eye on pressures and dizzy spells Long: Conitnue to work on weight loss again with improving diet.              Core Components/Risk Factors/Patient Goals at Discharge (Final Review):   Goals and Risk Factor Review - 08/19/23 1125       Core Components/Risk Factors/Patient Goals Review  Personal Goals Review Weight Management/Obesity;Lipids;Hypertension    Review Nate is doing well in reheb.  His weight has been going up since he has been eating again.  His weight is up about 20 lb since starting and he feels he is gaining too fast from eating poorly.  We talked about how his grafts could occlude too if he does not adapt healthier eating habits.  His pressures are doing well for most part, however, recently they have been too low in the 90s for him.  He has noted some dizzy spells but did not attribute it to anything other than maybe dehydrated so he was encouraged to check his pressure when he feels dizzy and to report numbers to doctor to help with BP issues.    Expected Outcomes Short: Keep close eye on pressures and dizzy spells Long: Conitnue to work on weight loss  again with improving diet.             ITP Comments:  ITP Comments     Row Name 07/05/23 1259 07/17/23 1304 08/14/23 0815 08/30/23 1115 09/11/23 0817   ITP Comments Gave patient vocational rehab packet 07/03/23. He returned it and it was faxed to vocational rehab 07/05/23. 30 day review completed. ITP sent to Dr. Dina Rich, Medical Director of Cardiac Rehab. Continue with ITP unless changes are made by physician. 30 day review completed. ITP sent to Dr. Dina Rich, Medical Director of Cardiac Rehab. Continue with ITP unless changes are made by physician. Nathans BP started out lower today 82/60; he was given 2 bottles of water and rechecked 94/64 and also rechecked buring exercise 96/60 30 day review completed. ITP sent to Dr. Dina Rich, Medical Director of Cardiac Rehab. Continue with ITP unless changes are made by physician.            Comments: 30 day review

## 2023-09-13 ENCOUNTER — Encounter (HOSPITAL_COMMUNITY)
Admission: RE | Admit: 2023-09-13 | Discharge: 2023-09-13 | Disposition: A | Discharge status: Home / Self Care | Payer: Medicaid Other | Source: Ambulatory Visit | Attending: Cardiology

## 2023-09-13 ENCOUNTER — Encounter (HOSPITAL_COMMUNITY): Payer: Medicaid Other

## 2023-09-13 DIAGNOSIS — Z951 Presence of aortocoronary bypass graft: Secondary | ICD-10-CM

## 2023-09-13 DIAGNOSIS — I214 Non-ST elevation (NSTEMI) myocardial infarction: Secondary | ICD-10-CM | POA: Diagnosis not present

## 2023-09-13 NOTE — Progress Notes (Signed)
Daily Session Note  Patient Details  Name: Caleb Perkins MRN: [id number] Date of Birth: 06-Nov-1976 Referring Provider:   Flowsheet Row CARDIAC REHAB PHASE II ORIENTATION from 06/28/2023 in [facility] C[facility]IAC [facility]  Referring Provider Dr. Dorris Fetch       Encounter Date: 09/13/2023  Check In:  Session Check In - 09/13/23 1100       Check-In   Supervising physician immediately available to respond to emergencies See telemetry face sheet for immediately available MD    Location [facility]    Staff Present Ross Ludwig, BS, Exercise Physiologist;Debra Laural Benes, RN, Pleas Koch, RN, BSN;Jessica Hawkins, MA, RCEP, CCRP, CCET    Virtual Visit No    Medication changes reported     No    Fall or balance concerns reported    No    Tobacco Cessation No Change    Warm-up and Cool-down Performed on first and last piece of equipment    Resistance Training Performed Yes    VAD Patient? No    PAD/SET Patient? No      Pain Assessment   Currently in Pain? No/denies    Pain Score 0-No pain    Multiple Pain Sites No             Capillary Blood Glucose: No results found for this or any previous visit (from the past 24 hour(s)).    Social History   Tobacco Use  Smoking Status Former   Current packs/day: 0.00   Types: Cigarettes   Quit date: 04/22/2023   Years since quitting: 0.3  Smokeless Tobacco Never    Goals Met:  Independence with exercise equipment Exercise tolerated well No report of concerns or symptoms today Strength training completed today  Goals Unmet:  Not Applicable  Comments: Pt able to follow exercise prescription today without complaint.  Will continue to monitor for progression.    Dr. Dina Rich is Medical Director for [facility] Card[facility]

## 2023-09-16 ENCOUNTER — Encounter (HOSPITAL_COMMUNITY)
Admission: RE | Admit: 2023-09-16 | Discharge: 2023-09-16 | Disposition: A | Discharge status: Home / Self Care | Payer: Medicaid Other | Source: Ambulatory Visit | Attending: Cardiology

## 2023-09-16 ENCOUNTER — Encounter (HOSPITAL_COMMUNITY): Payer: Medicaid Other

## 2023-09-16 DIAGNOSIS — I214 Non-ST elevation (NSTEMI) myocardial infarction: Secondary | ICD-10-CM | POA: Diagnosis not present

## 2023-09-16 DIAGNOSIS — Z951 Presence of aortocoronary bypass graft: Secondary | ICD-10-CM

## 2023-09-16 NOTE — Progress Notes (Signed)
Daily Session Note  Patient Details  Name: Caleb Perkins MRN: [id number] Date of Birth: 30-Dec-1976 Referring Provider:   Flowsheet Row CARDIAC REHAB PHASE II ORIENTATION from 06/28/2023 in [facility] CARDIAC REHABILITATION  Referring Provider Dr. Dorris Fetch       Encounter Date: 09/16/2023  Check In:  Session Check In - 09/16/23 1100       Check-In   Supervising physician immediately available to respond to emergencies See telemetry face sheet for immediately available MD    Location [facility]    Staff Present Ross Ludwig, BS, Exercise Physiologist;Debra Laural Benes, RN, BSN;Jamar Weatherall, RN;Jessica [place], MA, RCEP, CCRP, CCET    Virtual Visit No    Medication changes reported     No    Fall or balance concerns reported    No    Tobacco Cessation No Change    Warm-up and Cool-down Performed on first and last piece of equipment    Resistance Training Performed Yes    VAD Patient? No    PAD/SET Patient? No      Pain Assessment   Currently in Pain? No/denies    Pain Score 0-No pain    Multiple Pain Sites No             Capillary Blood Glucose: No results found for this or any previous visit (from the past 24 hour(s)).    Social History   Tobacco Use  Smoking Status Former   Current packs/day: 0.00   Types: Cigarettes   Quit date: 04/22/2023   Years since quitting: 0.4  Smokeless Tobacco Never    Goals Met:  Independence with exercise equipment Exercise tolerated well No report of concerns or symptoms today  Goals Unmet:  Not Applicable  Comments: Pt able to follow exercise prescription today without complaint.  Will continue to monitor for progression.    Dr. Dina Rich is Medical Director for [facility] Cardiac Rehab

## 2023-09-18 ENCOUNTER — Encounter (HOSPITAL_COMMUNITY)
Admission: RE | Admit: 2023-09-18 | Discharge: 2023-09-18 | Disposition: A | Discharge status: Home / Self Care | Payer: Medicaid Other | Source: Ambulatory Visit | Attending: Cardiology | Admitting: Cardiology

## 2023-09-18 ENCOUNTER — Encounter (HOSPITAL_COMMUNITY): Payer: Medicaid Other

## 2023-09-18 DIAGNOSIS — Z951 Presence of aortocoronary bypass graft: Secondary | ICD-10-CM

## 2023-09-18 DIAGNOSIS — I214 Non-ST elevation (NSTEMI) myocardial infarction: Secondary | ICD-10-CM

## 2023-09-18 NOTE — Patient Instructions (Signed)
Discharge Patient Instructions  Patient Details  Name: Caleb Perkins MRN: [id number] Date of Birth: [birth year] Referring Provider:  Ponciano Ort [facility]   Number of Visits: 77  Reason for Discharge:  Patient reached a stable level of exercise. Patient independent in their exercise. Patient has met program and personal goals.  Smoking History:  Social History   Tobacco Use  Smoking Status Former   Current packs/day: 0.00   Types: Cigarettes   Quit date: 04/22/2023   Years since quitting: 0.4  Smokeless Tobacco Never    Diagnosis:  S/P CABG x 5  NSTEMI (non-ST elevated myocardial infarction) [facility])  Initial Exercise Prescription:  Initial Exercise Prescription - 06/28/23 1300       Date of Initial Exercise RX and Referring Provider   Date 06/28/23    Referring Provider Dr. Dorris Fetch    Expected Discharge Date 09/18/23      Treadmill   MPH 1.4    Grade 0    Minutes 17      NuStep   Level 1    SPM 60    Minutes 22      Prescription Details   Frequency (times per week) 3    Duration Progress to 30 minutes of continuous aerobic without signs/symptoms of physical distress      Intensity   THRR 40-80% of Max Heartrate 70-141    Ratings of Perceived Exertion 11-13      Resistance Training   Training Prescription Yes    Weight 4    Reps 10-15             Discharge Exercise Prescription (Final Exercise Prescription Changes):  Exercise Prescription Changes - 09/09/23 1300       Response to Exercise   Blood Pressure (Admit) 118/72    Blood Pressure (Exit) 102/62    Heart Rate (Admit) 88 bpm    Heart Rate (Exercise) 110 bpm    Heart Rate (Exit) 84 bpm    Rating of Perceived Exertion (Exercise) 15    Duration Continue with 30 min of aerobic exercise without signs/symptoms of physical distress.    Intensity THRR unchanged      Progression   Progression Continue to progress workloads to maintain intensity without signs/symptoms of physical  distress.      Resistance Training   Training Prescription Yes    Weight 5    Reps 10-15      Treadmill   MPH 3.5    Grade 1    Minutes 15    METs 5.61      NuStep   Level 6    SPM 67    Minutes 15    METs 3      Home Exercise Plan   Plans to continue exercise at Home (comment)    Frequency Add 2 additional days to program exercise sessions.      Oxygen   Maintain Oxygen Saturation 88% or higher             Functional Capacity:  6 Minute Walk     Row Name 06/28/23 1338 09/06/23 1129       6 Minute Walk   Phase Initial Discharge    Distance 1350 feet 1430 feet  initial test rescored distance  1290 ft    Distance % Change -- 10.8 %    Distance Feet Change -- 140 ft    Walk Time 6 minutes 6 minutes    # of Rest Breaks 0 0  MPH 2.55 2.71    METS 4.39 4.92    RPE 12 14    VO2 Peak 15.37 17.22    Symptoms No No    Resting HR 69 bpm 76 bpm    Resting BP 102/76 124/62    Resting Oxygen Saturation  97 % --    Exercise Oxygen Saturation  during 6 min walk 97 % --    Max Ex. HR 66 bpm 117 bpm    Max Ex. BP 116/70 128/62    2 Minute Post BP 108/70 --            Nutrition & Weight - Outcomes:  Pre Biometrics - 06/28/23 1341       Pre Biometrics   Height 5\' 10"  (1.778 m)    Weight 69.9 kg    Waist Circumference 35 inches    Hip Circumference 36 inches    Waist to Hip Ratio 0.97 %    BMI (Calculated) 22.11    Triceps Skinfold 5 mm    % Body Fat 17.9 %    Grip Strength 20.6 kg    Flexibility 0 in    Single Leg Stand 0 seconds             Post Biometrics - 09/06/23 1130        Post  Biometrics   Height 5\' 10"  (1.778 m)    Weight 74.7 kg    Waist Circumference 34 inches    Hip Circumference 35 inches    Waist to Hip Ratio 0.97 %    BMI (Calculated) 23.63    Grip Strength 25.2 kg    Single Leg Stand 7 seconds

## 2023-09-18 NOTE — Progress Notes (Signed)
Pulmonary Individual Treatment Plan  Patient Details  Name: Caleb Perkins MRN: [id number] Date of Birth: May 20, 1976 Referring Provider:   Flowsheet Row CARDIAC REHAB PHASE II ORIENTATION from 06/28/2023 in [facility] CARDIAC REHABILITATION  Referring Provider Dr. Dorris Fetch       Initial Encounter Date:  Flowsheet Row CARDIAC REHAB PHASE II ORIENTATION from 06/28/2023 in [place] CARDIAC REHABILITATION  Date 06/28/23       Visit Diagnosis: S/P CABG x 5  NSTEMI (non-ST elevated myocardial infarction) (HCC)  Patient's Home Medications on Admission:   Current Outpatient Medications:    albuterol (VENTOLIN HFA) 108 (90 Base) MCG/ACT inhaler, Inhale 2 puffs into the lungs every 6 (six) hours as needed for wheezing or shortness of breath., Disp: 18 g, Rfl: 2   apixaban (ELIQUIS) 5 MG TABS tablet, Take 1 tablet (5 mg total) by mouth 2 (two) times daily. (Patient taking differently: Take 5 mg by mouth 2 (two) times daily. Take 1 tablet (5 mg) by mouth 2 (two) times daily. Total of 10 mg daily), Disp: 60 tablet, Rfl: 11   aspirin EC 81 MG tablet, Take 1 tablet (81 mg total) by mouth daily. Swallow whole., Disp: 30 tablet, Rfl: 11   atorvastatin (LIPITOR) 80 MG tablet, Take 1 tablet (80 mg total) by mouth daily., Disp: 30 tablet, Rfl: 6   carvedilol (COREG) 12.5 MG tablet, Take 1 tablet (12.5 mg total) by mouth 2 (two) times daily with a meal., Disp: 60 tablet, Rfl: 6   dapagliflozin propanediol (FARXIGA) 10 MG TABS tablet, Take 1 tablet (10 mg total) by mouth daily., Disp: 30 tablet, Rfl: 11   Fluticasone-Salmeterol (ADVAIR DISKUS) 250-50 MCG/DOSE AEPB, Inhale 1 puff into the lungs 2 (two) times daily. (Patient taking differently: Inhale 1 puff into the lungs as needed.), Disp: 60 each, Rfl: 2   furosemide (LASIX) 40 MG tablet, Take 1 tablet (40 mg total) by mouth daily as needed for edema (Weight gain of 3lbs or more in 24 hours or 5lbs or more in 48 hours)., Disp: 30 tablet, Rfl: 11    isosorbide mononitrate (IMDUR) 30 MG 24 hr tablet, Take 1 tablet (30 mg total) by mouth daily., Disp: 30 tablet, Rfl: 11   potassium chloride SA (KLOR-CON M) 20 MEQ tablet, Take 1 tablet (20 mEq total) by mouth daily as needed (When you take Lasix)., Disp: 30 tablet, Rfl: 5   sacubitril-valsartan (ENTRESTO) 49-51 MG, Take 1 tablet by mouth 2 (two) times daily., Disp: 60 tablet, Rfl: 11   spironolactone (ALDACTONE) 25 MG tablet, Take 0.5 tablets (12.5 mg total) by mouth daily., Disp: 15 tablet, Rfl: 11  Past Medical History: Past Medical History:  Diagnosis Date   Blood transfusion without reported diagnosis    Hypertension     Tobacco Use: Social History   Tobacco Use  Smoking Status Former   Current packs/day: 0.00   Types: Cigarettes   Quit date: 04/22/2023   Years since quitting: 0.4  Smokeless Tobacco Never    Labs: Review Flowsheet       Latest Ref Rng & Units 04/20/2023 04/22/2023 04/25/2023 08/06/2023  Labs for ITP Cardiac and Pulmonary Rehab  Cholestrol 0 - 200 mg/dL 409  - - 811   LDL (calc) 0 - 99 mg/dL 914  - - 72   HDL-C >78 mg/dL 35  - - 38   Trlycerides <150 mg/dL 295  - - 77   Hemoglobin A1c 4.8 - 5.6 % 5.2  - - -  PH, Arterial  7.35 - 7.45 - 7.369  7.280  7.327  7.337  7.297  7.304  7.307  -  PCO2 arterial 32 - 48 mmHg - 40.6  42.2  38.9  37.2  40.9  39.2  40.5  -  Bicarbonate 20.0 - 28.0 mmol/L - 23.4  23.9  20.0  20.5  20.1  20.0  22.1  19.4  20.2  -  TCO2 22 - 32 mmol/L - 25  25  21  22  21  21  21  23  24  23  21  23  23  21   -  Acid-base deficit 0.0 - 2.0 mmol/L - 2.0  2.0  7.0  5.0  5.0  6.0  4.0  6.0  6.0  -  O2 Saturation % - 99  75  97  96  91  99  83  100  100  -    Details       Multiple values from one day are sorted in reverse-chronological order         Capillary Blood Glucose: Lab Results  Component Value Date   GLUCAP 101 (H) 04/28/2023   GLUCAP 98 04/28/2023   GLUCAP 145 (H) 04/28/2023   GLUCAP 161 (H) 04/27/2023   GLUCAP 119 (H)  04/27/2023     Pulmonary Assessment Scores:  UCSD: Self-administered rating of dyspnea associated with activities of daily living (ADLs) 6-point scale (0 = "not at all" to 5 = "maximal or unable to do because of breathlessness")  Scoring Scores range from 0 to 120.  Minimally important difference is 5 units  CAT: CAT can identify the health impairment of COPD patients and is better correlated with disease progression.  CAT has a scoring range of zero to 40. The CAT score is classified into four groups of low (less than 10), medium (10 - 20), high (21-30) and very high (31-40) based on the impact level of disease on health status. A CAT score over 10 suggests significant symptoms.  A worsening CAT score could be explained by an exacerbation, poor medication adherence, poor inhaler technique, or progression of COPD or comorbid conditions.  CAT MCID is 2 points  mMRC: mMRC (Modified Medical Research Council) Dyspnea Scale is used to assess the degree of baseline functional disability in patients of respiratory disease due to dyspnea. No minimal important difference is established. A decrease in score of 1 point or greater is considered a positive change.   Pulmonary Function Assessment:   Exercise Target Goals: Exercise Program Goal: Individual exercise prescription set using results from initial 6 min walk test and THRR while considering  patient's activity barriers and safety.   Exercise Prescription Goal: Initial exercise prescription builds to 30-45 minutes a day of aerobic activity, 2-3 days per week.  Home exercise guidelines will be given to patient during program as part of exercise prescription that the participant will acknowledge.  Activity Barriers & Risk Stratification:  Activity Barriers & Cardiac Risk Stratification - 06/28/23 1232       Activity Barriers & Cardiac Risk Stratification   Activity Barriers Shortness of Breath;Back Problems;Incisional Pain    Cardiac Risk  Stratification High             6 Minute Walk:  6 Minute Walk     Row Name 06/28/23 1338 09/06/23 1129       6 Minute Walk   Phase Initial Discharge    Distance 1350 feet 1430 feet  initial test rescored distance  1290 ft    Distance % Change -- 10.8 %    Distance Feet Change -- 140 ft    Walk Time 6 minutes 6 minutes    # of Rest Breaks 0 0    MPH 2.55 2.71    METS 4.39 4.92    RPE 12 14    VO2 Peak 15.37 17.22    Symptoms No No    Resting HR 69 bpm 76 bpm    Resting BP 102/76 124/62    Resting Oxygen Saturation  97 % --    Exercise Oxygen Saturation  during 6 min walk 97 % --    Max Ex. HR 66 bpm 117 bpm    Max Ex. BP 116/70 128/62    2 Minute Post BP 108/70 --             Oxygen Initial Assessment:   Oxygen Re-Evaluation:   Oxygen Discharge (Final Oxygen Re-Evaluation):   Initial Exercise Prescription:  Initial Exercise Prescription - 06/28/23 1300       Date of Initial Exercise RX and Referring Provider   Date 06/28/23    Referring Provider Dr. Dorris Fetch    Expected Discharge Date 09/18/23      Treadmill   MPH 1.4    Grade 0    Minutes 17      NuStep   Level 1    SPM 60    Minutes 22      Prescription Details   Frequency (times per week) 3    Duration Progress to 30 minutes of continuous aerobic without signs/symptoms of physical distress      Intensity   THRR 40-80% of Max Heartrate 70-141    Ratings of Perceived Exertion 11-13      Resistance Training   Training Prescription Yes    Weight 4    Reps 10-15             Perform Capillary Blood Glucose checks as needed.  Exercise Prescription Changes:   Exercise Prescription Changes     Row Name 07/01/23 1200 07/12/23 1100 07/15/23 1500 07/29/23 1300 08/12/23 1300     Response to Exercise   Blood Pressure (Admit) 108/70 -- 104/74 104/64 92/60   Blood Pressure (Exercise) 126/68 -- 110/74 116/68 --   Blood Pressure (Exit) 108/70 -- 102/60 100/56 98/60   Heart Rate  (Admit) 59 bpm -- 70 bpm 74 bpm 76 bpm   Heart Rate (Exercise) 75 bpm -- 76 bpm 90 bpm 92 bpm   Heart Rate (Exit) 67 bpm -- 61 bpm 83 bpm 75 bpm   Rating of Perceived Exertion (Exercise) 12 -- 12 12 12    Duration Continue with 30 min of aerobic exercise without signs/symptoms of physical distress. -- Continue with 30 min of aerobic exercise without signs/symptoms of physical distress. Continue with 30 min of aerobic exercise without signs/symptoms of physical distress. Continue with 30 min of aerobic exercise without signs/symptoms of physical distress.   Intensity THRR unchanged -- THRR unchanged THRR unchanged THRR unchanged     Progression   Progression Continue to progress workloads to maintain intensity without signs/symptoms of physical distress. -- Continue to progress workloads to maintain intensity without signs/symptoms of physical distress. Continue to progress workloads to maintain intensity without signs/symptoms of physical distress. Continue to progress workloads to maintain intensity without signs/symptoms of physical distress.     Resistance Training   Training Prescription Yes -- Yes Yes Yes   Weight 4 -- 4 4 4  Reps 10-15 -- 10-15 10-15 10-15   Time 10 Minutes -- -- -- --     Treadmill   MPH 1.4 -- 1.5 2 3    Grade 0 -- 0.5 1.5 3   Minutes 17 -- 15 15 15    METs 2.07 -- 2.25 2.95 4.54     NuStep   Level 1 -- 2 4 4    SPM 54 -- 73 89 85   Minutes 22 -- 15 15 15    METs 1.73 -- 2 2.95 2.7     Home Exercise Plan   Plans to continue exercise at -- Home (comment)  walking, weights -- -- --   Frequency -- Add 2 additional days to program exercise sessions. -- -- --   Initial Home Exercises Provided -- 07/12/23 -- -- --     Oxygen   Maintain Oxygen Saturation -- -- 88% or higher 88% or higher 88% or higher    Row Name 09/09/23 1300             Response to Exercise   Blood Pressure (Admit) 118/72       Blood Pressure (Exit) 102/62       Heart Rate (Admit) 88 bpm        Heart Rate (Exercise) 110 bpm       Heart Rate (Exit) 84 bpm       Rating of Perceived Exertion (Exercise) 15       Duration Continue with 30 min of aerobic exercise without signs/symptoms of physical distress.       Intensity THRR unchanged         Progression   Progression Continue to progress workloads to maintain intensity without signs/symptoms of physical distress.         Resistance Training   Training Prescription Yes       Weight 5       Reps 10-15         Treadmill   MPH 3.5       Grade 1       Minutes 15       METs 5.61         NuStep   Level 6       SPM 67       Minutes 15       METs 3         Home Exercise Plan   Plans to continue exercise at Home (comment)       Frequency Add 2 additional days to program exercise sessions.         Oxygen   Maintain Oxygen Saturation 88% or higher                Exercise Comments:   Exercise Goals and Review:   Exercise Goals     Row Name 06/28/23 1340 07/15/23 1553           Exercise Goals   Increase Physical Activity Yes Yes      Intervention Provide advice, education, support and counseling about physical activity/exercise needs.;Develop an individualized exercise prescription for aerobic and resistive training based on initial evaluation findings, risk stratification, comorbidities and participant's personal goals. Provide advice, education, support and counseling about physical activity/exercise needs.;Develop an individualized exercise prescription for aerobic and resistive training based on initial evaluation findings, risk stratification, comorbidities and participant's personal goals.      Expected Outcomes Short Term: Attend rehab on a regular basis to increase amount of physical activity.;Long Term: Add in  home exercise to make exercise part of routine and to increase amount of physical activity.;Long Term: Exercising regularly at least 3-5 days a week. Short Term: Attend rehab on a regular basis to  increase amount of physical activity.;Long Term: Add in home exercise to make exercise part of routine and to increase amount of physical activity.;Long Term: Exercising regularly at least 3-5 days a week.      Increase Strength and Stamina Yes Yes      Intervention Provide advice, education, support and counseling about physical activity/exercise needs.;Develop an individualized exercise prescription for aerobic and resistive training based on initial evaluation findings, risk stratification, comorbidities and participant's personal goals. Provide advice, education, support and counseling about physical activity/exercise needs.;Develop an individualized exercise prescription for aerobic and resistive training based on initial evaluation findings, risk stratification, comorbidities and participant's personal goals.      Expected Outcomes Short Term: Increase workloads from initial exercise prescription for resistance, speed, and METs.;Short Term: Perform resistance training exercises routinely during rehab and add in resistance training at home;Long Term: Improve cardiorespiratory fitness, muscular endurance and strength as measured by increased METs and functional capacity ( ) Short Term: Increase workloads from initial exercise prescription for resistance, speed, and METs.;Short Term: Perform resistance training exercises routinely during rehab and add in resistance training at home;Long Term: Improve cardiorespiratory fitness, muscular endurance and strength as measured by increased METs and functional capacity ( )      Able to understand and use rate of perceived exertion (RPE) scale Yes Yes      Intervention Provide education and explanation on how to use RPE scale Provide education and explanation on how to use RPE scale      Expected Outcomes Short Term: Able to use RPE daily in rehab to express subjective intensity level;Long Term:  Able to use RPE to guide intensity level when exercising  independently Short Term: Able to use RPE daily in rehab to express subjective intensity level;Long Term:  Able to use RPE to guide intensity level when exercising independently      Knowledge and understanding of Target Heart Rate Range (THRR) Yes Yes      Intervention Provide education and explanation of THRR including how the numbers were predicted and where they are located for reference Provide education and explanation of THRR including how the numbers were predicted and where they are located for reference      Expected Outcomes Short Term: Able to state/look up THRR;Long Term: Able to use THRR to govern intensity when exercising independently;Short Term: Able to use daily as guideline for intensity in rehab Short Term: Able to state/look up THRR;Long Term: Able to use THRR to govern intensity when exercising independently;Short Term: Able to use daily as guideline for intensity in rehab      Able to check pulse independently Yes Yes      Intervention Provide education and demonstration on how to check pulse in carotid and radial arteries.;Review the importance of being able to check your own pulse for safety during independent exercise Provide education and demonstration on how to check pulse in carotid and radial arteries.;Review the importance of being able to check your own pulse for safety during independent exercise      Expected Outcomes Short Term: Able to explain why pulse checking is important during independent exercise;Long Term: Able to check pulse independently and accurately Short Term: Able to explain why pulse checking is important during independent exercise;Long Term: Able to check pulse independently and accurately  Understanding of Exercise Prescription Yes Yes      Intervention Provide education, explanation, and written materials on patient's individual exercise prescription Provide education, explanation, and written materials on patient's individual exercise prescription       Expected Outcomes Short Term: Able to explain program exercise prescription;Long Term: Able to explain home exercise prescription to exercise independently Short Term: Able to explain program exercise prescription;Long Term: Able to explain home exercise prescription to exercise independently               Exercise Goals Re-Evaluation :  Exercise Goals Re-Evaluation     Row Name 07/12/23 1134 07/15/23 1553 07/26/23 1131 07/29/23 1342 07/29/23 1353     Exercise Goal Re-Evaluation   Exercise Goals Review Increase Physical Activity;Increase Strength and Stamina;Able to understand and use rate of perceived exertion (RPE) scale;Able to understand and use Dyspnea scale;Knowledge and understanding of Target Heart Rate Range (THRR);Able to check pulse independently;Understanding of Exercise Prescription Increase Physical Activity;Increase Strength and Stamina;Able to understand and use rate of perceived exertion (RPE) scale;Able to check pulse independently;Knowledge and understanding of Target Heart Rate Range (THRR);Understanding of Exercise Prescription Increase Physical Activity;Increase Strength and Stamina;Understanding of Exercise Prescription -- --   Comments Reviewed home exercise with pt today.  Pt plans to walk and use weights at home for exercise.  Reviewed THR, pulse, RPE, sign and symptoms, pulse oximetery and when to call 911 or MD.  Also discussed weather considerations and indoor options.  Pt voiced understanding. Pt has completed 8 sessions of cardiac rehab. He is motivated during class to exercise and plans to exercise at home on his days out of class. He is currently exerciisng at 2.25 METs on the treadmill. Will continue to monitor and progress as able, Patient is doing well in the program. He continues to be motivated during class. He is walking at home for exercise. He walks in his neighbor using a pokiemon go. He does this on tlhe days he does not come to CR and is able to increase  his distance. Pating is increasing his levels on both hte treadmill and the stepper. HE continues to be motivated anf enjpys coming to rehab Caleb Perkins is increasing his workloads on both the treadmill and stepper. He is enjoying coming to rehab.   Expected Outcomes Short: Start to add in exerise at home on off days Long; Continue to exercise independently Through exercise at rehab and home, patient will achieve their goals. Short term: Patient will continue to exercise by walking at home. Long term: Patient will continue to work toward increasing his strength and stamina and improve his SOB. -- --    Row Name 08/16/23 1359 08/19/23 1114           Exercise Goal Re-Evaluation   Exercise Goals Review Increase Physical Activity;Understanding of Exercise Prescription;Increase Strength and Stamina Increase Physical Activity;Increase Strength and Stamina;Understanding of Exercise Prescription      Comments Caleb Perkins has increased his speed on the treadmill with a speed of 3.0 and grade of 3.0. Will continue to monitor and progress as able, Caleb Perkins is doing well in rehab.  He is walking at home on his off days for 45 min each way for a total 90 min.  He has noted that he has been getting dizzy during the walk.  We talked about making sure he is getting enough water.  He has noticed that his stamina is increasing and he is feeling better overall.      Expected Outcomes  Short term: increase level on the NuStep to level 5   long term: continue to attend cardiac rehab sessions Short: continue to walk and drink more Long: Conitnue to improve stamina               Discharge Exercise Prescription (Final Exercise Prescription Changes):  Exercise Prescription Changes - 09/09/23 1300       Response to Exercise   Blood Pressure (Admit) 118/72    Blood Pressure (Exit) 102/62    Heart Rate (Admit) 88 bpm    Heart Rate (Exercise) 110 bpm    Heart Rate (Exit) 84 bpm    Rating of Perceived Exertion (Exercise) 15     Duration Continue with 30 min of aerobic exercise without signs/symptoms of physical distress.    Intensity THRR unchanged      Progression   Progression Continue to progress workloads to maintain intensity without signs/symptoms of physical distress.      Resistance Training   Training Prescription Yes    Weight 5    Reps 10-15      Treadmill   MPH 3.5    Grade 1    Minutes 15    METs 5.61      NuStep   Level 6    SPM 67    Minutes 15    METs 3      Home Exercise Plan   Plans to continue exercise at Home (comment)    Frequency Add 2 additional days to program exercise sessions.      Oxygen   Maintain Oxygen Saturation 88% or higher             Nutrition:  Target Goals: Understanding of nutrition guidelines, daily intake of sodium 1500mg , cholesterol 200mg , calories 30% from fat and 7% or less from saturated fats, daily to have 5 or more servings of fruits and vegetables.  Biometrics:  Pre Biometrics - 06/28/23 1341       Pre Biometrics   Height 5\' 10"  (1.778 m)    Weight 69.9 kg    Waist Circumference 35 inches    Hip Circumference 36 inches    Waist to Hip Ratio 0.97 %    BMI (Calculated) 22.11    Triceps Skinfold 5 mm    % Body Fat 17.9 %    Grip Strength 20.6 kg    Flexibility 0 in    Single Leg Stand 0 seconds             Post Biometrics - 09/06/23 1130        Post  Biometrics   Height 5\' 10"  (1.778 m)    Weight 74.7 kg    Waist Circumference 34 inches    Hip Circumference 35 inches    Waist to Hip Ratio 0.97 %    BMI (Calculated) 23.63    Grip Strength 25.2 kg    Single Leg Stand 7 seconds             Nutrition Therapy Plan and Nutrition Goals:  Nutrition Therapy & Goals - 07/08/23 1035       Nutrition Therapy   RD appointment deferred Yes      Personal Nutrition Goals   Comments We provide educational sessions on heart healthy nutrition with handouts.      Intervention Plan   Intervention Nutrition handout(s) given to  patient.    Expected Outcomes Short Term Goal: Understand basic principles of dietary content, such as calories, fat, sodium, cholesterol and nutrients.  Nutrition Assessments:  Nutrition Assessments - 06/28/23 1320       MEDFICTS Scores   Pre Score 30            MEDIFICTS Score Key: >=70 Need to make dietary changes  40-70 Heart Healthy Diet <= 40 Therapeutic Level Cholesterol Diet   Picture Your Plate Scores: <16 Unhealthy dietary pattern with much room for improvement. 41-50 Dietary pattern unlikely to meet recommendations for good health and room for improvement. 51-60 More healthful dietary pattern, with some room for improvement.  >60 Healthy dietary pattern, although there may be some specific behaviors that could be improved.    Nutrition Goals Re-Evaluation:  Nutrition Goals Re-Evaluation     Row Name 07/26/23 1119 08/19/23 1123           Goals   Nutrition Goal Patient wants to decrease the amount of ice cream he eats and eat more healthy meat. Short Term: cut back on ice cream and increase healthy proteins. Long term: Continue eating healthy and adquate calorie intake.      Comment Patinet says he is eating a lot of salads, fruits especially blueberriers and vegetables. He does not eat any fried foods or a lot of meat. He says he knows he is eating too much ice cream. He is working on cutting back. We will continue to monitor. Caleb Perkins is doing well in rehab.  He admits to needing to eat better.  He has still been eating his ice cream and cheeseburgers.  His taste buds are back and he is enjoying his eating again.  We talked about picking healthier options and getting the ones with less sugar.  He is still eating salads to get in his vegetables in.      Expected Outcome Short Term: cut back on ice cream and increase healthy proteins. Long term: Continue eating healthy and adquate calorie intake. Short: Cut back on ice cream Long: Try to aim for healthier  versions of favortie snacks.               Nutrition Goals Discharge (Final Nutrition Goals Re-Evaluation):  Nutrition Goals Re-Evaluation - 08/19/23 1123       Goals   Nutrition Goal Short Term: cut back on ice cream and increase healthy proteins. Long term: Continue eating healthy and adquate calorie intake.    Comment Caleb Perkins is doing well in rehab.  He admits to needing to eat better.  He has still been eating his ice cream and cheeseburgers.  His taste buds are back and he is enjoying his eating again.  We talked about picking healthier options and getting the ones with less sugar.  He is still eating salads to get in his vegetables in.    Expected Outcome Short: Cut back on ice cream Long: Try to aim for healthier versions of favortie snacks.             Psychosocial: Target Goals: Acknowledge presence or absence of significant depression and/or stress, maximize coping skills, provide positive support system. Participant is able to verbalize types and ability to use techniques and skills needed for reducing stress and depression.  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 He is not able to pay  child support and his son's mother does not allow him to have a relationship with his son and this along with no income is very stressful.      Family Dynamics   Good Support System? Yes      Barriers   Psychosocial barriers to participate in program There are no identifiable barriers or psychosocial needs.      Screening Interventions   Interventions Encouraged to exercise;To provide support and resources with identified psychosocial needs;Provide feedback about the scores to participant    Expected  Outcomes Long Term goal: The participant improves quality of Life and PHQ9 Scores as seen by post scores and/or verbalization of changes;Short Term goal: Utilizing psychosocial counselor, staff and physician to assist with identification of specific Stressors or current issues interfering with healing process. Setting desired goal for each stressor or current issue identified.;Long Term Goal: Stressors or current issues are controlled or eliminated.;Short Term goal: Identification and review with participant of any Quality of Life or Depression concerns found by scoring the questionnaire.             Quality of Life Scores:  Quality of Life - 06/28/23 1341       Quality of Life   Select Quality of Life      Quality of Life Scores   Health/Function Pre 26 %    Socioeconomic Pre 30 %    Psych/Spiritual Pre 30 %    Family Pre 30 %    GLOBAL Pre 28.29 %            Scores of 19 and below usually indicate a poorer quality of life in these areas.  A difference of  2-3 points is a clinically meaningful difference.  A difference of 2-3 points in the total score of the Quality of Life Index has been associated with significant improvement in overall quality of life, self-image, physical symptoms, and general health in studies assessing change in quality of life.   PHQ-9: Review Flowsheet       08/19/2023 06/28/2023  Depression screen PHQ 2/9  Decreased Interest 0 0  Down, Depressed, Hopeless 0 3  PHQ - 2 Score 0 3  Altered sleeping 2 3  Tired, decreased energy 0 3  Change in appetite 0 3  Feeling bad or failure about yourself  0 3  Trouble concentrating 0 3  Moving slowly or fidgety/restless 0 0  Suicidal thoughts 0 0  PHQ-9 Score 2 18  Difficult doing work/chores Somewhat difficult Extremely dIfficult    Details           Interpretation of Total Score  Total Score Depression Severity:  1-4 = Minimal depression, 5-9 = Mild depression, 10-14 = Moderate depression, 15-19 =  Moderately severe depression, 20-27 = Severe depression   Psychosocial Evaluation and Intervention:  Psychosocial Evaluation - 06/28/23 1337       Psychosocial Evaluation & Interventions   Interventions Stress management education;Relaxation education;Encouraged to exercise with the program and follow exercise prescription    Comments Patient has no psychosocial barriers to participate in CR identified at his orientation visit. His PHQ-9 score was 18. He says he has had depression and anxiety most all of his life. He says he has never tried any treatment and does not want to at this time. He feels like he is able to manage and cope on his own. 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 He is interested in  vocational rehab. He lives with his mother. He did state he has been homeless a few years ago but his mother moved inorder to allow him to live with her. He has a 25 year old son. He says the son's mother moves around a lot and does not allow him to have a relationship with him and this "bothers" him a lot along with his lack of financial income. He says his mother does support him with some of his bills, food, and transportation. He says he looking forward to participating in the program hoping to get stronger and be able to get employment.    Expected Outcomes Patient will continue to have no psychosocial barriers identified.    Continue Psychosocial Services  No Follow up required             Psychosocial Re-Evaluation:  Psychosocial Re-Evaluation     Row Name 07/08/23 1036 07/26/23 1107 08/19/23 1116         Psychosocial Re-Evaluation   Current issues with History of Depression;Current Anxiety/Panic History of Depression;Current Anxiety/Panic History of Depression;Current Anxiety/Panic     Comments Patient is new to the program. He has completed 4 sessions. He continues to have no psychosocial barriers identified. He continues to feels his  depression and anxiety are managed without treatment. He seems to enjoy the sessions and demonstrates an interest in improving his health. Patient doing well in the program. He states his SOB is improving and he is able to walk to the end of his block without fatigue or geting SOB. He feels he is geting stronger and his stamina is improving. His mother has been his transportation source and support and she is currently out of town caring for a family member. His brother is bringing him to CR and "takig care of him" he feels this has been a stressor for his brother. His mother is coming home this weekend and he is glad. He has ongoing stress over not being able to have a relationship with his son who lives in [place] with his mother. He says she has blocked him from all communication. He feels he mananges his stress as best he can. He is working with vocational rehab to hopefully find employment. He says he still worries because he has to pay 5 years back pay for child support. His surgeon has not released him to work. He has appointment with him in September and hopes to be released. We will continue to monitor. Caleb Perkins is doing well in rehab.  He is feeling good overall and feels that his mental health is improving.  He has not been sleeping well and has constant trips to bathroom and then has difficulty getting back to sleep which does mess with his mentality.  Overall, his PHQ has gone from 18  down to 2!!  He says it's from moving more and feeling better overall. He would like to try melatonin to help with sleep.  He was cautioned against using nightly and try with small dose. We also talked about no blue light screens for 30 min before bed and no TV on at bedtimes.     Expected Outcomes Patient will continue to have no psychosocial barriers identified. Goals: Short Term: Continue to work with vocational rehab. 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 Short: Try  melatonin and getting away from screens prior to bed time Long: Conitnue to exercise for mental boost.     Interventions Stress  management education;Relaxation education;Encouraged to attend Cardiac Rehabilitation for the exercise Stress management education;Encouraged to attend Cardiac Rehabilitation for the exercise;Relaxation education Stress management education;Encouraged to attend Cardiac Rehabilitation for the exercise     Continue Psychosocial Services  No Follow up required No Follow up required Follow up required by staff              Psychosocial Discharge (Final Psychosocial Re-Evaluation):  Psychosocial Re-Evaluation - 08/19/23 1116       Psychosocial Re-Evaluation   Current issues with History of Depression;Current Anxiety/Panic    Comments Caleb Perkins is doing well in rehab.  He is feeling good overall and feels that his mental health is improving.  He has not been sleeping well and has constant trips to bathroom and then has difficulty getting back to sleep which does mess with his mentality.  Overall, his PHQ has gone from 18  down to 2!!  He says it's from moving more and feeling better overall. He would like to try melatonin to help with sleep.  He was cautioned against using nightly and try with small dose. We also talked about no blue light screens for 30 min before bed and no TV on at bedtimes.    Expected Outcomes Short: Try melatonin and getting away from screens prior to bed time Long: Conitnue to exercise for mental boost.    Interventions Stress management education;Encouraged to attend Cardiac Rehabilitation for the exercise    Continue Psychosocial Services  Follow up required by staff              Education: Education Goals: Education classes will be provided on a weekly basis, covering required topics. Participant will state understanding/return demonstration of topics presented.  Learning Barriers/Preferences:  Learning Barriers/Preferences - 06/28/23 1322        Learning Barriers/Preferences   Learning Barriers None    Learning Preferences Written Material;Audio;Skilled Demonstration             Education Topics: How Lungs Work and Diseases: - Discuss the anatomy of the lungs and diseases that can affect the lungs, such as COPD.   Exercise: -Discuss the importance of exercise, FITT principles of exercise, normal and abnormal responses to exercise, and how to exercise safely.   Environmental Irritants: -Discuss types of environmental irritants and how to limit exposure to environmental irritants.   Meds/Inhalers and oxygen: - Discuss respiratory medications, definition of an inhaler and oxygen, and the proper way to use an inhaler and oxygen.   Energy Saving Techniques: - Discuss methods to conserve energy and decrease shortness of breath when performing activities of daily living.    Bronchial Hygiene / Breathing Techniques: - Discuss breathing mechanics, pursed-lip breathing technique,  proper posture, effective ways to clear airways, and other functional breathing techniques   Cleaning Equipment: - Provides group verbal and written instruction about the health risks of elevated stress, cause of high stress, and healthy ways to reduce stress.   Nutrition I: Fats: - Discuss the types of cholesterol, what cholesterol does to the body, and how cholesterol levels can be controlled.   Nutrition II: Labels: -Discuss the different components of food labels and how to read food labels.   Respiratory Infections: - Discuss the signs and symptoms of respiratory infections, ways to prevent respiratory infections, and the importance of seeking medical treatment when having a respiratory infection.   Stress I: Signs and Symptoms: - Discuss the causes of stress, how stress may lead to anxiety and depression, and ways to  limit stress. Flowsheet Row CARDIAC REHAB PHASE II EXERCISE from 09/18/2023 in [place] CARDIAC  REHABILITATION  Date 08/21/23  Educator [facility]  Instruction Review Code 2- Demonstrated Understanding       Stress II: Relaxation: -Discuss relaxation techniques to limit stress. Flowsheet Row CARDIAC REHAB PHASE II EXERCISE from 09/18/2023 in [place] CARDIAC REHABILITATION  Date 08/21/23  Educator [facility]  Instruction Review Code 2- Demonstrated Understanding       Oxygen for Home/Travel: - Discuss how to prepare for travel when on oxygen and proper ways to transport and store oxygen to ensure safety.   Knowledge Questionnaire Score:  Knowledge Questionnaire Score - 06/28/23 1320       Knowledge Questionnaire Score   Pre Score 22/24             Core Components/Risk Factors/Patient Goals at Admission:  Personal Goals and Risk Factors at Admission - 06/28/23 1323       Core Components/Risk Factors/Patient Goals on Admission    Weight Management Weight Maintenance    Improve shortness of breath with ADL's Yes    Intervention Provide education, individualized exercise plan and daily activity instruction to help decrease symptoms of SOB with activities of daily living.    Expected Outcomes Short Term: Improve cardiorespiratory fitness to achieve a reduction of symptoms when performing ADLs;Long Term: Be able to perform more ADLs without symptoms or delay the onset of symptoms    Hypertension Yes    Intervention Provide education on lifestyle modifcations including regular physical activity/exercise, weight management, moderate sodium restriction and increased consumption of fresh fruit, vegetables, and low fat dairy, alcohol moderation, and smoking cessation.;Monitor prescription use compliance.    Expected Outcomes Short Term: Continued assessment and intervention until BP is < 140/34mm HG in hypertensive participants. < 130/90mm HG in hypertensive participants with diabetes, heart failure or chronic kidney disease.;Long Term: Maintenance of blood pressure at goal levels.     Lipids Yes    Intervention Provide education and support for participant on nutrition & aerobic/resistive exercise along with prescribed medications to achieve LDL 70mg , HDL >40mg .    Expected Outcomes Short Term: Participant states understanding of desired cholesterol values and is compliant with medications prescribed. Participant is following exercise prescription and nutrition guidelines.;Long Term: Cholesterol controlled with medications as prescribed, with individualized exercise RX and with personalized nutrition plan. Value goals: LDL < 70mg , HDL > 40 mg.    Stress Yes    Intervention Offer individual and/or small group education and counseling on adjustment to heart disease, stress management and health-related lifestyle change. Teach and support self-help strategies.;Refer participants experiencing significant psychosocial distress to appropriate mental health specialists for further evaluation and treatment. When possible, include family members and significant others in education/counseling sessions.    Expected Outcomes Short Term: Participant demonstrates changes in health-related behavior, relaxation and other stress management skills, ability to obtain effective social support, and compliance with psychotropic medications if prescribed.;Long Term: Emotional wellbeing is indicated by absence of clinically significant psychosocial distress or social isolation.    Personal Goal Other Yes    Personal Goal Patient wants to improve his strength and stamina; Walk longer distance without getting SOB; and be able to go back to work.    Intervention Patient will attend CR 3 days/week with exercise and education.    Expected Outcomes Patient will complete the program meeting both personal and program goals.             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 He has multiple risk factors for CAD and is participating in the program for risk modificaiton. He has completed 4 sessions. His current weight is 155.0 lbs losing 0.7 bls from his intial visit. He is doing well in the program. His blood pressure is at goal. 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 We will continue to monitor his progress as he works towards meeting these goals. Patient is doing well in the program with progressions and consistent attendance. His blood pressure continues to be well controlled. He does montior his blood pressure at home and it is always below 110 systolic. He says some mornings it is in the 90's and below 90 a few occaions. He denies any chronic dizziness. He says he get dizzy occasionally when he gets up too quickly I encouaged him to continue to monitor his blood pressue and takes his readings in when he sees Caleb Grove, NP and tell her about the low readings and positional dizziness. He is maintaining his weight. His current weight 152 lbs. He has lost 5 lbs from his initial weight. He is compliant with his medications. He has an upcoming ECHO and an appointmet to see E. Philis Nettle 8/15. No recent lipid panel on file. We will continue to monitor. Caleb Perkins is doing well in reheb.  His weight has been going up since he has been eating again.  His weight is up about 20 lb since starting and he feels he is gaining too fast from eating poorly.  We talked about how his grafts could occlude too if he does not adapt healthier eating habits.  His pressures are doing well for most part, however, recently they have been too low in  the 90s for him.  He has noted some dizzy spells but did not attribute it to anything other than maybe dehydrated so he was encouraged to check his pressure when he feels dizzy and to report numbers to doctor to help with BP issues.     Expected Outcomes Patient will complete the program meeting both personal and program goals. Short Term: Long term Goals: blood pressure will continue to be at goal and weight maintained. Short: Keep close eye on pressures and dizzy spells Long: Conitnue to work on weight loss again with improving diet.              Core Components/Risk Factors/Patient Goals at Discharge (Final Review):   Goals and Risk Factor Review - 08/19/23 1125       Core Components/Risk Factors/Patient Goals Review   Personal Goals Review Weight Management/Obesity;Lipids;Hypertension    Review Caleb Perkins is doing well in reheb.  His weight has been going up since he has been eating again.  His weight is up about 20 lb since starting and he feels he is gaining too fast from eating poorly.  We talked about how his grafts could occlude too if he does not adapt healthier eating habits.  His pressures are doing well for most part, however, recently they have been too low in the 90s for  him.  He has noted some dizzy spells but did not attribute it to anything other than maybe dehydrated so he was encouraged to check his pressure when he feels dizzy and to report numbers to doctor to help with BP issues.    Expected Outcomes Short: Keep close eye on pressures and dizzy spells Long: Conitnue to work on weight loss again with improving diet.             ITP Comments:  ITP Comments     Row Name 07/05/23 1259 07/17/23 1304 08/14/23 0815 08/30/23 1115 09/11/23 0817   ITP Comments Gave patient vocational rehab packet 07/03/23. He returned it and it was faxed to vocational rehab 07/05/23. 30 day review completed. ITP sent to Dr. Dina Rich, Medical Director of Cardiac Rehab. Continue with ITP unless  changes are made by physician. 30 day review completed. ITP sent to Dr. Dina Rich, Medical Director of Cardiac Rehab. Continue with ITP unless changes are made by physician. Caleb Perkins BP started out lower today 82/60; he was given 2 bottles of water and rechecked 94/64 and also rechecked buring exercise 96/60 30 day review completed. ITP sent to Dr. Dina Rich, Medical Director of Cardiac Rehab. Continue with ITP unless changes are made by physician.    Row Name 09/18/23 1046           ITP Comments Whitney graduated today from  rehab with 36 sessions completed.  Details of the patient's exercise prescription and what He needs to do in order to continue the prescription and progress were discussed with patient.  Patient was given a copy of prescription and goals.  Patient verbalized understanding. Caleb Perkins plans to continue to exercise by exercising at home.                Comments: Discharge ITP

## 2023-09-18 NOTE — Progress Notes (Deleted)
Daily Session Note  Patient Details  Name: Caleb Perkins MRN: [id number] Date of Birth: Mar 07, 1976 Referring Provider:   Flowsheet Row CARDIAC REHAB PHASE II ORIENTATION from 06/28/2023 in [facility] CARDIAC REHABILITATION  Referring Provider Dr. Dorris Fetch       Encounter Date: 09/18/2023  Check In:  Session Check In - 09/18/23 1043       Check-In   Supervising physician immediately available to respond to emergencies See telemetry face sheet for immediately available MD    Location [facility]    Staff Present Ross Ludwig, BS, Exercise Physiologist;Jessica Juanetta Gosling, MA, RCEP, CCRP, CCET;Hillary Troutman BSN, RN    Virtual Visit No    Medication changes reported     No    Fall or balance concerns reported    No    Tobacco Cessation No Change    Warm-up and Cool-down Performed on first and last piece of equipment    Resistance Training Performed Yes    VAD Patient? No    PAD/SET Patient? No      Pain Assessment   Currently in Pain? No/denies    Pain Score 0-No pain    Multiple Pain Sites No             Capillary Blood Glucose: No results found for this or any previous visit (from the past 24 hour(s)).    Social History   Tobacco Use  Smoking Status Former   Current packs/day: 0.00   Types: Cigarettes   Quit date: 04/22/2023   Years since quitting: 0.4  Smokeless Tobacco Never    Goals Met:  Independence with exercise equipment Exercise tolerated well No report of concerns or symptoms today Strength training completed today  Goals Unmet:  Not Applicable  Comments: Pt able to follow exercise prescription today without complaint.  Will continue to monitor for progression.    Dr. Dina Rich is Medical Director for [facility] Cardiac Rehab

## 2023-09-18 NOTE — Progress Notes (Signed)
Daily Session Note  Patient Details  Name: Caleb Perkins MRN: [id number] Date of Birth: 1976-02-08 Referring Provider:   Flowsheet Row CARDIAC REHAB PHASE II ORIENTATION from 06/28/2023 in [facility] CARDIAC REHABILITATION  Referring Provider Dr. Dorris Fetch       Encounter Date: 09/18/2023  Check In:  Session Check In - 09/18/23 1043       Check-In   Supervising physician immediately available to respond to emergencies See telemetry face sheet for immediately available MD    Location [facility]    Staff Present Ross Ludwig, BS, Exercise Physiologist;Jessica Juanetta Gosling, MA, RCEP, CCRP, CCET;Hillary Troutman BSN, RN    Virtual Visit No    Medication changes reported     No    Fall or balance concerns reported    No    Tobacco Cessation No Change    Warm-up and Cool-down Performed on first and last piece of equipment    Resistance Training Performed Yes    VAD Patient? No    PAD/SET Patient? No      Pain Assessment   Currently in Pain? No/denies    Pain Score 0-No pain    Multiple Pain Sites No             Capillary Blood Glucose: No results found for this or any previous visit (from the past 24 hour(s)).    Social History   Tobacco Use  Smoking Status Former   Current packs/day: 0.00   Types: Cigarettes   Quit date: 04/22/2023   Years since quitting: 0.4  Smokeless Tobacco Never    Goals Met:  Independence with exercise equipment Exercise tolerated well No report of concerns or symptoms today Strength training completed today  Goals Unmet:  Not Applicable  Comments:  Caleb Perkins graduated today from  rehab with 36 sessions completed.  Details of the patient's exercise prescription and what He needs to do in order to continue the prescription and progress were discussed with patient.  Patient was given a copy of prescription and goals.  Patient verbalized understanding. Caleb Perkins plans to continue to exercise by exercising at home.    Dr.  Dina Rich is Medical Director for [facility] Cardiac Rehab

## 2023-09-19 NOTE — Progress Notes (Signed)
Cardiac Individual Treatment Plan  Patient Details  Name: Caleb Perkins MRN: [id number] Date of Birth: [birth year] Referring Provider:   Flowsheet Row CARDIAC REHAB PHASE II ORIENTATION from 06/28/2023 in [facility] CARDIAC REHABILITATION  Referring Provider Dr. Dorris Fetch       Initial Encounter Date:  Flowsheet Row CARDIAC REHAB PHASE II ORIENTATION from 06/28/2023 in [place] CARDIAC REHABILITATION  Date 06/28/23       Visit Diagnosis: S/P CABG x 5  NSTEMI (non-ST elevated myocardial infarction) (HCC)  Patient's Home Medications on Admission:  Current Outpatient Medications:    albuterol (VENTOLIN HFA) 108 (90 Base) MCG/ACT inhaler, Inhale 2 puffs into the lungs every 6 (six) hours as needed for wheezing or shortness of breath., Disp: 18 g, Rfl: 2   apixaban (ELIQUIS) 5 MG TABS tablet, Take 1 tablet (5 mg total) by mouth 2 (two) times daily. (Patient taking differently: Take 5 mg by mouth 2 (two) times daily. Take 1 tablet (5 mg) by mouth 2 (two) times daily. Total of 10 mg daily), Disp: 60 tablet, Rfl: 11   aspirin EC 81 MG tablet, Take 1 tablet (81 mg total) by mouth daily. Swallow whole., Disp: 30 tablet, Rfl: 11   atorvastatin (LIPITOR) 80 MG tablet, Take 1 tablet (80 mg total) by mouth daily., Disp: 30 tablet, Rfl: 6   carvedilol (COREG) 12.5 MG tablet, Take 1 tablet (12.5 mg total) by mouth 2 (two) times daily with a meal., Disp: 60 tablet, Rfl: 6   dapagliflozin propanediol (FARXIGA) 10 MG TABS tablet, Take 1 tablet (10 mg total) by mouth daily., Disp: 30 tablet, Rfl: 11   Fluticasone-Salmeterol (ADVAIR DISKUS) 250-50 MCG/DOSE AEPB, Inhale 1 puff into the lungs 2 (two) times daily. (Patient taking differently: Inhale 1 puff into the lungs as needed.), Disp: 60 each, Rfl: 2   furosemide (LASIX) 40 MG tablet, Take 1 tablet (40 mg total) by mouth daily as needed for edema (Weight gain of 3lbs or more in 24 hours or 5lbs or more in 48 hours)., Disp: 30 tablet, Rfl: 11    isosorbide mononitrate (IMDUR) 30 MG 24 hr tablet, Take 1 tablet (30 mg total) by mouth daily., Disp: 30 tablet, Rfl: 11   potassium chloride SA (KLOR-CON M) 20 MEQ tablet, Take 1 tablet (20 mEq total) by mouth daily as needed (When you take Lasix)., Disp: 30 tablet, Rfl: 5   sacubitril-valsartan (ENTRESTO) 49-51 MG, Take 1 tablet by mouth 2 (two) times daily., Disp: 60 tablet, Rfl: 11   spironolactone (ALDACTONE) 25 MG tablet, Take 0.5 tablets (12.5 mg total) by mouth daily., Disp: 15 tablet, Rfl: 11  Past Medical History: Past Medical History:  Diagnosis Date   Blood transfusion without reported diagnosis    Hypertension     Tobacco Use: Social History   Tobacco Use  Smoking Status Former   Current packs/day: 0.00   Types: Cigarettes   Quit date: 04/22/2023   Years since quitting: 0.4  Smokeless Tobacco Never    Labs: Review Flowsheet       Latest Ref Rng & Units 04/20/2023 04/22/2023 04/25/2023 08/06/2023  Labs for ITP Cardiac and Pulmonary Rehab  Cholestrol 0 - 200 mg/dL 952  - - 841   LDL (calc) 0 - 99 mg/dL 324  - - 72   HDL-C >40 mg/dL 35  - - 38   Trlycerides <150 mg/dL 102  - - 77   Hemoglobin A1c 4.8 - 5.6 % 5.2  - - -  PH, Arterial 7.35 -  7.45 - 7.369  7.280  7.327  7.337  7.297  7.304  7.307  -  PCO2 arterial 32 - 48 mmHg - 40.6  42.2  38.9  37.2  40.9  39.2  40.5  -  Bicarbonate 20.0 - 28.0 mmol/L - 23.4  23.9  20.0  20.5  20.1  20.0  22.1  19.4  20.2  -  TCO2 22 - 32 mmol/L - 25  25  21  22  21  21  21  23  24  23  21  23  23  21   -  Acid-base deficit 0.0 - 2.0 mmol/L - 2.0  2.0  7.0  5.0  5.0  6.0  4.0  6.0  6.0  -  O2 Saturation % - 99  75  97  96  91  99  83  100  100  -    Details       Multiple values from one day are sorted in reverse-chronological order         Capillary Blood Glucose: Lab Results  Component Value Date   GLUCAP 101 (H) 04/28/2023   GLUCAP 98 04/28/2023   GLUCAP 145 (H) 04/28/2023   GLUCAP 161 (H) 04/27/2023   GLUCAP 119 (H)  04/27/2023     Exercise Target Goals: Exercise Program Goal: Individual exercise prescription set using results from initial 6 min walk test and THRR while considering  patient's activity barriers and safety.   Exercise Prescription Goal: Starting with aerobic activity 30 plus minutes a day, 3 days per week for initial exercise prescription. Provide home exercise prescription and guidelines that participant acknowledges understanding prior to discharge.  Activity Barriers & Risk Stratification:  Activity Barriers & Cardiac Risk Stratification - 06/28/23 1232       Activity Barriers & Cardiac Risk Stratification   Activity Barriers Shortness of Breath;Back Problems;Incisional Pain    Cardiac Risk Stratification High             6 Minute Walk:  6 Minute Walk     Row Name 06/28/23 1338 09/06/23 1129       6 Minute Walk   Phase Initial Discharge    Distance 1350 feet 1430 feet  initial test rescored distance  1290 ft    Distance % Change -- 10.8 %    Distance Feet Change -- 140 ft    Walk Time 6 minutes 6 minutes    # of Rest Breaks 0 0    MPH 2.55 2.71    METS 4.39 4.92    RPE 12 14    VO2 Peak 15.37 17.22    Symptoms No No    Resting HR 69 bpm 76 bpm    Resting BP 102/76 124/62    Resting Oxygen Saturation  97 % --    Exercise Oxygen Saturation  during 6 min walk 97 % --    Max Ex. HR 66 bpm 117 bpm    Max Ex. BP 116/70 128/62    2 Minute Post BP 108/70 --             Oxygen Initial Assessment:   Oxygen Re-Evaluation:   Oxygen Discharge (Final Oxygen Re-Evaluation):   Initial Exercise Prescription:  Initial Exercise Prescription - 06/28/23 1300       Date of Initial Exercise RX and Referring Provider   Date 06/28/23    Referring Provider Dr. Dorris Fetch    Expected Discharge Date 09/18/23      Treadmill  MPH 1.4    Grade 0    Minutes 17      NuStep   Level 1    SPM 60    Minutes 22      Prescription Details   Frequency (times per  week) 3    Duration Progress to 30 minutes of continuous aerobic without signs/symptoms of physical distress      Intensity   THRR 40-80% of Max Heartrate 70-141    Ratings of Perceived Exertion 11-13      Resistance Training   Training Prescription Yes    Weight 4    Reps 10-15             Perform Capillary Blood Glucose checks as needed.  Exercise Prescription Changes:   Exercise Prescription Changes     Row Name 07/01/23 1200 07/12/23 1100 07/15/23 1500 07/29/23 1300 08/12/23 1300     Response to Exercise   Blood Pressure (Admit) 108/70 -- 104/74 104/64 92/60   Blood Pressure (Exercise) 126/68 -- 110/74 116/68 --   Blood Pressure (Exit) 108/70 -- 102/60 100/56 98/60   Heart Rate (Admit) 59 bpm -- 70 bpm 74 bpm 76 bpm   Heart Rate (Exercise) 75 bpm -- 76 bpm 90 bpm 92 bpm   Heart Rate (Exit) 67 bpm -- 61 bpm 83 bpm 75 bpm   Rating of Perceived Exertion (Exercise) 12 -- 12 12 12    Duration Continue with 30 min of aerobic exercise without signs/symptoms of physical distress. -- Continue with 30 min of aerobic exercise without signs/symptoms of physical distress. Continue with 30 min of aerobic exercise without signs/symptoms of physical distress. Continue with 30 min of aerobic exercise without signs/symptoms of physical distress.   Intensity THRR unchanged -- THRR unchanged THRR unchanged THRR unchanged     Progression   Progression Continue to progress workloads to maintain intensity without signs/symptoms of physical distress. -- Continue to progress workloads to maintain intensity without signs/symptoms of physical distress. Continue to progress workloads to maintain intensity without signs/symptoms of physical distress. Continue to progress workloads to maintain intensity without signs/symptoms of physical distress.     Resistance Training   Training Prescription Yes -- Yes Yes Yes   Weight 4 -- 4 4 4    Reps 10-15 -- 10-15 10-15 10-15   Time 10 Minutes -- -- -- --      Treadmill   MPH 1.4 -- 1.5 2 3    Grade 0 -- 0.5 1.5 3   Minutes 17 -- 15 15 15    METs 2.07 -- 2.25 2.95 4.54     NuStep   Level 1 -- 2 4 4    SPM 54 -- 73 89 85   Minutes 22 -- 15 15 15    METs 1.73 -- 2 2.95 2.7     Home Exercise Plan   Plans to continue exercise at -- Home (comment)  walking, weights -- -- --   Frequency -- Add 2 additional days to program exercise sessions. -- -- --   Initial Home Exercises Provided -- 07/12/23 -- -- --     Oxygen   Maintain Oxygen Saturation -- -- 88% or higher 88% or higher 88% or higher    Row Name 09/09/23 1300             Response to Exercise   Blood Pressure (Admit) 118/72       Blood Pressure (Exit) 102/62       Heart Rate (Admit) 88 bpm  Heart Rate (Exercise) 110 bpm       Heart Rate (Exit) 84 bpm       Rating of Perceived Exertion (Exercise) 15       Duration Continue with 30 min of aerobic exercise without signs/symptoms of physical distress.       Intensity THRR unchanged         Progression   Progression Continue to progress workloads to maintain intensity without signs/symptoms of physical distress.         Resistance Training   Training Prescription Yes       Weight 5       Reps 10-15         Treadmill   MPH 3.5       Grade 1       Minutes 15       METs 5.61         NuStep   Level 6       SPM 67       Minutes 15       METs 3         Home Exercise Plan   Plans to continue exercise at Home (comment)       Frequency Add 2 additional days to program exercise sessions.         Oxygen   Maintain Oxygen Saturation 88% or higher                Exercise Comments:   Exercise Goals and Review:   Exercise Goals     Row Name 06/28/23 1340 07/15/23 1553           Exercise Goals   Increase Physical Activity Yes Yes      Intervention Provide advice, education, support and counseling about physical activity/exercise needs.;Develop an individualized exercise prescription for aerobic and resistive  training based on initial evaluation findings, risk stratification, comorbidities and participant's personal goals. Provide advice, education, support and counseling about physical activity/exercise needs.;Develop an individualized exercise prescription for aerobic and resistive training based on initial evaluation findings, risk stratification, comorbidities and participant's personal goals.      Expected Outcomes Short Term: Attend rehab on a regular basis to increase amount of physical activity.;Long Term: Add in home exercise to make exercise part of routine and to increase amount of physical activity.;Long Term: Exercising regularly at least 3-5 days a week. Short Term: Attend rehab on a regular basis to increase amount of physical activity.;Long Term: Add in home exercise to make exercise part of routine and to increase amount of physical activity.;Long Term: Exercising regularly at least 3-5 days a week.      Increase Strength and Stamina Yes Yes      Intervention Provide advice, education, support and counseling about physical activity/exercise needs.;Develop an individualized exercise prescription for aerobic and resistive training based on initial evaluation findings, risk stratification, comorbidities and participant's personal goals. Provide advice, education, support and counseling about physical activity/exercise needs.;Develop an individualized exercise prescription for aerobic and resistive training based on initial evaluation findings, risk stratification, comorbidities and participant's personal goals.      Expected Outcomes Short Term: Increase workloads from initial exercise prescription for resistance, speed, and METs.;Short Term: Perform resistance training exercises routinely during rehab and add in resistance training at home;Long Term: Improve cardiorespiratory fitness, muscular endurance and strength as measured by increased METs and functional capacity ( ) Short Term: Increase  workloads from initial exercise prescription for resistance, speed, and METs.;Short Term: Perform resistance training  exercises routinely during rehab and add in resistance training at home;Long Term: Improve cardiorespiratory fitness, muscular endurance and strength as measured by increased METs and functional capacity ( )      Able to understand and use rate of perceived exertion (RPE) scale Yes Yes      Intervention Provide education and explanation on how to use RPE scale Provide education and explanation on how to use RPE scale      Expected Outcomes Short Term: Able to use RPE daily in rehab to express subjective intensity level;Long Term:  Able to use RPE to guide intensity level when exercising independently Short Term: Able to use RPE daily in rehab to express subjective intensity level;Long Term:  Able to use RPE to guide intensity level when exercising independently      Knowledge and understanding of Target Heart Rate Range (THRR) Yes Yes      Intervention Provide education and explanation of THRR including how the numbers were predicted and where they are located for reference Provide education and explanation of THRR including how the numbers were predicted and where they are located for reference      Expected Outcomes Short Term: Able to state/look up THRR;Long Term: Able to use THRR to govern intensity when exercising independently;Short Term: Able to use daily as guideline for intensity in rehab Short Term: Able to state/look up THRR;Long Term: Able to use THRR to govern intensity when exercising independently;Short Term: Able to use daily as guideline for intensity in rehab      Able to check pulse independently Yes Yes      Intervention Provide education and demonstration on how to check pulse in carotid and radial arteries.;Review the importance of being able to check your own pulse for safety during independent exercise Provide education and demonstration on how to check pulse in  carotid and radial arteries.;Review the importance of being able to check your own pulse for safety during independent exercise      Expected Outcomes Short Term: Able to explain why pulse checking is important during independent exercise;Long Term: Able to check pulse independently and accurately Short Term: Able to explain why pulse checking is important during independent exercise;Long Term: Able to check pulse independently and accurately      Understanding of Exercise Prescription Yes Yes      Intervention Provide education, explanation, and written materials on patient's individual exercise prescription Provide education, explanation, and written materials on patient's individual exercise prescription      Expected Outcomes Short Term: Able to explain program exercise prescription;Long Term: Able to explain home exercise prescription to exercise independently Short Term: Able to explain program exercise prescription;Long Term: Able to explain home exercise prescription to exercise independently               Exercise Goals Re-Evaluation :  Exercise Goals Re-Evaluation     Row Name 07/12/23 1134 07/15/23 1553 07/26/23 1131 07/29/23 1342 07/29/23 1353     Exercise Goal Re-Evaluation   Exercise Goals Review Increase Physical Activity;Increase Strength and Stamina;Able to understand and use rate of perceived exertion (RPE) scale;Able to understand and use Dyspnea scale;Knowledge and understanding of Target Heart Rate Range (THRR);Able to check pulse independently;Understanding of Exercise Prescription Increase Physical Activity;Increase Strength and Stamina;Able to understand and use rate of perceived exertion (RPE) scale;Able to check pulse independently;Knowledge and understanding of Target Heart Rate Range (THRR);Understanding of Exercise Prescription Increase Physical Activity;Increase Strength and Stamina;Understanding of Exercise Prescription -- --   Comments Reviewed home  exercise with pt  today.  Pt plans to walk and use weights at home for exercise.  Reviewed THR, pulse, RPE, sign and symptoms, pulse oximetery and when to call 911 or MD.  Also discussed weather considerations and indoor options.  Pt voiced understanding. Pt has completed 8 sessions of cardiac rehab. He is motivated during class to exercise and plans to exercise at home on his days out of class. He is currently exerciisng at 2.25 METs on the treadmill. Will continue to monitor and progress as able, Patient is doing well in the program. He continues to be motivated during class. He is walking at home for exercise. He walks in his neighbor using a pokiemon go. He does this on tlhe days he does not come to CR and is able to increase his distance. Pating is increasing his levels on both hte treadmill and the stepper. HE continues to be motivated anf enjpys coming to rehab Nate is increasing his workloads on both the treadmill and stepper. He is enjoying coming to rehab.   Expected Outcomes Short: Start to add in exerise at home on off days Long; Continue to exercise independently Through exercise at rehab and home, patient will achieve their goals. Short term: Patient will continue to exercise by walking at home. Long term: Patient will continue to work toward increasing his strength and stamina and improve his SOB. -- --    Row Name 08/16/23 1359 08/19/23 1114           Exercise Goal Re-Evaluation   Exercise Goals Review Increase Physical Activity;Understanding of Exercise Prescription;Increase Strength and Stamina Increase Physical Activity;Increase Strength and Stamina;Understanding of Exercise Prescription      Comments Nate has increased his speed on the treadmill with a speed of 3.0 and grade of 3.0. Will continue to monitor and progress as able, Nate is doing well in rehab.  He is walking at home on his off days for 45 min each way for a total 90 min.  He has noted that he has been getting dizzy during the walk.  We  talked about making sure he is getting enough water.  He has noticed that his stamina is increasing and he is feeling better overall.      Expected Outcomes Short term: increase level on the NuStep to level 5   long term: continue to attend cardiac rehab sessions Short: continue to walk and drink more Long: Conitnue to improve stamina                Discharge Exercise Prescription (Final Exercise Prescription Changes):  Exercise Prescription Changes - 09/09/23 1300       Response to Exercise   Blood Pressure (Admit) 118/72    Blood Pressure (Exit) 102/62    Heart Rate (Admit) 88 bpm    Heart Rate (Exercise) 110 bpm    Heart Rate (Exit) 84 bpm    Rating of Perceived Exertion (Exercise) 15    Duration Continue with 30 min of aerobic exercise without signs/symptoms of physical distress.    Intensity THRR unchanged      Progression   Progression Continue to progress workloads to maintain intensity without signs/symptoms of physical distress.      Resistance Training   Training Prescription Yes    Weight 5    Reps 10-15      Treadmill   MPH 3.5    Grade 1    Minutes 15    METs 5.61  NuStep   Level 6    SPM 67    Minutes 15    METs 3      Home Exercise Plan   Plans to continue exercise at Home (comment)    Frequency Add 2 additional days to program exercise sessions.      Oxygen   Maintain Oxygen Saturation 88% or higher             Nutrition:  Target Goals: Understanding of nutrition guidelines, daily intake of sodium 1500mg , cholesterol 200mg , calories 30% from fat and 7% or less from saturated fats, daily to have 5 or more servings of fruits and vegetables.  Biometrics:  Pre Biometrics - 06/28/23 1341       Pre Biometrics   Height 5\' 10"  (1.778 m)    Weight 69.9 kg    Waist Circumference 35 inches    Hip Circumference 36 inches    Waist to Hip Ratio 0.97 %    BMI (Calculated) 22.11    Triceps Skinfold 5 mm    % Body Fat 17.9 %    Grip  Strength 20.6 kg    Flexibility 0 in    Single Leg Stand 0 seconds             Post Biometrics - 09/06/23 1130        Post  Biometrics   Height 5\' 10"  (1.778 m)    Weight 74.7 kg    Waist Circumference 34 inches    Hip Circumference 35 inches    Waist to Hip Ratio 0.97 %    BMI (Calculated) 23.63    Grip Strength 25.2 kg    Single Leg Stand 7 seconds             Nutrition Therapy Plan and Nutrition Goals:  Nutrition Therapy & Goals - 07/08/23 1035       Nutrition Therapy   RD appointment deferred Yes      Personal Nutrition Goals   Comments We provide educational sessions on heart healthy nutrition with handouts.      Intervention Plan   Intervention Nutrition handout(s) given to patient.    Expected Outcomes Short Term Goal: Understand basic principles of dietary content, such as calories, fat, sodium, cholesterol and nutrients.             Nutrition Assessments:  Nutrition Assessments - 09/19/23 0742       MEDFICTS Scores   Pre Score 30    Post Score 27    Score Difference -3            MEDIFICTS Score Key: >=70 Need to make dietary changes  40-70 Heart Healthy Diet <= 40 Therapeutic Level Cholesterol Diet   Picture Your Plate Scores: <16 Unhealthy dietary pattern with much room for improvement. 41-50 Dietary pattern unlikely to meet recommendations for good health and room for improvement. 51-60 More healthful dietary pattern, with some room for improvement.  >60 Healthy dietary pattern, although there may be some specific behaviors that could be improved.    Nutrition Goals Re-Evaluation:  Nutrition Goals Re-Evaluation     Row Name 07/26/23 1119 08/19/23 1123           Goals   Nutrition Goal Patient wants to decrease the amount of ice cream he eats and eat more healthy meat. Short Term: cut back on ice cream and increase healthy proteins. Long term: Continue eating healthy and adquate calorie intake.      Comment Patinet says he  is eating a lot of salads, fruits especially blueberriers and vegetables. He does not eat any fried foods or a lot of meat. He says he knows he is eating too much ice cream. He is working on cutting back. We will continue to monitor. Nate is doing well in rehab.  He admits to needing to eat better.  He has still been eating his ice cream and cheeseburgers.  His taste buds are back and he is enjoying his eating again.  We talked about picking healthier options and getting the ones with less sugar.  He is still eating salads to get in his vegetables in.      Expected Outcome Short Term: cut back on ice cream and increase healthy proteins. Long term: Continue eating healthy and adquate calorie intake. Short: Cut back on ice cream Long: Try to aim for healthier versions of favortie snacks.               Nutrition Goals Discharge (Final Nutrition Goals Re-Evaluation):  Nutrition Goals Re-Evaluation - 08/19/23 1123       Goals   Nutrition Goal Short Term: cut back on ice cream and increase healthy proteins. Long term: Continue eating healthy and adquate calorie intake.    Comment Nate is doing well in rehab.  He admits to needing to eat better.  He has still been eating his ice cream and cheeseburgers.  His taste buds are back and he is enjoying his eating again.  We talked about picking healthier options and getting the ones with less sugar.  He is still eating salads to get in his vegetables in.    Expected Outcome Short: Cut back on ice cream Long: Try to aim for healthier versions of favortie snacks.             Psychosocial: Target Goals: Acknowledge presence or absence of significant depression and/or stress, maximize coping skills, provide positive support system. Participant is able to verbalize types and ability to use techniques and skills needed for reducing stress and depression.  Initial Review & Psychosocial Screening:  Initial Psych Review & Screening - 06/28/23 1335        Initial Review   Current issues with Current Anxiety/Panic;History of Depression;Current Depression;Current Stress Concerns    Source of Stress Concerns Family;Financial    Comments Patient lost his job when he has his CABG. He is not able to pay child support and his son's mother does not allow him to have a relationship with his son and this along with no income is very stressful.      Family Dynamics   Good Support System? Yes      Barriers   Psychosocial barriers to participate in program There are no identifiable barriers or psychosocial needs.      Screening Interventions   Interventions Encouraged to exercise;To provide support and resources with identified psychosocial needs;Provide feedback about the scores to participant    Expected Outcomes Long Term goal: The participant improves quality of Life and PHQ9 Scores as seen by post scores and/or verbalization of changes;Short Term goal: Utilizing psychosocial counselor, staff and physician to assist with identification of specific Stressors or current issues interfering with healing process. Setting desired goal for each stressor or current issue identified.;Long Term Goal: Stressors or current issues are controlled or eliminated.;Short Term goal: Identification and review with participant of any Quality of Life or Depression concerns found by scoring the questionnaire.  Quality of Life Scores:  Quality of Life - 09/19/23 0741       Quality of Life   Select Quality of Life      Quality of Life Scores   Health/Function Pre 26 %    Health/Function Post 23.57 %    Health/Function % Change -9.35 %    Socioeconomic Pre 30 %    Socioeconomic Post 13.5 %    Socioeconomic % Change  -55 %    Psych/Spiritual Pre 30 %    Psych/Spiritual Post 30 %    Psych/Spiritual % Change 0 %    Family Pre 30 %    Family Post 30 %    Family % Change 0 %    GLOBAL Pre 28.29 %    GLOBAL Post 23.27 %    GLOBAL % Change -17.74 %             Scores of 19 and below usually indicate a poorer quality of life in these areas.  A difference of  2-3 points is a clinically meaningful difference.  A difference of 2-3 points in the total score of the Quality of Life Index has been associated with significant improvement in overall quality of life, self-image, physical symptoms, and general health in studies assessing change in quality of life.  PHQ-9: Review Flowsheet       09/19/2023 08/19/2023 06/28/2023  Depression screen PHQ 2/9  Decreased Interest 0 0 0  Down, Depressed, Hopeless 0 0 3  PHQ - 2 Score 0 0 3  Altered sleeping 3 2 3   Tired, decreased energy 0 0 3  Change in appetite 3 0 3  Feeling bad or failure about yourself  0 0 3  Trouble concentrating 0 0 3  Moving slowly or fidgety/restless 0 0 0  Suicidal thoughts 0 0 0  PHQ-9 Score 6 2 18   Difficult doing work/chores Extremely dIfficult Somewhat difficult Extremely dIfficult    Details           Interpretation of Total Score  Total Score Depression Severity:  1-4 = Minimal depression, 5-9 = Mild depression, 10-14 = Moderate depression, 15-19 = Moderately severe depression, 20-27 = Severe depression   Psychosocial Evaluation and Intervention:  Psychosocial Evaluation - 06/28/23 1337       Psychosocial Evaluation & Interventions   Interventions Stress management education;Relaxation education;Encouraged to exercise with the program and follow exercise prescription    Comments Patient has no psychosocial barriers to participate in CR identified at his orientation visit. His PHQ-9 score was 18. He says he has had depression and anxiety most all of his life. He says he has never tried any treatment and does not want to at this time. He feels like he is able to manage and cope on his own. 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 He is interested in vocational rehab. He lives with his mother. He did state he has been  homeless a few years ago but his mother moved inorder to allow him to live with her. He has a 72 year old son. He says the son's mother moves around a lot and does not allow him to have a relationship with him and this "bothers" him a lot along with his lack of financial income. He says his mother does support him with some of his bills, food, and transportation. He says he looking forward to participating in the program hoping to get stronger  and be able to get employment.    Expected Outcomes Patient will continue to have no psychosocial barriers identified.    Continue Psychosocial Services  No Follow up required             Psychosocial Re-Evaluation:  Psychosocial Re-Evaluation     Row Name 07/08/23 1036 07/26/23 1107 08/19/23 1116         Psychosocial Re-Evaluation   Current issues with History of Depression;Current Anxiety/Panic History of Depression;Current Anxiety/Panic History of Depression;Current Anxiety/Panic     Comments Patient is new to the program. He has completed 4 sessions. He continues to have no psychosocial barriers identified. He continues to feels his depression and anxiety are managed without treatment. He seems to enjoy the sessions and demonstrates an interest in improving his health. Patient doing well in the program. He states his SOB is improving and he is able to walk to the end of his block without fatigue or geting SOB. He feels he is geting stronger and his stamina is improving. His mother has been his transportation source and support and she is currently out of town caring for a family member. His brother is bringing him to CR and "takig care of him" he feels this has been a stressor for his brother. His mother is coming home this weekend and he is glad. He has ongoing stress over not being able to have a relationship with his son who lives in [place] with his mother. He says she has blocked him from all communication. He feels he mananges his stress as  best he can. He is working with vocational rehab to hopefully find employment. He says he still worries because he has to pay 5 years back pay for child support. His surgeon has not released him to work. He has appointment with him in September and hopes to be released. We will continue to monitor. Nate is doing well in rehab.  He is feeling good overall and feels that his mental health is improving.  He has not been sleeping well and has constant trips to bathroom and then has difficulty getting back to sleep which does mess with his mentality.  Overall, his PHQ has gone from 18  down to 2!!  He says it's from moving more and feeling better overall. He would like to try melatonin to help with sleep.  He was cautioned against using nightly and try with small dose. We also talked about no blue light screens for 30 min before bed and no TV on at bedtimes.     Expected Outcomes Patient will continue to have no psychosocial barriers identified. Goals: Short Term: Continue to work with vocational rehab. 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 Short: Try melatonin and getting away from screens prior to bed time Long: Conitnue to exercise for mental boost.     Interventions Stress management education;Relaxation education;Encouraged to attend Cardiac Rehabilitation for the exercise Stress management education;Encouraged to attend Cardiac Rehabilitation for the exercise;Relaxation education Stress management education;Encouraged to attend Cardiac Rehabilitation for the exercise     Continue Psychosocial Services  No Follow up required No Follow up required Follow up required by staff              Psychosocial Discharge (Final Psychosocial Re-Evaluation):  Psychosocial Re-Evaluation - 08/19/23 1116       Psychosocial Re-Evaluation   Current issues with History of Depression;Current Anxiety/Panic    Comments Nate is doing  well in rehab.  He is feeling good overall  and feels that his mental health is improving.  He has not been sleeping well and has constant trips to bathroom and then has difficulty getting back to sleep which does mess with his mentality.  Overall, his PHQ has gone from 18  down to 2!!  He says it's from moving more and feeling better overall. He would like to try melatonin to help with sleep.  He was cautioned against using nightly and try with small dose. We also talked about no blue light screens for 30 min before bed and no TV on at bedtimes.    Expected Outcomes Short: Try melatonin and getting away from screens prior to bed time Long: Conitnue to exercise for mental boost.    Interventions Stress management education;Encouraged to attend Cardiac Rehabilitation for the exercise    Continue Psychosocial Services  Follow up required by staff             Vocational Rehabilitation: Provide vocational rehab assistance to qualifying candidates.   Vocational Rehab Evaluation & Intervention:  Vocational Rehab - 07/26/23 1117       Vocational Rehab Re-Evaulation   Comments Patient has received documentation from [facility] and is working on completing it. He says they will make an inperson appointment when he turns his paperwork in. He plans to get this completed soon. We will continue to monitor.             Education: Education Goals: Education classes will be provided on a weekly basis, covering required topics. Participant will state understanding/return demonstration of topics presented.  Learning Barriers/Preferences:  Learning Barriers/Preferences - 06/28/23 1322       Learning Barriers/Preferences   Learning Barriers None    Learning Preferences Written Material;Audio;Skilled Demonstration             Education Topics: Hypertension, Hypertension Reduction -Define heart disease and high blood pressure. Discus how high blood pressure affects the body and ways to reduce high blood pressure. Flowsheet Row CARDIAC REHAB  PHASE II EXERCISE from 09/18/2023 in [place] CARDIAC REHABILITATION  Date 07/03/23  Educator HB  Instruction Review Code 1- Verbalizes Understanding       Exercise and Your Heart -Discuss why it is important to exercise, the FITT principles of exercise, normal and abnormal responses to exercise, and how to exercise safely. Flowsheet Row CARDIAC REHAB PHASE II EXERCISE from 09/18/2023 in [place] CARDIAC REHABILITATION  Date 07/10/23  Educator [facility]  Instruction Review Code 1- Verbalizes Understanding       Angina -Discuss definition of angina, causes of angina, treatment of angina, and how to decrease risk of having angina. Flowsheet Row CARDIAC REHAB PHASE II EXERCISE from 09/18/2023 in [place] CARDIAC REHABILITATION  Date 07/17/23  Educator HW  Instruction Review Code 1- Verbalizes Understanding       Cardiac Medications -Review what the following cardiac medications are used for, how they affect the body, and side effects that may occur when taking the medications.  Medications include Aspirin, Beta blockers, calcium channel blockers, ACE Inhibitors, angiotensin receptor blockers, diuretics, digoxin, and antihyperlipidemics. Flowsheet Row CARDIAC REHAB PHASE II EXERCISE from 09/18/2023 in [place] CARDIAC REHABILITATION  Date 07/24/23  Educator HB  Instruction Review Code 1- Verbalizes Understanding       Congestive Heart Failure -Discuss the definition of CHF, how to live with CHF, the signs and symptoms of CHF, and how keep track of weight and sodium intake. Flowsheet Row  CARDIAC REHAB PHASE II EXERCISE from 09/18/2023 in [place] [facility]  Date 09/11/23  Educator [facility]  Instruction Review Code 2- Demonstrated Understanding       Heart Disease and Intimacy -Discus the effect sexual activity has on the heart, how changes occur during intimacy as we age, and safety during sexual activity. Flowsheet Row CARDIAC REHAB PHASE II EXERCISE from 09/18/2023  in [place] CARDIAC REHABILITATION  Date 08/07/23  Educator [facility]  Instruction Review Code 1- Verbalizes Understanding       Smoking Cessation / COPD -Discuss different methods to quit smoking, the health benefits of quitting smoking, and the definition of COPD.   Nutrition I: Fats -Discuss the types of cholesterol, what cholesterol does to the heart, and how cholesterol levels can be controlled.   Nutrition II: Labels -Discuss the different components of food labels and how to read food label   Heart Parts/Heart Disease and PAD -Discuss the anatomy of the heart, the pathway of blood circulation through the heart, and these are affected by heart disease.   Stress I: Signs and Symptoms -Discuss the causes of stress, how stress may lead to anxiety and depression, and ways to limit stress. Flowsheet Row CARDIAC REHAB PHASE II EXERCISE from 09/18/2023 in [place] CARDIAC REHABILITATION  Date 08/21/23  Educator [facility]  Instruction Review Code 2- Demonstrated Understanding       Stress II: Relaxation -Discuss different types of relaxation techniques to limit stress. Flowsheet Row CARDIAC REHAB PHASE II EXERCISE from 09/18/2023 in [place] CARDIAC REHABILITATION  Date 08/21/23  Educator [facility]  Instruction Review Code 2- Demonstrated Understanding       Warning Signs of Stroke / TIA -Discuss definition of a stroke, what the signs and symptoms are of a stroke, and how to identify when someone is having stroke.   Knowledge Questionnaire Score:  Knowledge Questionnaire Score - 09/19/23 0740       Knowledge Questionnaire Score   Pre Score 22/24    Post Score 22/24             Core Components/Risk Factors/Patient Goals at Admission:  Personal Goals and Risk Factors at Admission - 06/28/23 1323       Core Components/Risk Factors/Patient Goals on Admission    Weight Management Weight Maintenance    Improve shortness of breath with ADL's Yes    Intervention Provide  education, individualized exercise plan and daily activity instruction to help decrease symptoms of SOB with activities of daily living.    Expected Outcomes Short Term: Improve cardiorespiratory fitness to achieve a reduction of symptoms when performing ADLs;Long Term: Be able to perform more ADLs without symptoms or delay the onset of symptoms    Hypertension Yes    Intervention Provide education on lifestyle modifcations including regular physical activity/exercise, weight management, moderate sodium restriction and increased consumption of fresh fruit, vegetables, and low fat dairy, alcohol moderation, and smoking cessation.;Monitor prescription use compliance.    Expected Outcomes Short Term: Continued assessment and intervention until BP is < 140/37mm HG in hypertensive participants. < 130/66mm HG in hypertensive participants with diabetes, heart failure or chronic kidney disease.;Long Term: Maintenance of blood pressure at goal levels.    Lipids Yes    Intervention Provide education and support for participant on nutrition & aerobic/resistive exercise along with prescribed medications to achieve LDL 70mg , HDL >40mg .    Expected Outcomes Short Term: Participant states understanding of desired cholesterol values and is compliant with medications prescribed. Participant is following exercise  prescription and nutrition guidelines.;Long Term: Cholesterol controlled with medications as prescribed, with individualized exercise RX and with personalized nutrition plan. Value goals: LDL < 70mg , HDL > 40 mg.    Stress Yes    Intervention Offer individual and/or small group education and counseling on adjustment to heart disease, stress management and health-related lifestyle change. Teach and support self-help strategies.;Refer participants experiencing significant psychosocial distress to appropriate mental health specialists for further evaluation and treatment. When possible, include family members and  significant others in education/counseling sessions.    Expected Outcomes Short Term: Participant demonstrates changes in health-related behavior, relaxation and other stress management skills, ability to obtain effective social support, and compliance with psychotropic medications if prescribed.;Long Term: Emotional wellbeing is indicated by absence of clinically significant psychosocial distress or social isolation.    Personal Goal Other Yes    Personal Goal Patient wants to improve his strength and stamina; Walk longer distance without getting SOB; and be able to go back to work.    Intervention Patient will attend CR 3 days/week with exercise and education.    Expected Outcomes Patient will complete the program meeting both personal and program goals.             Core Components/Risk Factors/Patient Goals Review:   Goals and Risk Factor Review     Row Name 07/08/23 1038 07/26/23 1121 08/19/23 1125         Core Components/Risk Factors/Patient Goals Review   Personal Goals Review Weight Management/Obesity;Lipids;Hypertension;Stress;Other Weight Management/Obesity;Lipids;Hypertension Weight Management/Obesity;Lipids;Hypertension     Review Patient was referred to CR with [facility]. He has multiple risk factors for CAD and is participating in the program for risk modificaiton. He has completed 4 sessions. His current weight is 155.0 lbs losing 0.7 bls from his intial visit. He is doing well in the program. His blood pressure is at goal. 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 We will continue to monitor his progress as he works towards meeting these goals. Patient is doing well in the program with progressions and consistent attendance. His blood pressure continues to be well controlled. He does montior his blood pressure at home and it is always below 110 systolic. He  says some mornings it is in the 90's and below 90 a few occaions. He denies any chronic dizziness. He says he get dizzy occasionally when he gets up too quickly I encouaged him to continue to monitor his blood pressue and takes his readings in when he sees [place], NP and tell her about the low readings and positional dizziness. He is maintaining his weight. His current weight 152 lbs. He has lost 5 lbs from his initial weight. He is compliant with his medications. He has an upcoming ECHO and an appointmet to see E. Philis Nettle 8/15. No recent lipid panel on file. We will continue to monitor. Nate is doing well in reheb.  His weight has been going up since he has been eating again.  His weight is up about 20 lb since starting and he feels he is gaining too fast from eating poorly.  We talked about how his grafts could occlude too if he does not adapt healthier eating habits.  His pressures are doing well for most part, however, recently they have been too low in the 90s for him.  He has noted some dizzy spells  but did not attribute it to anything other than maybe dehydrated so he was encouraged to check his pressure when he feels dizzy and to report numbers to doctor to help with BP issues.     Expected Outcomes Patient will complete the program meeting both personal and program goals. Short Term: Long term Goals: blood pressure will continue to be at goal and weight maintained. Short: Keep close eye on pressures and dizzy spells Long: Conitnue to work on weight loss again with improving diet.              Core Components/Risk Factors/Patient Goals at Discharge (Final Review):   Goals and Risk Factor Review - 08/19/23 1125       Core Components/Risk Factors/Patient Goals Review   Personal Goals Review Weight Management/Obesity;Lipids;Hypertension    Review Nate is doing well in reheb.  His weight has been going up since he has been eating again.  His weight is up about 20 lb since starting and he feels he is  gaining too fast from eating poorly.  We talked about how his grafts could occlude too if he does not adapt healthier eating habits.  His pressures are doing well for most part, however, recently they have been too low in the 90s for him.  He has noted some dizzy spells but did not attribute it to anything other than maybe dehydrated so he was encouraged to check his pressure when he feels dizzy and to report numbers to doctor to help with BP issues.    Expected Outcomes Short: Keep close eye on pressures and dizzy spells Long: Conitnue to work on weight loss again with improving diet.             ITP Comments:  ITP Comments     Row Name 07/05/23 1259 07/17/23 1304 08/14/23 0815 08/30/23 1115 09/11/23 0817   ITP Comments Gave patient vocational rehab packet 07/03/23. He returned it and it was faxed to vocational rehab 07/05/23. 30 day review completed. ITP sent to Dr. Dina Rich, Medical Director of Cardiac Rehab. Continue with ITP unless changes are made by physician. 30 day review completed. ITP sent to Dr. Dina Rich, Medical Director of Cardiac Rehab. Continue with ITP unless changes are made by physician. Nathans BP started out lower today 82/60; he was given 2 bottles of water and rechecked 94/64 and also rechecked buring exercise 96/60 30 day review completed. ITP sent to Dr. Dina Rich, Medical Director of Cardiac Rehab. Continue with ITP unless changes are made by physician.    Row Name 09/18/23 1046           ITP Comments Xavyer graduated today from  rehab with 36 sessions completed.  Details of the patient's exercise prescription and what He needs to do in order to continue the prescription and progress were discussed with patient.  Patient was given a copy of prescription and goals.  Patient verbalized understanding. Jawone plans to continue to exercise by exercising at home.                Comments: Discharge ITP

## 2023-09-19 NOTE — Progress Notes (Signed)
Discharge Progress Report  Patient Details  Name: Caleb Perkins MRN: [id number] Date of Birth: [birth year] Referring Provider:   Flowsheet Row CARDIAC REHAB PHASE II ORIENTATION from 06/28/2023 in [facility] CARDIAC REHABILITATION  Referring Provider Dr. Dorris Fetch        Number of Visits: 36  Reason for Discharge:  Patient reached a stable level of exercise. Patient independent in their exercise. Patient has met program and personal goals.  Smoking History:  Social History   Tobacco Use  Smoking Status Former   Current packs/day: 0.00   Types: Cigarettes   Quit date: 04/22/2023   Years since quitting: 0.4  Smokeless Tobacco Never    Diagnosis:  S/P CABG x 5  NSTEMI (non-ST elevated myocardial infarction) [facility])    Initial Exercise Prescription:  Initial Exercise Prescription - 06/28/23 1300       Date of Initial Exercise RX and Referring Provider   Date 06/28/23    Referring Provider Dr. Dorris Fetch    Expected Discharge Date 09/18/23      Treadmill   MPH 1.4    Grade 0    Minutes 17      NuStep   Level 1    SPM 60    Minutes 22      Prescription Details   Frequency (times per week) 3    Duration Progress to 30 minutes of continuous aerobic without signs/symptoms of physical distress      Intensity   THRR 40-80% of Max Heartrate 70-141    Ratings of Perceived Exertion 11-13      Resistance Training   Training Prescription Yes    Weight 4    Reps 10-15             Discharge Exercise Prescription (Final Exercise Prescription Changes):  Exercise Prescription Changes - 09/09/23 1300       Response to Exercise   Blood Pressure (Admit) 118/72    Blood Pressure (Exit) 102/62    Heart Rate (Admit) 88 bpm    Heart Rate (Exercise) 110 bpm    Heart Rate (Exit) 84 bpm    Rating of Perceived Exertion (Exercise) 15    Duration Continue with 30 min of aerobic exercise without signs/symptoms of physical distress.    Intensity THRR unchanged       Progression   Progression Continue to progress workloads to maintain intensity without signs/symptoms of physical distress.      Resistance Training   Training Prescription Yes    Weight 5    Reps 10-15      Treadmill   MPH 3.5    Grade 1    Minutes 15    METs 5.61      NuStep   Level 6    SPM 67    Minutes 15    METs 3      Home Exercise Plan   Plans to continue exercise at Home (comment)    Frequency Add 2 additional days to program exercise sessions.      Oxygen   Maintain Oxygen Saturation 88% or higher             Functional Capacity:  6 Minute Walk     Row Name 06/28/23 1338 09/06/23 1129       6 Minute Walk   Phase Initial Discharge    Distance 1350 feet 1430 feet  initial test rescored distance  1290 ft    Distance % Change -- 10.8 %    Distance Feet  Change -- 140 ft    Walk Time 6 minutes 6 minutes    # of Rest Breaks 0 0    MPH 2.55 2.71    METS 4.39 4.92    RPE 12 14    VO2 Peak 15.37 17.22    Symptoms No No    Resting HR 69 bpm 76 bpm    Resting BP 102/76 124/62    Resting Oxygen Saturation  97 % --    Exercise Oxygen Saturation  during 6 min walk 97 % --    Max Ex. HR 66 bpm 117 bpm    Max Ex. BP 116/70 128/62    2 Minute Post BP 108/70 --             Psychological, QOL, Others - Outcomes: PHQ 2/9:    09/19/2023    7:42 AM 08/19/2023   11:16 AM 06/28/2023    1:35 PM  Depression screen PHQ 2/9  Decreased Interest 0 0 0  Down, Depressed, Hopeless 0 0 3  PHQ - 2 Score 0 0 3  Altered sleeping 3 2 3   Tired, decreased energy 0 0 3  Change in appetite 3 0 3  Feeling bad or failure about yourself  0 0 3  Trouble concentrating 0 0 3  Moving slowly or fidgety/restless 0 0 0  Suicidal thoughts 0 0 0  PHQ-9 Score 6 2 18   Difficult doing work/chores Extremely dIfficult Somewhat difficult Extremely dIfficult    Quality of Life:  Quality of Life - 09/19/23 0741       Quality of Life   Select Quality of Life      Quality of  Life Scores   Health/Function Pre 26 %    Health/Function Post 23.57 %    Health/Function % Change -9.35 %    Socioeconomic Pre 30 %    Socioeconomic Post 13.5 %    Socioeconomic % Change  -55 %    Psych/Spiritual Pre 30 %    Psych/Spiritual Post 30 %    Psych/Spiritual % Change 0 %    Family Pre 30 %    Family Post 30 %    Family % Change 0 %    GLOBAL Pre 28.29 %    GLOBAL Post 23.27 %    GLOBAL % Change -17.74 %            Nutrition & Weight - Outcomes:  Pre Biometrics - 06/28/23 1341       Pre Biometrics   Height 5\' 10"  (1.778 m)    Weight 69.9 kg    Waist Circumference 35 inches    Hip Circumference 36 inches    Waist to Hip Ratio 0.97 %    BMI (Calculated) 22.11    Triceps Skinfold 5 mm    % Body Fat 17.9 %    Grip Strength 20.6 kg    Flexibility 0 in    Single Leg Stand 0 seconds             Post Biometrics - 09/06/23 1130        Post  Biometrics   Height 5\' 10"  (1.778 m)    Weight 74.7 kg    Waist Circumference 34 inches    Hip Circumference 35 inches    Waist to Hip Ratio 0.97 %    BMI (Calculated) 23.63    Grip Strength 25.2 kg    Single Leg Stand 7 seconds

## 2023-09-20 ENCOUNTER — Encounter (HOSPITAL_COMMUNITY): Payer: Medicaid Other

## 2023-09-23 ENCOUNTER — Encounter (HOSPITAL_COMMUNITY): Payer: Medicaid Other

## 2023-09-25 ENCOUNTER — Encounter (HOSPITAL_COMMUNITY): Payer: Medicaid Other

## 2023-09-27 ENCOUNTER — Encounter (HOSPITAL_COMMUNITY): Payer: Medicaid Other

## 2023-09-30 ENCOUNTER — Encounter (HOSPITAL_COMMUNITY): Payer: Medicaid Other

## 2023-10-02 ENCOUNTER — Encounter (HOSPITAL_COMMUNITY): Payer: Medicaid Other

## 2023-10-04 ENCOUNTER — Encounter (HOSPITAL_COMMUNITY): Payer: Medicaid Other

## 2023-10-07 ENCOUNTER — Encounter (HOSPITAL_COMMUNITY): Payer: Medicaid Other

## 2023-10-09 ENCOUNTER — Encounter (HOSPITAL_COMMUNITY): Payer: Medicaid Other

## 2023-10-11 ENCOUNTER — Encounter (HOSPITAL_COMMUNITY): Payer: Medicaid Other

## 2024-02-25 ENCOUNTER — Encounter: Payer: Self-pay | Admitting: *Deleted

## 2024-02-26 ENCOUNTER — Ambulatory Visit: Payer: Medicaid Other | Attending: Cardiology | Admitting: Cardiology

## 2024-02-26 NOTE — Progress Notes (Deleted)
 Clinical Summary Caleb Perkins is a 48 y.o.male   1.CAD - 04/2023 presented with HTN emergency, HF, NSTEMI - 04/2023 cath: LM normal, ostial LAD 95% and mid 85%, D1 and D2 50%, LCX mid to distal 75%, OM1 60%, RCA occluded CTO 04/2023 CABG: LIMA-LAD, SVG sequentional to OM1 and OM2, SVG-diag, left radial to PDA. Mean PA 14, PCWP 9, CI 3.6    2. HFimpEF/ICM 04/2023 echo: LVEF 35-40%, grade II dd, normal RV function - LVEF normalized following CABG and with medical therapy.  - 08/2023 echo: LVEF 60-65% -   3.HTN - admit 04/2023 with HTN emegergency, symptoms of HF   4.Postop afib - occurred after CABG 04/2023 - was on amio, discontinued. Has been on anticoag.     Past Medical History:  Diagnosis Date   Blood transfusion without reported diagnosis    Hypertension      Allergies  Allergen Reactions   Mite (D. Farinae) Hives and Itching     Current Outpatient Medications  Medication Sig Dispense Refill   albuterol (VENTOLIN HFA) 108 (90 Base) MCG/ACT inhaler Inhale 2 puffs into the lungs every 6 (six) hours as needed for wheezing or shortness of breath. 18 g 2   apixaban (ELIQUIS) 5 MG TABS tablet Take 1 tablet (5 mg total) by mouth 2 (two) times daily. (Patient taking differently: Take 5 mg by mouth 2 (two) times daily. Take 1 tablet (5 mg) by mouth 2 (two) times daily. Total of 10 mg daily) 60 tablet 11   aspirin EC 81 MG tablet Take 1 tablet (81 mg total) by mouth daily. Swallow whole. 30 tablet 11   atorvastatin (LIPITOR) 80 MG tablet Take 1 tablet (80 mg total) by mouth daily. 30 tablet 6   carvedilol (COREG) 12.5 MG tablet Take 1 tablet (12.5 mg total) by mouth 2 (two) times daily with a meal. 60 tablet 6   dapagliflozin propanediol (FARXIGA) 10 MG TABS tablet Take 1 tablet (10 mg total) by mouth daily. 30 tablet 11   Fluticasone-Salmeterol (ADVAIR DISKUS) 250-50 MCG/DOSE AEPB Inhale 1 puff into the lungs 2 (two) times daily. (Patient taking differently: Inhale 1 puff  into the lungs as needed.) 60 each 2   furosemide (LASIX) 40 MG tablet Take 1 tablet (40 mg total) by mouth daily as needed for edema (Weight gain of 3lbs or more in 24 hours or 5lbs or more in 48 hours). 30 tablet 11   isosorbide mononitrate (IMDUR) 30 MG 24 hr tablet Take 1 tablet (30 mg total) by mouth daily. 30 tablet 11   potassium chloride SA (KLOR-CON M) 20 MEQ tablet Take 1 tablet (20 mEq total) by mouth daily as needed (When you take Lasix). 30 tablet 5   sacubitril-valsartan (ENTRESTO) 49-51 MG Take 1 tablet by mouth 2 (two) times daily. 60 tablet 11   spironolactone (ALDACTONE) 25 MG tablet Take 0.5 tablets (12.5 mg total) by mouth daily. 15 tablet 11   No current facility-administered medications for this visit.     Past Surgical History:  Procedure Laterality Date   CORONARY ARTERY BYPASS GRAFT N/A 04/25/2023   Procedure: CORONARY ARTERY BYPASS GRAFTING (CABG) X 5 USING LEFT INTERNAL MAMMARY ARTERY AND ENDOSCOPICALLY HARVESTED RIGHT GREATER SAPHENOUS VEIN;  Surgeon: Loreli Slot, MD;  Location: [facility] OR;  Service: Open Heart Surgery;  Laterality: N/A;   RADIAL ARTERY HARVEST Left 04/25/2023   Procedure: RADIAL ARTERY HARVEST;  Surgeon: Loreli Slot, MD;  Location: [facility] OR;  Service:  Open Heart Surgery;  Laterality: Left;   RIGHT/LEFT HEART CATH AND CORONARY ANGIOGRAPHY N/A 04/22/2023   Procedure: RIGHT/LEFT HEART CATH AND CORONARY ANGIOGRAPHY;  Surgeon: Yvonne Kendall, MD;  Location: [facility] INVASIVE CV LAB;  Service: Cardiovascular;  Laterality: N/A;   TEE WITHOUT CARDIOVERSION N/A 04/25/2023   Procedure: TRANSESOPHAGEAL ECHOCARDIOGRAM;  Surgeon: Loreli Slot, MD;  Location: [facility] OR;  Service: Open Heart Surgery;  Laterality: N/A;     Allergies  Allergen Reactions   Mite (D. Farinae) Hives and Itching      Family History  Problem Relation Age of Onset   Hypertension Mother      Social History Mr. Reamy reports that he quit smoking about 10 months ago.  His smoking use included cigarettes. He has never used smokeless tobacco. Mr. Taaffe reports that he does not currently use alcohol.   Review of Systems CONSTITUTIONAL: No weight loss, fever, chills, weakness or fatigue.  HEENT: Eyes: No visual loss, blurred vision, double vision or yellow sclerae.No hearing loss, sneezing, congestion, runny nose or sore throat.  SKIN: No rash or itching.  CARDIOVASCULAR:  RESPIRATORY: No shortness of breath, cough or sputum.  GASTROINTESTINAL: No anorexia, nausea, vomiting or diarrhea. No abdominal pain or blood.  GENITOURINARY: No burning on urination, no polyuria NEUROLOGICAL: No headache, dizziness, syncope, paralysis, ataxia, numbness or tingling in the extremities. No change in bowel or bladder control.  MUSCULOSKELETAL: No muscle, back pain, joint pain or stiffness.  LYMPHATICS: No enlarged nodes. No history of splenectomy.  PSYCHIATRIC: No history of depression or anxiety.  ENDOCRINOLOGIC: No reports of sweating, cold or heat intolerance. No polyuria or polydipsia.  Marland Kitchen   Physical Examination There were no vitals filed for this visit. There were no vitals filed for this visit.  Gen: resting comfortably, no acute distress HEENT: no scleral icterus, pupils equal round and reactive, no palptable cervical adenopathy,  CV Resp: Clear to auscultation bilaterally GI: abdomen is soft, non-tender, non-distended, normal bowel sounds, no hepatosplenomegaly MSK: extremities are warm, no edema.  Skin: warm, no rash Neuro:  no focal deficits Psych: appropriate affect   Diagnostic Studies     Assessment and Plan        Antoine Poche, M.D., F.A.C.C.

## 2024-02-27 ENCOUNTER — Encounter: Payer: Self-pay | Admitting: Nurse Practitioner

## 2024-05-01 ENCOUNTER — Other Ambulatory Visit (HOSPITAL_COMMUNITY): Payer: Self-pay

## 2024-05-01 ENCOUNTER — Telehealth (HOSPITAL_COMMUNITY): Payer: Self-pay | Admitting: Pharmacy Technician

## 2024-05-01 NOTE — Telephone Encounter (Signed)
 Patient Advocate Encounter   Received notification from [employer] that prior authorization for Farxiga  is required.   PA submitted on CoverMyMeds Key [id number] Status is pending   Will continue to follow.

## 2024-05-04 NOTE — Telephone Encounter (Signed)
 Advanced Heart Failure Patient Advocate Encounter  Prior Authorization for Farxiga  has been approved.    PA# [id number] Effective dates: 05/01/24 through 05/01/25  Correne Dillon, CPhT

## 2024-05-05 ENCOUNTER — Encounter: Payer: Self-pay | Admitting: Nurse Practitioner

## 2024-05-05 ENCOUNTER — Ambulatory Visit: Attending: Nurse Practitioner | Admitting: Nurse Practitioner

## 2024-05-05 VITALS — BP 118/84 | HR 77 | Ht 70.0 in | Wt 172.2 lb

## 2024-05-05 DIAGNOSIS — I10 Essential (primary) hypertension: Secondary | ICD-10-CM | POA: Diagnosis present

## 2024-05-05 DIAGNOSIS — I5033 Acute on chronic diastolic (congestive) heart failure: Secondary | ICD-10-CM | POA: Diagnosis present

## 2024-05-05 DIAGNOSIS — I5032 Chronic diastolic (congestive) heart failure: Secondary | ICD-10-CM

## 2024-05-05 DIAGNOSIS — Z79899 Other long term (current) drug therapy: Secondary | ICD-10-CM | POA: Diagnosis present

## 2024-05-05 DIAGNOSIS — R42 Dizziness and giddiness: Secondary | ICD-10-CM | POA: Diagnosis present

## 2024-05-05 DIAGNOSIS — J449 Chronic obstructive pulmonary disease, unspecified: Secondary | ICD-10-CM | POA: Diagnosis present

## 2024-05-05 DIAGNOSIS — I255 Ischemic cardiomyopathy: Secondary | ICD-10-CM

## 2024-05-05 DIAGNOSIS — I4891 Unspecified atrial fibrillation: Secondary | ICD-10-CM | POA: Insufficient documentation

## 2024-05-05 DIAGNOSIS — Z951 Presence of aortocoronary bypass graft: Secondary | ICD-10-CM | POA: Diagnosis present

## 2024-05-05 DIAGNOSIS — I2510 Atherosclerotic heart disease of native coronary artery without angina pectoris: Secondary | ICD-10-CM | POA: Insufficient documentation

## 2024-05-05 DIAGNOSIS — I9789 Other postprocedural complications and disorders of the circulatory system, not elsewhere classified: Secondary | ICD-10-CM | POA: Insufficient documentation

## 2024-05-05 DIAGNOSIS — E785 Hyperlipidemia, unspecified: Secondary | ICD-10-CM | POA: Diagnosis present

## 2024-05-05 MED ORDER — POTASSIUM CHLORIDE CRYS ER 20 MEQ PO TBCR: 20.0000 meq | EXTENDED_RELEASE_TABLET | Freq: Every day | ORAL | 5 refills | Status: AC

## 2024-05-05 MED ORDER — CARVEDILOL 6.25 MG PO TABS: 6.2500 mg | ORAL_TABLET | Freq: Two times a day (BID) | ORAL | 1 refills | Status: DC

## 2024-05-05 MED ORDER — EZETIMIBE 10 MG PO TABS: 10.0000 mg | ORAL_TABLET | Freq: Every day | ORAL | 1 refills | Status: DC

## 2024-05-05 MED ORDER — FUROSEMIDE 40 MG PO TABS: 40.0000 mg | ORAL_TABLET | Freq: Every day | ORAL | 3 refills | Status: AC

## 2024-05-05 NOTE — Patient Instructions (Addendum)
 Medication Instructions:  Your physician has recommended you make the following change in your medication:  Please Increase your Lasix  to 80 Mg Daily for 5 days, then return to 40 Mg daily  Please Increase your Potassium to 40 MeQ daily for 5 days, then return to 20 MeQ daily  Please start Zetia 10 Mg daily  Please reduce Coreg  to 6.25 Twice daily   Labwork: [facility]   Testing/Procedures: None   Follow-Up: Your physician recommends that you schedule a follow-up appointment in: 2-3 months   Any Other Special Instructions Will Be Listed Below (If Applicable).  If you need a refill on your cardiac medications before your next appointment, please call your pharmacy.

## 2024-05-05 NOTE — Progress Notes (Signed)
 Office Visit    Patient Name: Caleb Perkins Date of Encounter: 05/05/2024 PCP:  Bryna Car [facility]  Cardiologist:  Armida Lander, MD  Advanced Practice Provider:  Lasalle Pointer, NP Electrophysiologist:  None   Chief Complaint and HPI    Caleb Perkins is a 48 y.o. male with a hx of CAD, s/p NSTEMI and CABG, hypertension, HLD, tobacco abuse, marijuana abuse, ischemic cardiomyopathy, PAF, COPD, ascending thoracic aortic aneurysm, who presents today for overdue follow-up.   [facility]ed to [name] with worsening shortness of breath and chest pain on April 19, 2023.  Also noted some intermittent ankle edema, denies syncope.  Was found to have hypertensive emergency with blood pressure 228/156.  Troponin maximum 102, BNP 495.  Received IV labetalol .  Cardiology was consulted.  CTA negative for aortic dissection, no noted to have mild dilation of ascending aorta measuring 4.1 cm and intralobular septal thickening in both lungs, also noted to have small right pleural effusion with patchy groundglass opacities in right upper and lower lobes.  Echocardiogram revealed EF 35 to 40%, LV with global hypokinesis, mild AI, MR, and TR, and aortic dilatation of 40 mm.  Underwent cardiac catheterization that revealed 95% stenosis along the ostial to proximal LAD, mid LAD with 85% stenosis, 75% stenosis along mid to distal circumflex, and proximal to distal RCA with 100% stenosis.  Then underwent CABG x 5 utilizing SVG sequential to OM1 and OM 2, LIMA to LAD, SVG to diagonal, and radial artery to PDA.  Tolerated procedure well.  Developed A-fib on postop day 3, started on oral amiodarone  loading, also started on Entresto  and Farxiga  for his systolic heart failure.  Remained in sinus rhythm and had bursts of A-fib with RVR and NSVT.  Converted later on to normal sinus rhythm with bolus of amiodarone , oral amiodarone  was continued.  Started on Eliquis  for stroke  prevention.  I last saw patient on 08/15/2023. Was doing well at the time overall. Did admit to some dizziness occasionally with prolonged hot showers, denied any orthostatic symptoms.   He presents for follow-up with his mother.  Shows me a copy of his most recent lab report that shows LDL-69, CBC-WNL, creatinine 1.28 with eGFR 70.  Rest of CMET normal.  A1c was 5.8%. Admits to weight gain, swelling. Denies any chest pain, shortness of breath, palpitations, syncope, presyncope, orthopnea, PND, acute bleeding, or claudication. Does admit to rare episodes of dizziness when bending over and standing up.   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 in [place]  EKGs/Labs/Other Studies Reviewed:   The following studies were reviewed today:  EKG: EKG Interpretation Date/Time:  Tuesday May 05 2024 14:52:38 EDT Ventricular Rate:  77 PR Interval:  152 QRS Duration:  104 QT Interval:  402 QTC Calculation: 454 R Axis:   39  Text Interpretation: Normal sinus rhythm Possible Inferior infarct (cited on or before 29-Apr-2023) When compared with ECG of 20-May-2023 12:10, T wave inversion no longer evident in Anterolateral leads QT has lengthened Confirmed by Lasalle Pointer [phone]) on 05/05/2024 2:59:25 PM   Echo 08/2023:  1. Left ventricular ejection fraction, by estimation, is 60 to 65% with  apical hypokinesis. The left ventricle has normal function. Left  ventricular diastolic parameters were normal.   2. Right ventricular systolic function is normal. The right ventricular  size is normal. There is normal pulmonary artery systolic pressure.   3. Left  atrial size was mildly dilated.   4. Right atrial size was mildly dilated.   5. The mitral valve is normal in structure. Trivial mitral valve  regurgitation. No evidence of mitral stenosis.   6. The aortic valve was not well visualized. Aortic valve regurgitation  is not visualized. No aortic stenosis is present.    7. Aortic dilatation noted. There is borderline dilatation of the aortic  root, measuring 39 mm.   8. The inferior vena cava is normal in size with <50% respiratory  variability, suggesting right atrial pressure of 8 mmHg.   Comparison(s): Changes from prior study are noted. LVEF improved from  35-40% in 04/2023 to 60-65% with apical hypokinesis now.  TEE Post-op 04/25/2023:  Complications: No known complications during this procedure.  POST-OP IMPRESSIONS  _ Left Ventricle: LVEF marginally improved, 40-45%. Some improved wall  motion on  the septum.  _ Right Ventricle: The right ventricle appears unchanged from pre-bypass.  _ Aorta: The aorta appears unchanged from pre-bypass.  _ Left Atrial Appendage: The left atrial appendage appears unchanged from  pre-bypass.  _ Mitral Valve: The mitral valve appears unchanged from pre-bypass.  _ Tricuspid Valve: The tricuspid valve appears unchanged from pre-bypass.  _ Pulmonic Valve: The pulmonic valve appears unchanged from pre-bypass.  _ Interatrial Septum: The interatrial septum appears unchanged from  pre-bypass.   PRE-OP FINDINGS   Left Ventricle: The left ventricle has moderately reduced systolic  function, with an ejection fraction of 35-40%. The cavity size was mildly  dilated. There is moderate eccentric left ventricular hypertrophy.  Doppler 04/2023:  Summary:  Right Carotid: Velocities in the right ICA are consistent with a 1-39%  stenosis.   Left Carotid: Velocities in the left ICA are consistent with a 1-39%  stenosis.  Vertebrals: Bilateral vertebral arteries demonstrate antegrade flow.  Subclavians: Normal flow hemodynamics were seen in bilateral subclavian               arteries.   Right ABI: Triphasic PTA, multiphasic DPA.  Left ABI: Triphasic PTA and DPA.    Right Upper Extremity: Doppler waveforms remain within normal limits with  right radial compression. Doppler waveforms remain within normal limits  with  right ulnar compression.  Left Upper Extremity: Doppler waveforms remain within normal limits with  left radial compression. Doppler waveforms remain within normal limits  with left ulnar compression.     EKG:  EKG is not ordered today.    Right and left heart cath 04/2023: Conclusions: Severe three-vessel coronary artery disease, as detailed below, including sequential 95% ostial and 80-90% mid LAD stenoses, 60% OM1 lesion, 70-80% mid/distal LCx disease, and chronic total occlusion of proximal/mid RCA with left-to-right and right-to-right collaterals. Moderately reduced left ventricular systolic function (LVEF 35-45%). Normal left heart, right heart, and pulmonary artery pressures. Normal Fick cardiac output/index.   Recommendations: Gentle post-catheterization hydration; aim for net even fluid balance. Escalate goal directed medical therapy for acute HFrEF; I will add Entresto  24-26 mg BID beginning this evening. Cardiac surgery consultation for CABG. Aggressive secondary prevention of coronary artery disease. Start heparin  infusion 2 hours after TR band removal in the setting of severe multivessel CAD and elevated troponin this admission.  Echo 04/2023: 1. Left ventricular ejection fraction, by estimation, is 35 to 40%. The  left ventricle has moderately decreased function. The left ventricle  demonstrates global hypokinesis. There is moderate left ventricular  hypertrophy. Left ventricular diastolic  parameters are consistent with Grade II diastolic dysfunction  (pseudonormalization). Elevated left  atrial pressure.   2. Right ventricular systolic function is normal. The right ventricular  size is normal. There is severely elevated pulmonary artery systolic  pressure.   3. Left atrial size was mildly dilated.   4. Right atrial size was mildly dilated.   5. The mitral valve is abnormal. Mild mitral valve regurgitation. No  evidence of mitral stenosis.   6. The tricuspid valve is  abnormal.   7. The aortic valve is tricuspid. Aortic valve regurgitation is mild. No  aortic stenosis is present.   8. Aortic dilatation noted. There is mild dilatation of the ascending  aorta, measuring 40 mm.   9. The inferior vena cava is dilated in size with <50% respiratory  variability, suggesting right atrial pressure of 15 mmHg.  Risk Assessment/Calculations:   CHA2DS2-VASc Score = 3  This indicates a 3.2% annual risk of stroke. The patient's score is based upon: CHF History: 1 HTN History: 1 Diabetes History: 0 Stroke History: 0 Vascular Disease History: 1 Age Score: 0 Gender Score: 0   Review of Systems    All other systems reviewed and are otherwise negative except as noted above.  Physical Exam    VS:  BP 118/84   Pulse 77   Ht 5\' 10"  (1.778 m)   Wt 172 lb 3.2 oz (78.1 kg)   SpO2 96%   BMI 24.71 kg/m  , BMI Body mass index is 24.71 kg/m.  Wt Readings from Last 3 Encounters:  05/05/24 172 lb 3.2 oz (78.1 kg)  09/06/23 164 lb 11.2 oz (74.7 kg)  08/15/23 160 lb 9.6 oz (72.8 kg)     GEN: 48 y.o. male in no acute distress. HEENT: normal. Neck: Supple, no JVD, carotid bruits, or masses. Cardiac: S1/S2, RRR, no murmurs, rubs, or gallops. No clubbing, cyanosis. Does have some nonpitting edema to BLE. Radials/PT 2+ and equal bilaterally.  Respiratory:  Respirations regular and unlabored, clear to auscultation bilaterally. MS: No deformity or atrophy. Skin: Pale appearance, warm and dry, no rash.  Neuro:  Strength and sensation are intact. Psych: Normal affect.  Assessment & Plan    Acute on chronic HFpEF, ICM Stage C, NYHA class I-II symptoms. EF 08/2023 was 60-65%. His weight is up almost 10 lbs since I last saw him with a total of 28 lbs since I saw him 1 year ago. Admits to peripheral edema.  Will reduce carvedilol  to 6.25 mg twice daily.  Will increase Lasix  to 40 mg BID x 5 days, then return to 40 mg daily. Will have him double his potassium dosage for 5  days, then return to 20 mEq daily.  Will obtain BMET, Mag, and proBNP in 1-2 weeks. Continue rest of GDMT. Low sodium diet, fluid restriction <2L, and daily weights encouraged. Educated to contact our office for weight gain of 2 lbs overnight or 5 lbs in one week.  CAD, s/p NSTEMI and CABG x 5 (04/2023), medication management Stable with no anginal symptoms. Doing very well.  Will reduce carvedilol  to 6.25 mg twice daily and other medication changes as noted above.  Starting Zetia  10 mg daily.  Continue current medication regimen. Heart healthy diet and regular cardiovascular exercise encouraged. Congratulated him that he has quit smoking nicotine .   3. Post-op A-fib Had post-op A-fib after CABG. Continue Eliquis . Will reduce Coreg  to 6.25 mg BID. Currently on appropriate dosage and denies any bleeding issues. Continue to follow with PCP.   4. HTN, dizziness BP stable. Orthostatics negative today. Continue current  medication regimen. Discussed to monitor BP at home at least 2 hours after medications and sitting for 5-10 minutes. Heart healthy diet and regular cardiovascular exercise encouraged.  Will reduce carvedilol  to 6.25 mg twice daily to help improve his symptoms of dizziness.  Discussed conservative measures and he verbalized understanding.  Care needed precautions discussed.  5. HLD Most recent LDL that was obtained by PCP was 69.  LDL goal is less than 55.  Continue atorvastatin  and will begin Zetia  10 mg daily. Will obtain FLP and LFT at next office visit. Heart healthy diet and regular cardiovascular exercise encouraged.   7. COPD Denies any SHOB or acute exacerbation. No medication changes. Continue to follow with PCP.  Follow up in 2-3 months with Armida Lander, MD or APP.  Signed, Lasalle Pointer, NP

## 2024-05-15 ENCOUNTER — Other Ambulatory Visit (HOSPITAL_COMMUNITY)
Admission: RE | Admit: 2024-05-15 | Discharge: 2024-05-15 | Disposition: A | Discharge status: Home / Self Care | Source: Ambulatory Visit | Attending: Nurse Practitioner | Admitting: Nurse Practitioner

## 2024-05-15 DIAGNOSIS — Z79899 Other long term (current) drug therapy: Secondary | ICD-10-CM | POA: Diagnosis present

## 2024-05-15 DIAGNOSIS — I5033 Acute on chronic diastolic (congestive) heart failure: Secondary | ICD-10-CM | POA: Diagnosis present

## 2024-05-15 DIAGNOSIS — E785 Hyperlipidemia, unspecified: Secondary | ICD-10-CM | POA: Diagnosis present

## 2024-05-15 LAB — LIPID PANEL
Cholesterol: 109 mg/dL (ref 0–200)
HDL: 27 mg/dL — ABNORMAL LOW (ref >40)
LDL Cholesterol: 57 mg/dL (ref 0–99)
Total CHOL/HDL Ratio: 4 ratio
Triglycerides: 127 mg/dL (ref <150)
VLDL: 25 mg/dL (ref 0–40)

## 2024-05-15 LAB — HEPATIC FUNCTION PANEL
ALT: 20 U/L (ref 0–44)
AST: 21 U/L (ref 15–41)
Albumin: 4.2 g/dL (ref 3.5–5.0)
Alkaline Phosphatase: 65 U/L (ref 38–126)
Bilirubin, Direct: 0.2 mg/dL (ref 0.0–0.2)
Indirect Bilirubin: 1.4 mg/dL — ABNORMAL HIGH (ref 0.3–0.9)
Total Bilirubin: 1.6 mg/dL — ABNORMAL HIGH (ref 0.0–1.2)
Total Protein: 7.5 g/dL (ref 6.5–8.1)

## 2024-05-15 LAB — BASIC METABOLIC PANEL WITH GFR
Anion gap: 11 (ref 5–15)
BUN: 33 mg/dL — ABNORMAL HIGH (ref 6–20)
CO2: 21 mmol/L — ABNORMAL LOW (ref 22–32)
Calcium: 9.4 mg/dL (ref 8.9–10.3)
Chloride: 102 mmol/L (ref 98–111)
Creatinine, Ser: 1.17 mg/dL (ref 0.61–1.24)
GFR, Estimated: >60 mL/min (ref >60)
Glucose, Bld: 110 mg/dL — ABNORMAL HIGH (ref 70–99)
Potassium: 3.7 mmol/L (ref 3.5–5.1)
Sodium: 134 mmol/L — ABNORMAL LOW (ref 135–145)

## 2024-05-15 LAB — BRAIN NATRIURETIC PEPTIDE: B Natriuretic Peptide: 29 pg/mL (ref 0.0–100.0)

## 2024-05-15 LAB — MAGNESIUM: Magnesium: 2.2 mg/dL (ref 1.7–2.4)

## 2024-06-03 ENCOUNTER — Ambulatory Visit: Payer: Self-pay | Admitting: Nurse Practitioner

## 2024-06-18 ENCOUNTER — Other Ambulatory Visit (HOSPITAL_COMMUNITY): Payer: Self-pay | Admitting: Adult Health

## 2024-06-23 NOTE — Telephone Encounter (Signed)
 Prescription refill request for Eliquis  received. Indication: PAF Last office visit: 05/05/24  FORBES Crate NP Scr: 1.17 on 05/15/24  Epic Age: 48 Weight: 78.1kg  Based on above findings Eliquis  5mg  twice daily is the appropriate dose.  Refill approved.

## 2024-07-21 ENCOUNTER — Encounter: Payer: Self-pay | Admitting: Nurse Practitioner

## 2024-07-21 ENCOUNTER — Ambulatory Visit: Attending: Nurse Practitioner | Admitting: Nurse Practitioner

## 2024-07-21 ENCOUNTER — Other Ambulatory Visit: Payer: Self-pay | Admitting: Nurse Practitioner

## 2024-07-21 ENCOUNTER — Telehealth: Payer: Self-pay | Admitting: Nurse Practitioner

## 2024-07-21 ENCOUNTER — Other Ambulatory Visit

## 2024-07-21 VITALS — BP 114/88 | HR 80 | Ht 69.0 in | Wt 163.2 lb

## 2024-07-21 DIAGNOSIS — I5032 Chronic diastolic (congestive) heart failure: Secondary | ICD-10-CM | POA: Diagnosis present

## 2024-07-21 DIAGNOSIS — E785 Hyperlipidemia, unspecified: Secondary | ICD-10-CM | POA: Diagnosis present

## 2024-07-21 DIAGNOSIS — J449 Chronic obstructive pulmonary disease, unspecified: Secondary | ICD-10-CM | POA: Diagnosis present

## 2024-07-21 DIAGNOSIS — I4891 Unspecified atrial fibrillation: Secondary | ICD-10-CM | POA: Insufficient documentation

## 2024-07-21 DIAGNOSIS — I10 Essential (primary) hypertension: Secondary | ICD-10-CM | POA: Insufficient documentation

## 2024-07-21 DIAGNOSIS — R42 Dizziness and giddiness: Secondary | ICD-10-CM | POA: Diagnosis present

## 2024-07-21 DIAGNOSIS — I2510 Atherosclerotic heart disease of native coronary artery without angina pectoris: Secondary | ICD-10-CM | POA: Diagnosis present

## 2024-07-21 DIAGNOSIS — R55 Syncope and collapse: Secondary | ICD-10-CM

## 2024-07-21 DIAGNOSIS — I9789 Other postprocedural complications and disorders of the circulatory system, not elsewhere classified: Secondary | ICD-10-CM | POA: Insufficient documentation

## 2024-07-21 DIAGNOSIS — E782 Mixed hyperlipidemia: Secondary | ICD-10-CM

## 2024-07-21 NOTE — Progress Notes (Unsigned)
 Office Visit    Patient Name: Caleb Perkins Date of Encounter: 07/21/2024 PCP:  [facility]  Cardiologist:  Caleb Carrier, MD  Advanced Practice Provider:  Miriam Norris, NP Electrophysiologist:  None   Chief Complaint and HPI    Caleb Perkins is a 48 y.o. male with a hx of CAD, s/p NSTEMI and CABG, hypertension, HLD, tobacco abuse, marijuana abuse, ischemic cardiomyopathy, PAF, COPD, ascending thoracic aortic aneurysm, who presents today for overdue follow-up.   Presented to [facility] with worsening shortness of breath and chest pain on April 19, 2023.  Also noted some intermittent ankle edema, denies syncope.  Was found to have hypertensive emergency with blood pressure 228/156.  Troponin maximum 102, BNP 495.  Received IV labetalol .  Cardiology was consulted.  CTA negative for aortic dissection, no noted to have mild dilation of ascending aorta measuring 4.1 cm and intralobular septal thickening in both lungs, also noted to have small right pleural effusion with patchy groundglass opacities in right upper and lower lobes.  Echocardiogram revealed EF 35 to 40%, LV with global hypokinesis, mild AI, MR, and TR, and aortic dilatation of 40 mm.  Underwent cardiac catheterization that revealed 95% stenosis along the ostial to proximal LAD, mid LAD with 85% stenosis, 75% stenosis along mid to distal circumflex, and proximal to distal RCA with 100% stenosis.  Then underwent CABG x 5 utilizing SVG sequential to OM1 and OM 2, LIMA to LAD, SVG to diagonal, and radial artery to PDA.  Tolerated procedure well.  Developed A-fib on postop day 3, started on oral amiodarone  loading, also started on Entresto  and Farxiga  for his systolic heart failure.  Remained in sinus rhythm and had bursts of A-fib with RVR and NSVT.  Converted later on to normal sinus rhythm with bolus of amiodarone , oral amiodarone  was continued.  Started on Eliquis  for stroke  prevention.  I last saw patient on 08/15/2023. Was doing well at the time overall. Did admit to some dizziness occasionally with prolonged hot showers, denied any orthostatic symptoms.   05/05/2024 - He presents for follow-up with his mother.  Shows me a copy of his most recent lab report that shows LDL-69, CBC-WNL, creatinine 1.28 with eGFR 70.  Rest of CMET normal.  A1c was 5.8%. Admits to weight gain, swelling. Denies any chest pain, shortness of breath, palpitations, syncope, presyncope, orthopnea, PND, acute bleeding, or claudication. Does admit to rare episodes of dizziness when bending over and standing up.   07/21/2024 -  Here for follow-up. Admits to continued episodes of dizziness, also states he has near syncopal episodes with this. No real difference in symptoms since last office visit. Weight has reduced. Denies any chest pain, shortness of breath, palpitations, syncope, orthopnea, PND, swelling or significant weight changes, acute bleeding, or claudication.  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      Echo 08/2023:  1. Left ventricular ejection fraction, by estimation, is 60 to 65% with  apical hypokinesis. The left ventricle has normal function. Left  ventricular diastolic parameters were normal.   2. Right ventricular systolic function is normal. The right ventricular  size is normal. There is normal pulmonary artery systolic pressure.   3. Left atrial size was mildly dilated.   4. Right atrial size was  mildly dilated.   5. The mitral valve is normal in structure. Trivial mitral valve  regurgitation. No evidence of mitral stenosis.   6. The aortic valve was not well visualized. Aortic valve regurgitation  is not visualized. No aortic stenosis is present.   7. Aortic dilatation noted. There is borderline dilatation  of the aortic  root, measuring 39 mm.   8. The inferior vena cava is normal in size with <50% respiratory  variability, suggesting right atrial pressure of 8 mmHg.   Comparison(s): Changes from prior study are noted. LVEF improved from  35-40% in 04/2023 to 60-65% with apical hypokinesis now.  TEE Post-op 04/25/2023:  Complications: No known complications during this procedure.  POST-OP IMPRESSIONS  _ Left Ventricle: LVEF marginally improved, 40-45%. Some improved wall  motion on  the septum.  _ Right Ventricle: The right ventricle appears unchanged from pre-bypass.  _ Aorta: The aorta appears unchanged from pre-bypass.  _ Left Atrial Appendage: The left atrial appendage appears unchanged from  pre-bypass.  _ Mitral Valve: The mitral valve appears unchanged from pre-bypass.  _ Tricuspid Valve: The tricuspid valve appears unchanged from pre-bypass.  _ Pulmonic Valve: The pulmonic valve appears unchanged from pre-bypass.  _ Interatrial Septum: The interatrial septum appears unchanged from  pre-bypass.   PRE-OP FINDINGS   Left Ventricle: The left ventricle has moderately reduced systolic  function, with an ejection fraction of 35-40%. The cavity size was mildly  dilated. There is moderate eccentric left ventricular hypertrophy.  Doppler 04/2023:  Summary:  Right Carotid: Velocities in the right ICA are consistent with a 1-39%  stenosis.   Left Carotid: Velocities in the left ICA are consistent with a 1-39%  stenosis.  Vertebrals: Bilateral vertebral arteries demonstrate antegrade flow.  Subclavians: Normal flow hemodynamics were seen in bilateral subclavian               arteries.   Right ABI: Triphasic PTA, multiphasic DPA.  Left ABI: Triphasic PTA and DPA.    Right Upper Extremity: Doppler waveforms remain within normal limits with  right radial compression. Doppler waveforms remain within normal limits  with right ulnar compression.  Left Upper Extremity: Doppler waveforms  remain within normal limits with  left radial compression. Doppler waveforms remain within normal limits  with left ulnar compression.     EKG:  EKG is not ordered today.    Right and left heart cath 04/2023: Conclusions: Severe three-vessel coronary artery disease, as detailed below, including sequential 95% ostial and 80-90% mid LAD stenoses, 60% OM1 lesion, 70-80% mid/distal LCx disease, and chronic total occlusion of proximal/mid RCA with left-to-right and right-to-right collaterals. Moderately reduced left ventricular systolic function (LVEF 35-45%). Normal left heart, right heart, and pulmonary artery pressures. Normal Fick cardiac output/index.   Recommendations: Gentle post-catheterization hydration; aim for net even fluid balance. Escalate goal directed medical therapy for acute HFrEF; I will add Entresto  24-26 mg BID beginning this evening. Cardiac surgery consultation for CABG. Aggressive secondary prevention of coronary artery disease. Start heparin  infusion 2 hours after TR band removal in the setting of severe multivessel CAD and elevated troponin this admission.  Echo 04/2023: 1. Left ventricular ejection fraction, by estimation, is 35 to 40%. The  left ventricle has moderately decreased function. The left ventricle  demonstrates global hypokinesis. There is moderate left ventricular  hypertrophy. Left ventricular diastolic  parameters are consistent with Grade II diastolic dysfunction  (pseudonormalization). Elevated left atrial pressure.   2. Right ventricular systolic function is normal. The  right ventricular  size is normal. There is severely elevated pulmonary artery systolic  pressure.   3. Left atrial size was mildly dilated.   4. Right atrial size was mildly dilated.   5. The mitral valve is abnormal. Mild mitral valve regurgitation. No  evidence of mitral stenosis.   6. The tricuspid valve is abnormal.   7. The aortic valve is tricuspid. Aortic valve  regurgitation is mild. No  aortic stenosis is present.   8. Aortic dilatation noted. There is mild dilatation of the ascending  aorta, measuring 40 mm.   9. The inferior vena cava is dilated in size with <50% respiratory  variability, suggesting right atrial pressure of 15 mmHg.   Review of Systems    All other systems reviewed and are otherwise negative except as noted above.  Physical Exam    VS:  BP 114/88 (BP Location: Left Arm)   Pulse 80   Ht 5' 9 (1.753 m)   Wt 163 lb 3.2 oz (74 kg)   SpO2 98%   BMI 24.10 kg/m  , BMI Body mass index is 24.1 kg/m.  Wt Readings from Last 3 Encounters:  07/21/24 163 lb 3.2 oz (74 kg)  05/05/24 172 lb 3.2 oz (78.1 kg)  09/06/23 164 lb 11.2 oz (74.7 kg)     GEN: 48 y.o. male in no acute distress. HEENT: normal. Neck: Supple, no JVD, carotid bruits, or masses. Cardiac: S1/S2, RRR, no murmurs, rubs, or gallops. No clubbing, cyanosis. No edema to BLE. Radials/PT 2+ and equal bilaterally.  Respiratory:  Respirations regular and unlabored, clear to auscultation bilaterally. MS: No deformity or atrophy. Skin: Pale appearance, warm and dry, no rash.  Neuro:  Strength and sensation are intact. Psych: Normal affect.  Assessment & Plan    Chronic HFpEF, ICM Stage C, NYHA class I-II symptoms. EF 08/2023 was 60-65%. Euvolemic and well compensated on exam. GDMT limited d/t his dizziness currently. Will d/c Aldactone  - may be over diuresed. Continue rest of GDMT. Low sodium diet, fluid restriction <2L, and daily weights encouraged. Educated to contact our office for weight gain of 2 lbs overnight or 5 lbs in one week.  CAD, s/p NSTEMI and CABG x 5 (04/2023) Stable with no anginal symptoms. Doing very well.   Continue current medication regimen. Heart healthy diet and regular cardiovascular exercise encouraged. Congratulated him that he quit smoking nicotine .   3. Post-op A-fib Had post-op A-fib after CABG. Continue Eliquis . Currently on appropriate  dosage and denies any bleeding issues. Continue current medication regimen. Continue to follow with PCP.   4. HTN, dizziness, near syncope BP stable. Past orthostatics negative. Admits to near syncopal epsiodes. Denies orthostatic symptoms. Will arrange monitor for further evaluation. Stopping Aldactone . No other medication changes at this time. Discussed to monitor BP at home at least 2 hours after medications and sitting for 5-10 minutes. Heart healthy diet and regular cardiovascular exercise encouraged. Discussed conservative measures and he verbalized understanding.  Care and ED precautions discussed. Will obtain CMET and Mag.   5. HLD Most recent LDL that was obtained by PCP was 69.  LDL goal is less than 55.  Continue atorvastatin  and Zetia  10 mg daily. Will obtain FLP and LFT at next office visit. Heart healthy diet and regular cardiovascular exercise encouraged. Will obtain FLP.   7. COPD Denies any SHOB or acute exacerbation. No medication changes. Continue to follow with PCP.   Follow up in 4-6 weeks with Caleb Carrier, MD or APP.  Signed, Almarie Crate, NP

## 2024-07-21 NOTE — Patient Instructions (Addendum)
 Medication Instructions:  Your physician has recommended you make the following change in your medication:  Please stop Aldactone    Labwork: In 1 month at [facility]  Testing/Procedures: GEOFFRY- Long Term Monitor Instructions   Your physician has requested you wear your ZIO patch monitor 14 days.   This is a single patch monitor.  [employer] supplies one patch monitor per enrollment.  Additional stickers are not available.   Please do not apply patch if you will be having a Nuclear Stress Test, Echocardiogram, Cardiac CT, MRI, or Chest Xray during the time frame you would be wearing the monitor. The patch cannot be worn during these tests.  You cannot remove and re-apply the ZIO XT patch monitor.   Your ZIO patch monitor will be sent USPS Priority mail from [facility] directly to your home address. The monitor may also be mailed to a PO BOX if home delivery is not available.   It may take 3-5 days to receive your monitor after you have been enrolled.   Once you have received you monitor, please review enclosed instructions.  Your monitor has already been registered assigning a specific monitor serial # to you.   Applying the monitor   Shave hair from upper left chest.   Hold abrader disc by orange tab.  Rub abrader in 40 strokes over left upper chest as indicated in your monitor instructions.   Clean area with 4 enclosed alcohol pads .  Use all pads to assure are is cleaned thoroughly.  Let dry.   Apply patch as indicated in monitor instructions.  Patch will be place under collarbone on left side of chest with arrow pointing upward.   Rub patch adhesive wings for 2 minutes.Remove white label marked 1.  Remove white label marked 2.  Rub patch adhesive wings for 2 additional minutes.   While looking in a mirror, press and release button in center of patch.  A small green light will flash 3-4 times .  This will be your only indicator the monitor has been turned on.     Do  not shower for the first 24 hours.  You may shower after the first 24 hours.   Press button if you feel a symptom. You will hear a small click.  Record Date, Time and Symptom in the Patient Log Book.   When you are ready to remove patch, follow instructions on last 2 pages of Patient Log Book.  Stick patch monitor onto last page of Patient Log Book.   Place Patient Log Book in [place] box.  Use locking tab on box and tape box closed securely.  The [employer] has [employer] on it.  Please place in mailbox as soon as possible.  Your physician should have your test results approximately 7 days after the monitor has been mailed back to [facility].   Call [facility] Customer Care at [phone] if you have questions regarding your ZIO XT patch monitor.  Call them immediately if you see an orange light blinking on your monitor.   If your monitor falls off in less than 4 days contact our Monitor department at [phone].  If your monitor becomes loose or falls off after 4 days call [employer] at [phone] for suggestions on securing your monitor.  Follow-Up: Your physician recommends that you schedule a follow-up appointment in: 4-6 weeks   Any Other Special Instructions Will Be Listed Below (If Applicable).  If you need a refill on your cardiac  medications before your next appointment, please call your pharmacy.

## 2024-07-21 NOTE — Telephone Encounter (Signed)
 Checking percert on the following   ZIO- Long Term Monitor 14 DAYS

## 2024-08-17 ENCOUNTER — Other Ambulatory Visit: Payer: Self-pay | Admitting: Nurse Practitioner

## 2024-08-21 ENCOUNTER — Other Ambulatory Visit (HOSPITAL_COMMUNITY)
Admission: RE | Admit: 2024-08-21 | Discharge: 2024-08-21 | Disposition: A | Discharge status: Home / Self Care | Source: Ambulatory Visit | Attending: Nurse Practitioner | Admitting: Nurse Practitioner

## 2024-08-21 DIAGNOSIS — R55 Syncope and collapse: Secondary | ICD-10-CM | POA: Diagnosis present

## 2024-08-21 DIAGNOSIS — R42 Dizziness and giddiness: Secondary | ICD-10-CM | POA: Insufficient documentation

## 2024-08-21 DIAGNOSIS — E785 Hyperlipidemia, unspecified: Secondary | ICD-10-CM | POA: Diagnosis present

## 2024-08-21 LAB — COMPREHENSIVE METABOLIC PANEL WITH GFR
ALT: 14 U/L (ref 0–44)
AST: 17 U/L (ref 15–41)
Albumin: 4.4 g/dL (ref 3.5–5.0)
Alkaline Phosphatase: 67 U/L (ref 38–126)
Anion gap: 10 (ref 5–15)
BUN: 22 mg/dL — ABNORMAL HIGH (ref 6–20)
CO2: 27 mmol/L (ref 22–32)
Calcium: 9.4 mg/dL (ref 8.9–10.3)
Chloride: 103 mmol/L (ref 98–111)
Creatinine, Ser: 1.07 mg/dL (ref 0.61–1.24)
GFR, Estimated: >60 mL/min (ref >60)
Glucose, Bld: 108 mg/dL — ABNORMAL HIGH (ref 70–99)
Potassium: 4 mmol/L (ref 3.5–5.1)
Sodium: 140 mmol/L (ref 135–145)
Total Bilirubin: 0.9 mg/dL (ref 0.0–1.2)
Total Protein: 7.5 g/dL (ref 6.5–8.1)

## 2024-08-21 LAB — LIPID PANEL
Cholesterol: 153 mg/dL (ref 0–200)
HDL: 33 mg/dL — ABNORMAL LOW (ref >40)
LDL Cholesterol: 100 mg/dL — ABNORMAL HIGH (ref 0–99)
Total CHOL/HDL Ratio: 4.6 ratio
Triglycerides: 98 mg/dL (ref <150)
VLDL: 20 mg/dL (ref 0–40)

## 2024-08-21 LAB — MAGNESIUM: Magnesium: 2.2 mg/dL (ref 1.7–2.4)

## 2024-08-23 ENCOUNTER — Ambulatory Visit: Payer: Self-pay | Admitting: Nurse Practitioner

## 2024-09-08 ENCOUNTER — Encounter: Payer: Self-pay | Admitting: Nurse Practitioner

## 2024-09-08 ENCOUNTER — Telehealth: Payer: Self-pay | Admitting: Nurse Practitioner

## 2024-09-08 ENCOUNTER — Ambulatory Visit: Attending: Nurse Practitioner | Admitting: Nurse Practitioner

## 2024-09-08 VITALS — BP 122/80 | HR 89 | Ht 69.0 in | Wt 159.4 lb

## 2024-09-08 DIAGNOSIS — I5032 Chronic diastolic (congestive) heart failure: Secondary | ICD-10-CM | POA: Diagnosis present

## 2024-09-08 DIAGNOSIS — E785 Hyperlipidemia, unspecified: Secondary | ICD-10-CM | POA: Insufficient documentation

## 2024-09-08 DIAGNOSIS — I10 Essential (primary) hypertension: Secondary | ICD-10-CM | POA: Diagnosis present

## 2024-09-08 DIAGNOSIS — J449 Chronic obstructive pulmonary disease, unspecified: Secondary | ICD-10-CM | POA: Diagnosis present

## 2024-09-08 DIAGNOSIS — I2510 Atherosclerotic heart disease of native coronary artery without angina pectoris: Secondary | ICD-10-CM | POA: Insufficient documentation

## 2024-09-08 DIAGNOSIS — I480 Paroxysmal atrial fibrillation: Secondary | ICD-10-CM

## 2024-09-08 DIAGNOSIS — I4891 Unspecified atrial fibrillation: Secondary | ICD-10-CM | POA: Diagnosis present

## 2024-09-08 MED ORDER — CARVEDILOL 12.5 MG PO TABS: 12.5000 mg | ORAL_TABLET | Freq: Two times a day (BID) | ORAL | 2 refills | Status: AC

## 2024-09-08 NOTE — Progress Notes (Signed)
 Office Visit    Patient Name: Caleb Perkins Date of Encounter: 09/08/2024 PCP:  [facility]  Cardiologist:  Alvan Carrier, MD  Advanced Practice Provider:  Miriam Norris, NP Electrophysiologist:  None   Chief Complaint and HPI    Caleb Perkins is a 48 y.o. male with a hx of CAD, s/p NSTEMI and CABG, hypertension, HLD, tobacco abuse, marijuana abuse, ischemic cardiomyopathy, PAF, COPD, ascending thoracic aortic aneurysm, who presents today for 4-6 week follow-up.   Presented to [facility] with worsening shortness of breath and chest pain on April 19, 2023.  Also noted some intermittent ankle edema, denies syncope.  Was found to have hypertensive emergency with blood pressure 228/156.  Troponin maximum 102, BNP 495.  Received IV labetalol .  Cardiology was consulted.  CTA negative for aortic dissection, no noted to have mild dilation of ascending aorta measuring 4.1 cm and intralobular septal thickening in both lungs, also noted to have small right pleural effusion with patchy groundglass opacities in right upper and lower lobes.  Echocardiogram revealed EF 35 to 40%, LV with global hypokinesis, mild AI, MR, and TR, and aortic dilatation of 40 mm.  Underwent cardiac catheterization that revealed 95% stenosis along the ostial to proximal LAD, mid LAD with 85% stenosis, 75% stenosis along mid to distal circumflex, and proximal to distal RCA with 100% stenosis.  Then underwent CABG x 5 utilizing SVG sequential to OM1 and OM 2, LIMA to LAD, SVG to diagonal, and radial artery to PDA.  Tolerated procedure well.  Developed A-fib on postop day 3, started on oral amiodarone  loading, also started on Entresto  and Farxiga  for his systolic heart failure.  Remained in sinus rhythm and had bursts of A-fib with RVR and NSVT.  Converted later on to normal sinus rhythm with bolus of amiodarone , oral amiodarone  was continued.  Started on Eliquis  for stroke  prevention.  I last saw patient on 08/15/2023. Was doing well at the time overall. Did admit to some dizziness occasionally with prolonged hot showers, denied any orthostatic symptoms.   05/05/2024 - He presents for follow-up with his mother.  Shows me a copy of his most recent lab report that shows LDL-69, CBC-WNL, creatinine 1.28 with eGFR 70.  Rest of CMET normal.  A1c was 5.8%. Admits to weight gain, swelling. Denies any chest pain, shortness of breath, palpitations, syncope, presyncope, orthopnea, PND, acute bleeding, or claudication. Does admit to rare episodes of dizziness when bending over and standing up.   07/21/2024 -  Here for follow-up. Admits to continued episodes of dizziness, also states he has near syncopal episodes with this. No real difference in symptoms since last office visit. Weight has reduced. Denies any chest pain, shortness of breath, palpitations, syncope, orthopnea, PND, swelling or significant weight changes, acute bleeding, or claudication.  09/08/2024 -he is here today for follow-up.  Doing great and denies any recurring dizziness since last medication change.  Denies any acute cardiac plaints or issues. Denies any chest pain, shortness of breath, palpitations, syncope, presyncope, dizziness, orthopnea, PND, swelling or significant weight changes, acute bleeding, or claudication.  His mother wants to know if he should continue taking Eliquis  while also on Aspirin .   SH: Used to work at [employer], mother is a Charity fundraiser at [employer] in [place]  EKGs/Labs/Other Studies Reviewed:   The following studies were reviewed today:  EKG: EKG is not ordered today.   Cardiac monitor (preliminary report) 07/2024:  Patch Wear Time:  13 days and 20 hours (2025-07-22T13:21:53-0400 to 2025-08-05T10:10:08-399)   Patient had a min HR of 53 bpm, max HR of 200 bpm, and avg HR of 86 bpm. Predominant underlying rhythm was Sinus Rhythm. 5 Supraventricular Tachycardia runs  occurred, the run with the fastest interval lasting 5 beats with a max rate of 200 bpm, the  longest lasting 18 beats with an avg rate of 131 bpm. Some episodes of Supraventricular Tachycardia may be possible Atrial Tachycardia with variable block. Isolated SVEs were rare (<1.0%), SVE Couplets were rare (<1.0%), and SVE Triplets were rare  (<1.0%). Isolated VEs were rare (<1.0%), VE Couplets were rare (<1.0%), and no VE Triplets were present. Ventricular Bigeminy and Trigeminy were present.  Echo 08/2023:  1. Left ventricular ejection fraction, by estimation, is 60 to 65% with  apical hypokinesis. The left ventricle has normal function. Left  ventricular diastolic parameters were normal.   2. Right ventricular systolic function is normal. The right ventricular  size is normal. There is normal pulmonary artery systolic pressure.   3. Left atrial size was mildly dilated.   4. Right atrial size was mildly dilated.   5. The mitral valve is normal in structure. Trivial mitral valve  regurgitation. No evidence of mitral stenosis.   6. The aortic valve was not well visualized. Aortic valve regurgitation  is not visualized. No aortic stenosis is present.   7. Aortic dilatation noted. There is borderline dilatation of the aortic  root, measuring 39 mm.   8. The inferior vena cava is normal in size with <50% respiratory  variability, suggesting right atrial pressure of 8 mmHg.   Comparison(s): Changes from prior study are noted. LVEF improved from  35-40% in 04/2023 to 60-65% with apical hypokinesis now.  TEE Post-op 04/25/2023:  Complications: No known complications during this procedure.  POST-OP IMPRESSIONS  _ Left Ventricle: LVEF marginally improved, 40-45%. Some improved wall  motion on  the septum.  _ Right Ventricle: The right ventricle appears unchanged from pre-bypass.  _ Aorta: The aorta appears unchanged from pre-bypass.  _ Left Atrial Appendage: The left atrial appendage appears  unchanged from  pre-bypass.  _ Mitral Valve: The mitral valve appears unchanged from pre-bypass.  _ Tricuspid Valve: The tricuspid valve appears unchanged from pre-bypass.  _ Pulmonic Valve: The pulmonic valve appears unchanged from pre-bypass.  _ Interatrial Septum: The interatrial septum appears unchanged from  pre-bypass.   PRE-OP FINDINGS   Left Ventricle: The left ventricle has moderately reduced systolic  function, with an ejection fraction of 35-40%. The cavity size was mildly  dilated. There is moderate eccentric left ventricular hypertrophy.  Doppler 04/2023:  Summary:  Right Carotid: Velocities in the right ICA are consistent with a 1-39%  stenosis.   Left Carotid: Velocities in the left ICA are consistent with a 1-39%  stenosis.  Vertebrals: Bilateral vertebral arteries demonstrate antegrade flow.  Subclavians: Normal flow hemodynamics were seen in bilateral subclavian               arteries.   Right ABI: Triphasic PTA, multiphasic DPA.  Left ABI: Triphasic PTA and DPA.    Right Upper Extremity: Doppler waveforms remain within normal limits with  right radial compression. Doppler waveforms remain within normal limits  with right ulnar compression.  Left Upper Extremity: Doppler waveforms remain within normal limits with  left radial compression. Doppler waveforms remain within normal limits  with left ulnar compression.     EKG:  EKG is not  ordered today.    Right and left heart cath 04/2023: Conclusions: Severe three-vessel coronary artery disease, as detailed below, including sequential 95% ostial and 80-90% mid LAD stenoses, 60% OM1 lesion, 70-80% mid/distal LCx disease, and chronic total occlusion of proximal/mid RCA with left-to-right and right-to-right collaterals. Moderately reduced left ventricular systolic function (LVEF 35-45%). Normal left heart, right heart, and pulmonary artery pressures. Normal Fick cardiac output/index.   Recommendations: Gentle  post-catheterization hydration; aim for net even fluid balance. Escalate goal directed medical therapy for acute HFrEF; I will add Entresto  24-26 mg BID beginning this evening. Cardiac surgery consultation for CABG. Aggressive secondary prevention of coronary artery disease. Start heparin  infusion 2 hours after TR band removal in the setting of severe multivessel CAD and elevated troponin this admission.  Echo 04/2023: 1. Left ventricular ejection fraction, by estimation, is 35 to 40%. The  left ventricle has moderately decreased function. The left ventricle  demonstrates global hypokinesis. There is moderate left ventricular  hypertrophy. Left ventricular diastolic  parameters are consistent with Grade II diastolic dysfunction  (pseudonormalization). Elevated left atrial pressure.   2. Right ventricular systolic function is normal. The right ventricular  size is normal. There is severely elevated pulmonary artery systolic  pressure.   3. Left atrial size was mildly dilated.   4. Right atrial size was mildly dilated.   5. The mitral valve is abnormal. Mild mitral valve regurgitation. No  evidence of mitral stenosis.   6. The tricuspid valve is abnormal.   7. The aortic valve is tricuspid. Aortic valve regurgitation is mild. No  aortic stenosis is present.   8. Aortic dilatation noted. There is mild dilatation of the ascending  aorta, measuring 40 mm.   9. The inferior vena cava is dilated in size with <50% respiratory  variability, suggesting right atrial pressure of 15 mmHg.   Review of Systems    All other systems reviewed and are otherwise negative except as noted above.  Physical Exam    VS:  BP 122/80   Pulse 89   Ht 5' 9 (1.753 m)   Wt 159 lb 6.4 oz (72.3 kg)   SpO2 98%   BMI 23.54 kg/m  , BMI Body mass index is 23.54 kg/m.  Wt Readings from Last 3 Encounters:  09/08/24 159 lb 6.4 oz (72.3 kg)  07/21/24 163 lb 3.2 oz (74 kg)  05/05/24 172 lb 3.2 oz (78.1 kg)      GEN: 48 y.o. male in no acute distress. HEENT: normal. Neck: Supple, no JVD, carotid bruits, or masses. Cardiac: S1/S2, RRR, no murmurs, rubs, or gallops. No clubbing, cyanosis. No edema to BLE. Radials/PT 2+ and equal bilaterally.  Respiratory:  Respirations regular and unlabored, clear to auscultation bilaterally. MS: No deformity or atrophy. Skin: Pale appearance, warm and dry, no rash.  Neuro:  Strength and sensation are intact. Psych: Normal affect.  Assessment & Plan    Chronic HFpEF, ICM Stage C, NYHA class I-II symptoms. EF 08/2023 was 60-65%. Euvolemic and well compensated on exam.  Could not tolerate Aldactone  as this caused dizziness.  Will increase carvedilol  to 12.5 mg twice daily to help maximally titrate his GDMT.  Continue rest of GDMT. Low sodium diet, fluid restriction <2L, and daily weights encouraged. Educated to contact our office for weight gain of 2 lbs overnight or 5 lbs in one week.  CAD, s/p NSTEMI and CABG x 5 (04/2023) Stable with no anginal symptoms. Doing very well.   Stopping Eliquis  as noted below.  Continue current medication regimen. Heart healthy diet and regular cardiovascular exercise encouraged. Congratulated him that he quit smoking nicotine .   3. Post-op A-fib Had post-op A-fib after CABG. Chart reviewed and was previously instructed by cardiothoracic surgery to stop Eliquis  in the past.  Appears he is currently taking this.  Instructed him to stop this and continue his baby aspirin .  Denies any bleeding issues.  Increasing carvedilol  as noted above.  No other medication changes.  Continue follow-up with PCP.  4. HTN  BP stable. Past orthostatics negative.  Increasing Coreg  as noted above, no other medication changes.  Discussed to monitor BP at home at least 2 hours after medications and sitting for 5-10 minutes. Heart healthy diet and regular cardiovascular exercise encouraged. Discussed conservative measures and he verbalized understanding.  Care and  ED precautions discussed.   5. HLD Most recent LDL 100.  LDL goal is less than 55.  Instructed to work on medication compliance and plan to recheck FLP/LFT in 2-3 months at [facility]. Heart healthy diet and regular cardiovascular exercise encouraged.  7. COPD Denies any SHOB or acute exacerbation. No medication changes. Continue to follow with PCP.   Follow up in 6-8 weeks via telephone visit with Alvan Carrier, MD or APP.  Signed, Almarie Crate, NP

## 2024-09-08 NOTE — Telephone Encounter (Signed)
  Patient Consent for Virtual Visit        Caleb Perkins has provided verbal consent on 09/08/2024 for a virtual visit (video or telephone).   CONSENT FOR VIRTUAL VISIT FOR:  Caleb Perkins Finger  By participating in this virtual visit I agree to the following:  I hereby voluntarily request, consent and authorize [facility] and its employed or contracted physicians, physician assistants, nurse practitioners or other licensed health care professionals (the Practitioner), to provide me with telemedicine health care services (the "Services) as deemed necessary by the treating Practitioner. I acknowledge and consent to receive the Services by the Practitioner via telemedicine. I understand that the telemedicine visit will involve communicating with the Practitioner through live audiovisual communication technology and the disclosure of certain medical information by electronic transmission. I acknowledge that I have been given the opportunity to request an in-person assessment or other available alternative prior to the telemedicine visit and am voluntarily participating in the telemedicine visit.  I understand that I have the right to withhold or withdraw my consent to the use of telemedicine in the course of my care at any time, without affecting my right to future care or treatment, and that the Practitioner or I may terminate the telemedicine visit at any time. I understand that I have the right to inspect all information obtained and/or recorded in the course of the telemedicine visit and may receive copies of available information for a reasonable fee.  I understand that some of the potential risks of receiving the Services via telemedicine include:  Delay or interruption in medical evaluation due to technological equipment failure or disruption; Information transmitted may not be sufficient (e.g. poor resolution of images) to allow for appropriate medical decision making by the  Practitioner; and/or  In rare instances, security protocols could fail, causing a breach of personal health information.  Furthermore, I acknowledge that it is my responsibility to provide information about my medical history, conditions and care that is complete and accurate to the best of my ability. I acknowledge that Practitioner's advice, recommendations, and/or decision may be based on factors not within their control, such as incomplete or inaccurate data provided by me or distortions of diagnostic images or specimens that may result from electronic transmissions. I understand that the practice of medicine is not an exact science and that Practitioner makes no warranties or guarantees regarding treatment outcomes. I acknowledge that a copy of this consent can be made available to me via my patient portal [facility] MyChart), or I can request a printed copy by calling the office of [facility].    I understand that my insurance will be billed for this visit.   I have read or had this consent read to me. I understand the contents of this consent, which adequately explains the benefits and risks of the Services being provided via telemedicine.  I have been provided ample opportunity to ask questions regarding this consent and the Services and have had my questions answered to my satisfaction. I give my informed consent for the services to be provided through the use of telemedicine in my medical care

## 2024-09-08 NOTE — Patient Instructions (Addendum)
 Medication Instructions:  Your physician has recommended you make the following change in your medication:  Please stop Eliquis   Please Increase Carvedilol  to 12.5 Mg twice daily   Labwork: In 2-3 months at [facility]   Testing/Procedures: None   Follow-Up: Your physician recommends that you schedule a follow-up appointment in: 6-8 week telephone   Any Other Special Instructions Will Be Listed Below (If Applicable).  If you need a refill on your cardiac medications before your next appointment, please call your pharmacy.

## 2024-09-16 ENCOUNTER — Other Ambulatory Visit: Payer: Self-pay | Admitting: Nurse Practitioner

## 2024-09-16 MED ORDER — ATORVASTATIN CALCIUM 80 MG PO TABS: 80.0000 mg | ORAL_TABLET | Freq: Every day | ORAL | 6 refills | Status: AC

## 2024-09-25 ENCOUNTER — Other Ambulatory Visit (HOSPITAL_COMMUNITY): Payer: Self-pay

## 2024-09-25 ENCOUNTER — Telehealth: Payer: Self-pay | Admitting: Pharmacy Technician

## 2024-09-25 NOTE — Telephone Encounter (Signed)
   He is getting brand

## 2024-10-01 ENCOUNTER — Encounter (INDEPENDENT_AMBULATORY_CARE_PROVIDER_SITE_OTHER): Payer: Self-pay | Admitting: *Deleted

## 2024-10-06 ENCOUNTER — Ambulatory Visit: Payer: Self-pay | Admitting: Nurse Practitioner

## 2024-10-06 DIAGNOSIS — R55 Syncope and collapse: Secondary | ICD-10-CM | POA: Diagnosis not present

## 2024-10-23 ENCOUNTER — Ambulatory Visit: Attending: Nurse Practitioner | Admitting: Nurse Practitioner

## 2024-10-23 ENCOUNTER — Encounter: Payer: Self-pay | Admitting: Nurse Practitioner

## 2024-10-23 VITALS — BP 127/112 | HR 100 | Ht 68.0 in | Wt 158.0 lb

## 2024-10-23 DIAGNOSIS — E782 Mixed hyperlipidemia: Secondary | ICD-10-CM

## 2024-10-23 DIAGNOSIS — Z79899 Other long term (current) drug therapy: Secondary | ICD-10-CM

## 2024-10-23 DIAGNOSIS — I2510 Atherosclerotic heart disease of native coronary artery without angina pectoris: Secondary | ICD-10-CM

## 2024-10-23 DIAGNOSIS — I4891 Unspecified atrial fibrillation: Secondary | ICD-10-CM | POA: Diagnosis not present

## 2024-10-23 DIAGNOSIS — I255 Ischemic cardiomyopathy: Secondary | ICD-10-CM

## 2024-10-23 DIAGNOSIS — J449 Chronic obstructive pulmonary disease, unspecified: Secondary | ICD-10-CM

## 2024-10-23 DIAGNOSIS — I10 Essential (primary) hypertension: Secondary | ICD-10-CM

## 2024-10-23 DIAGNOSIS — I5032 Chronic diastolic (congestive) heart failure: Secondary | ICD-10-CM

## 2024-10-23 DIAGNOSIS — E785 Hyperlipidemia, unspecified: Secondary | ICD-10-CM

## 2024-10-23 NOTE — Progress Notes (Addendum)
 Virtual Visit via Telephone Note   Because of [facility]'s co-morbid illnesses, he is at least at moderate risk for complications without adequate follow up.  This format is felt to be most appropriate for this patient at this time.  The patient did not have access to video technology/had technical difficulties with video requiring transitioning to audio format only (telephone).  All issues noted in this document were discussed and addressed.  No physical exam could be performed with this format.  Please refer to the patient's chart for his consent to telehealth for [facility].    Date:  10/23/2024  ID:  [facility], DOB April 15, 1976, MRN [id number] The patient was identified using 2 identifiers.  Patient Location: Home Provider Location: Office/Clinic   PCP:  Caleb Raina Suzanna Zahn, NP   [facility] Providers Cardiologist:  Caleb Carrier, MD Cardiology APP:  [facility] Norris, NP     Evaluation Performed:  Follow-Up Visit  Chief Complaint:  Here for blood pressure evaluation  History of Present Illness:    [facility] is a 48 y.o. male with a PMH of CAD, s/p NSTEMI and CABG, hypertension, HLD, tobacco abuse, marijuana abuse, ischemic cardiomyopathy, PAF, COPD, ascending thoracic aortic aneurysm, who presents today for telephone follow-up.   Presented to [facility] ED with worsening shortness of breath and chest pain on April 19, 2023.  Also noted some intermittent ankle edema, denies syncope.  Was found to have hypertensive emergency with blood pressure 228/156.  Troponin maximum 102, BNP 495.  Received IV labetalol .  Cardiology was consulted.  CTA negative for aortic dissection, no noted to have mild dilation of ascending aorta measuring 4.1 cm and intralobular septal thickening in both lungs, also noted to have small right pleural effusion with patchy groundglass opacities in right upper and lower lobes.  Echocardiogram revealed EF 35 to 40%, LV  with global hypokinesis, mild AI, MR, and TR, and aortic dilatation of 40 mm.  Underwent cardiac catheterization that revealed 95% stenosis along the ostial to proximal LAD, mid LAD with 85% stenosis, 75% stenosis along mid to distal circumflex, and proximal to distal RCA with 100% stenosis.  Then underwent CABG x 5 utilizing SVG sequential to OM1 and OM 2, LIMA to LAD, SVG to diagonal, and radial artery to PDA.  Tolerated procedure well.  Developed A-fib on postop day 3, started on oral amiodarone  loading, also started on Entresto  and Farxiga  for his systolic heart failure.  Remained in sinus rhythm and had bursts of A-fib with RVR and NSVT.  Converted later on to normal sinus rhythm with bolus of amiodarone , oral amiodarone  was continued.  Started on Eliquis  for stroke prevention.  I last saw patient on 08/15/2023. Was doing well at the time overall. Did admit to some dizziness occasionally with prolonged hot showers, denied any orthostatic symptoms.   05/05/2024 - He presents for follow-up with his mother.  Shows me a copy of his most recent lab report that shows LDL-69, CBC-WNL, creatinine 1.28 with eGFR 70.  Rest of CMET normal.  A1c was 5.8%. Admits to weight gain, swelling. Denies any chest pain, shortness of breath, palpitations, syncope, presyncope, orthopnea, PND, acute bleeding, Perkins claudication. Does admit to rare episodes of dizziness when bending over and standing up.   07/21/2024 -  Here for follow-up. Admits to continued episodes of dizziness, also states he has near syncopal episodes with this. No real difference in symptoms since last office visit. Weight has reduced. Denies any  chest pain, shortness of breath, palpitations, syncope, orthopnea, PND, swelling Perkins significant weight changes, acute bleeding, Perkins claudication.  09/08/2024 -he is here today for follow-up.  Doing great and denies any recurring dizziness since last medication change.  Denies any acute cardiac plaints Perkins issues. Denies any  chest pain, shortness of breath, palpitations, syncope, presyncope, dizziness, orthopnea, PND, swelling Perkins significant weight changes, acute bleeding, Perkins claudication.  His mother wants to know if he should continue taking Eliquis  while also on Aspirin .   10/23/2024 - Doing well and denies any acute cardiac complaints Perkins issues.  Blood pressure readings have been well-controlled per his report.  SH: Used to work at [facility], mother is a [employer] at [employer] [facility] in [place]   Past Medical History:  Diagnosis Date   Blood transfusion without reported diagnosis    Hypertension    Past Surgical History:  Procedure Laterality Date   CORONARY ARTERY BYPASS GRAFT N/A 04/25/2023   Procedure: CORONARY ARTERY BYPASS GRAFTING (CABG) X 5 USING LEFT INTERNAL MAMMARY ARTERY AND ENDOSCOPICALLY HARVESTED RIGHT GREATER SAPHENOUS VEIN;  Surgeon: [facility] Elspeth BROCKS, MD;  Location: [facility];  Service: Open Heart Surgery;  Laterality: N/A;   RADIAL ARTERY HARVEST Left 04/25/2023   Procedure: RADIAL ARTERY HARVEST;  Surgeon: [facility] Elspeth BROCKS, MD;  Location: [facility];  Service: Open Heart Surgery;  Laterality: Left;   RIGHT/LEFT HEART CATH AND CORONARY ANGIOGRAPHY N/A 04/22/2023   Procedure: RIGHT/LEFT HEART CATH AND CORONARY ANGIOGRAPHY;  Surgeon: Mady Bruckner, MD;  [facility];  Service: Cardiovascular;  Laterality: N/A;   TEE WITHOUT CARDIOVERSION N/A 04/25/2023   Procedure: TRANSESOPHAGEAL ECHOCARDIOGRAM;  Surgeon: [facility] Elspeth BROCKS, MD;  Location: [facility];  Service: Open Heart Surgery;  Laterality: N/A;     Current Meds  Medication Sig   albuterol  (VENTOLIN  HFA) 108 (90 Base) MCG/ACT inhaler Inhale 2 puffs into the lungs every 6 (six) hours as needed for wheezing Perkins shortness of breath.   aspirin  EC 81 MG tablet Take 1 tablet (81 mg total) by mouth daily. Swallow whole.   atorvastatin  (LIPITOR) 80 MG tablet Take 1 tablet (80 mg total) by mouth daily.   carvedilol  (COREG )  12.5 MG tablet Take 1 tablet (12.5 mg total) by mouth 2 (two) times daily with a meal.   dapagliflozin  propanediol (FARXIGA ) 10 MG TABS tablet Take 1 tablet by mouth once daily   ezetimibe  (ZETIA ) 10 MG tablet Take 1 tablet (10 mg total) by mouth daily.   Fluticasone -Salmeterol (ADVAIR DISKUS) 250-50 MCG/DOSE AEPB Inhale 1 puff into the lungs 2 (two) times daily. (Patient taking differently: Inhale 1 puff into the lungs as needed.)   furosemide  (LASIX ) 40 MG tablet Take 1 tablet (40 mg total) by mouth daily.   isosorbide  mononitrate (IMDUR ) 30 MG 24 hr tablet Take 1 tablet by mouth once daily   potassium chloride  SA (KLOR-CON  M) 20 MEQ tablet Take 1 tablet (20 mEq total) by mouth daily.   sacubitril -valsartan  (ENTRESTO ) 49-51 MG Take 1 tablet by mouth twice daily     Allergies:   Mite (d. farinae)   Social History   Tobacco Use   Smoking status: Every Day    Current packs/day: 0.00    Types: Cigarettes    Last attempt to quit: 04/22/2023    Years since quitting: 1.5   Smokeless tobacco: Never  Vaping Use   Vaping status: Never Used  Substance Use Topics   Alcohol use: Not Currently   Drug use: Not  Currently    Types: Marijuana     Family Hx: The patient's family history includes Hypertension in his mother.  ROS:   Please see the history of present illness.    All other systems reviewed and are negative.   Prior CV studies:   The following studies were reviewed today:  Cardiac monitor (preliminary report) 07/2024:  Patch Wear Time:  13 days and 20 hours (2025-07-22T13:21:53-0400 to 2025-08-05T10:10:08-399)   Patient had a min HR of 53 bpm, max HR of 200 bpm, and avg HR of 86 bpm. Predominant underlying rhythm was Sinus Rhythm. 5 Supraventricular Tachycardia runs occurred, the run with the fastest interval lasting 5 beats with a max rate of 200 bpm, the  longest lasting 18 beats with an avg rate of 131 bpm. Some episodes of Supraventricular Tachycardia may be possible Atrial  Tachycardia with variable block. Isolated SVEs were rare (<1.0%), SVE Couplets were rare (<1.0%), and SVE Triplets were rare  (<1.0%). Isolated VEs were rare (<1.0%), VE Couplets were rare (<1.0%), and no VE Triplets were present. Ventricular Bigeminy and Trigeminy were present.  Echo 08/2023:  1. Left ventricular ejection fraction, by estimation, is 60 to 65% with  apical hypokinesis. The left ventricle has normal function. Left  ventricular diastolic parameters were normal.   2. Right ventricular systolic function is normal. The right ventricular  size is normal. There is normal pulmonary artery systolic pressure.   3. Left atrial size was mildly dilated.   4. Right atrial size was mildly dilated.   5. The mitral valve is normal in structure. Trivial mitral valve  regurgitation. No evidence of mitral stenosis.   6. The aortic valve was not well visualized. Aortic valve regurgitation  is not visualized. No aortic stenosis is present.   7. Aortic dilatation noted. There is borderline dilatation of the aortic  root, measuring 39 mm.   8. The inferior vena cava is normal in size with <50% respiratory  variability, suggesting right atrial pressure of 8 mmHg.   Comparison(s): Changes from prior study are noted. LVEF improved from  35-40% in 04/2023 to 60-65% with apical hypokinesis now.  TEE Post-op 04/25/2023:  Complications: No known complications during this procedure.  POST-OP IMPRESSIONS  _ Left Ventricle: LVEF marginally improved, 40-45%. Some improved wall  motion on  the septum.  _ Right Ventricle: The right ventricle appears unchanged from pre-bypass.  _ Aorta: The aorta appears unchanged from pre-bypass.  _ Left Atrial Appendage: The left atrial appendage appears unchanged from  pre-bypass.  _ Mitral Valve: The mitral valve appears unchanged from pre-bypass.  _ Tricuspid Valve: The tricuspid valve appears unchanged from pre-bypass.  _ Pulmonic Valve: The pulmonic valve appears  unchanged from pre-bypass.  _ Interatrial Septum: The interatrial septum appears unchanged from  pre-bypass.   PRE-OP FINDINGS   Left Ventricle: The left ventricle has moderately reduced systolic  function, with an ejection fraction of 35-40%. The cavity size was mildly  dilated. There is moderate eccentric left ventricular hypertrophy.  Doppler 04/2023:  Summary:  Right Carotid: Velocities in the right ICA are consistent with a 1-39%  stenosis.   Left Carotid: Velocities in the left ICA are consistent with a 1-39%  stenosis.  Vertebrals: Bilateral vertebral arteries demonstrate antegrade flow.  Subclavians: Normal flow hemodynamics were seen in bilateral subclavian               arteries.   Right ABI: Triphasic PTA, multiphasic DPA.  Left ABI: Triphasic PTA and DPA.  Right Upper Extremity: Doppler waveforms remain within normal limits with  right radial compression. Doppler waveforms remain within normal limits  with right ulnar compression.  Left Upper Extremity: Doppler waveforms remain within normal limits with  left radial compression. Doppler waveforms remain within normal limits  with left ulnar compression.     EKG:  EKG is not ordered today.    Right and left heart cath 04/2023: Conclusions: Severe three-vessel coronary artery disease, as detailed below, including sequential 95% ostial and 80-90% mid LAD stenoses, 60% OM1 lesion, 70-80% mid/distal LCx disease, and chronic total occlusion of proximal/mid RCA with left-to-right and right-to-right collaterals. Moderately reduced left ventricular systolic function (LVEF 35-45%). Normal left heart, right heart, and pulmonary artery pressures. Normal Fick cardiac output/index.   Recommendations: Gentle post-catheterization hydration; aim for net even fluid balance. Escalate goal directed medical therapy for acute HFrEF; I will add Entresto  24-26 mg BID beginning this evening. Cardiac surgery consultation for  CABG. Aggressive secondary prevention of coronary artery disease. Start heparin  infusion 2 hours after TR band removal in the setting of severe multivessel CAD and elevated troponin this admission.  Echo 04/2023: 1. Left ventricular ejection fraction, by estimation, is 35 to 40%. The  left ventricle has moderately decreased function. The left ventricle  demonstrates global hypokinesis. There is moderate left ventricular  hypertrophy. Left ventricular diastolic  parameters are consistent with Grade II diastolic dysfunction  (pseudonormalization). Elevated left atrial pressure.   2. Right ventricular systolic function is normal. The right ventricular  size is normal. There is severely elevated pulmonary artery systolic  pressure.   3. Left atrial size was mildly dilated.   4. Right atrial size was mildly dilated.   5. The mitral valve is abnormal. Mild mitral valve regurgitation. No  evidence of mitral stenosis.   6. The tricuspid valve is abnormal.   7. The aortic valve is tricuspid. Aortic valve regurgitation is mild. No  aortic stenosis is present.   8. Aortic dilatation noted. There is mild dilatation of the ascending  aorta, measuring 40 mm.   9. The inferior vena cava is dilated in size with <50% respiratory  variability, suggesting right atrial pressure of 15 mmHg.  Labs/Other Tests and Data Reviewed:    EKG:  No ECG reviewed.  Recent Labs: 05/15/2024: B Natriuretic Peptide 29.0 08/21/2024: ALT 14; BUN 22; Creatinine, Ser 1.07; Magnesium  2.2; Potassium 4.0; Sodium 140   Recent Lipid Panel Lab Results  Component Value Date/Time   CHOL 153 08/21/2024 11:19 AM   TRIG 98 08/21/2024 11:19 AM   HDL 33 (L) 08/21/2024 11:19 AM   CHOLHDL 4.6 08/21/2024 11:19 AM   LDLCALC 100 (H) 08/21/2024 11:19 AM    Wt Readings from Last 3 Encounters:  10/23/24 158 lb (71.7 kg)  09/08/24 159 lb 6.4 oz (72.3 kg)  07/21/24 163 lb 3.2 oz (74 kg)        Objective:    Vital Signs:  BP (!)  127/112 Comment: had not taken his morning meds - states he was up moving around  Pulse 100   Ht 5' 8 (1.727 m)   Wt 158 lb (71.7 kg)   BMI 24.02 kg/m    Due to nature of today's visit, physical exam was unable to be performed.   ASSESSMENT & PLAN:    Chronic HFpEF, ICM Stage C, NYHA class I symptoms. EF 08/2023 was 60-65%. Denies any signs Perkins symptoms of volume overload.  Could not tolerate Aldactone  as this caused dizziness. Continue  current GDMT. Low sodium diet, fluid restriction <2L, and daily weights encouraged. Educated to contact our office for weight gain of 2 lbs overnight Perkins 5 lbs in one week.  CAD, s/p NSTEMI and CABG x 5 (04/2023) Stable with no anginal symptoms. Doing very well. Continue current medication regimen. Heart healthy diet and regular cardiovascular exercise encouraged. I have already congratulated him that he quit smoking nicotine .   3. Post-op A-fib Had post-op A-fib after CABG. No medication changes at this time.  Continue follow-up with PCP.  4. HTN  BP stable. Past orthostatics negative. Discussed to monitor BP at home at least 2 hours after medications and sitting for 5-10 minutes. Heart healthy diet and regular cardiovascular exercise encouraged.  Continue current medication regimen.  Discussed conservative measures and he verbalized understanding.  Care and ED precautions discussed.   5. HLD, medication management Most recent LDL 100.  LDL goal is less than 55.  I have already previously instructed him to work on medication compliance and we will recheck FLP/LFT in 2-3 months at [facility]. Heart healthy diet and regular cardiovascular exercise encouraged.  7. COPD Denies any SHOB Perkins acute exacerbation. No medication changes. Continue to follow with PCP.   Time:   Today, I have spent 2 minutes with the patient with telehealth technology discussing the above problems.    I spent a total duration of 10 minutes reviewing prior notes, reviewing outside records  including  labs, telephone counseling of medical condition, pathophysiology, evaluation, management, and documenting the findings in the note.    Medication Adjustments/Labs and Tests Ordered: Current medicines are reviewed at length with the patient today.  Concerns regarding medicines are outlined above.   Tests Ordered: Orders Placed This Encounter  Procedures   Lipid panel   Hepatic function panel    Medication Changes: No orders of the defined types were placed in this encounter.   Follow Up:  In Person in 6 month(s)  Signed, Almarie Crate, NP

## 2024-10-23 NOTE — Patient Instructions (Signed)
 Medication Instructions:   Continue all current medications.  Labwork:  FLP, LFT - orders enclosed  Reminder:  Nothing to eat or drink after 12 midnight prior to labs. Please do this in December  Office will contact with results via phone, letter or mychart.     Testing/Procedures:  none  Follow-Up:  6 months   Any Other Special Instructions Will Be Listed Below (If Applicable).   If you need a refill on your cardiac medications before your next appointment, please call your pharmacy.

## 2024-11-15 LAB — COLOGUARD: COLOGUARD: POSITIVE — AB

## 2024-11-23 ENCOUNTER — Encounter: Payer: Self-pay | Admitting: Gastroenterology

## 2024-11-30 ENCOUNTER — Other Ambulatory Visit (HOSPITAL_COMMUNITY)
Admission: RE | Admit: 2024-11-30 | Discharge: 2024-11-30 | Disposition: A | Discharge status: Home / Self Care | Source: Ambulatory Visit | Attending: Nurse Practitioner | Admitting: Nurse Practitioner

## 2024-11-30 DIAGNOSIS — I2510 Atherosclerotic heart disease of native coronary artery without angina pectoris: Secondary | ICD-10-CM | POA: Diagnosis present

## 2024-11-30 DIAGNOSIS — E785 Hyperlipidemia, unspecified: Secondary | ICD-10-CM | POA: Insufficient documentation

## 2024-11-30 DIAGNOSIS — Z79899 Other long term (current) drug therapy: Secondary | ICD-10-CM | POA: Insufficient documentation

## 2024-11-30 LAB — HEPATIC FUNCTION PANEL
ALT: 18 U/L (ref 0–44)
AST: 24 U/L (ref 15–41)
Albumin: 4.9 g/dL (ref 3.5–5.0)
Alkaline Phosphatase: 81 U/L (ref 38–126)
Bilirubin, Direct: 0.4 mg/dL — ABNORMAL HIGH (ref 0.0–0.2)
Indirect Bilirubin: 0.6 mg/dL (ref 0.3–0.9)
Total Bilirubin: 1 mg/dL (ref 0.0–1.2)
Total Protein: 7.7 g/dL (ref 6.5–8.1)

## 2024-11-30 LAB — LIPID PANEL
Cholesterol: 90 mg/dL (ref 0–200)
HDL: 41 mg/dL (ref >40)
LDL Cholesterol: 34 mg/dL (ref 0–99)
Total CHOL/HDL Ratio: 2.2 ratio
Triglycerides: 77 mg/dL (ref <150)
VLDL: 15 mg/dL (ref 0–40)

## 2024-12-07 ENCOUNTER — Ambulatory Visit: Payer: Self-pay | Admitting: Nurse Practitioner

## 2024-12-08 NOTE — Telephone Encounter (Signed)
 Patient returned RN's call regarding results.

## 2025-01-11 ENCOUNTER — Ambulatory Visit: Admitting: Gastroenterology

## 2025-01-11 ENCOUNTER — Other Ambulatory Visit: Payer: Self-pay | Admitting: *Deleted

## 2025-01-11 ENCOUNTER — Encounter: Payer: Self-pay | Admitting: Gastroenterology

## 2025-01-11 ENCOUNTER — Encounter: Payer: Self-pay | Admitting: *Deleted

## 2025-01-11 VITALS — BP 96/67 | HR 67 | Temp 97.9°F | Ht 69.0 in | Wt 157.6 lb

## 2025-01-11 DIAGNOSIS — Z800 Family history of malignant neoplasm of digestive organs: Secondary | ICD-10-CM

## 2025-01-11 DIAGNOSIS — R195 Other fecal abnormalities: Secondary | ICD-10-CM | POA: Diagnosis not present

## 2025-01-11 MED ORDER — PEG 3350-KCL-NA BICARB-NACL 420 G PO SOLR: 4000.0000 mL | Freq: Once | ORAL | 0 refills | Status: AC

## 2025-01-11 NOTE — Patient Instructions (Signed)
 Colonoscopy to be scheduled. You will need to hold Farxiga  for 72 hours before your procedure. See instructions that will be provided regarding bowel prep.

## 2025-01-11 NOTE — Progress Notes (Signed)
 "    GI Office Note    Referring Provider: Nsumanganyi, Kalombo Cesar, NP Primary Care Physician:  Nsumanganyi, Kalombo Cesar, NP  Primary Gastroenterologist: Carlin POUR. Cindie, DO   Chief Complaint   Chief Complaint  Patient presents with   Colonoscopy    Positive cologuard     History of Present Illness   Caleb Perkins is a 49 y.o. male presenting today at the request of Kalombo Cesar Nsumanganyi NP for positive cologuard.   Patient has complex medical history including CAD, status post NSTEMI and 5v CABG (04/2023) with post-op Afib, ischemic cardiomyopathy, ascending thoracic aortic aneurysm (4.2cm), hypertension, hyperlipidemia, tobacco abuse, prior marijuana abuse, COPD. Prolonged hospitalization and rehabilitation.   Echo in 08/2023 with LVEF 60 to 65% with apical hypokinesis. No longer on anticoagulation. Takes daily ASA 81mg .  Patient reports aunt/uncle with colon cancer. He reports remote colonoscopy 25 years ago and had a polyp. Records are not available.   Discussed the use of AI scribe software for clinical note transcription with the patient, who gave verbal consent to proceed.  History of Present Illness Reports chronic issues with bowels. Ranges from [place] 1-7. Mostly 1-2 stools daily but sometimes 4-5. Some intermittent rectal pain he relates to hemorrhoids. Occasional blood in the stool also he relates to hemorrhoids/straining at times.    He had a positive Cologuard test ordered by his family physician.    He denies abdominal pain but has occasional upset stomach with urge to defecate. No heartburn, vomiting, dysphagia.   He lost weight from 185 lbs to a nadir of 130 lbs after heart surgery, with current stable weight 150-155 lbs.  The past year has been medically uneventful.  He currently smokes less than a quarter pack per day and is trying to quit. He does not drink alcohol or use illicit drugs.  Wt Readings from Last 3 Encounters:  01/11/25 157 lb  9.6 oz (71.5 kg)  10/23/24 158 lb (71.7 kg)  09/08/24 159 lb 6.4 oz (72.3 kg)      Prior Data   Results    Lab Results  Component Value Date   ALT 18 11/30/2024   AST 24 11/30/2024   ALKPHOS 81 11/30/2024   BILITOT 1.0 11/30/2024    11/30/2024 Cre 1.06, LFTs normal, Hgb 16.3, plt 168 .  Medications   Current Outpatient Medications  Medication Sig Dispense Refill   albuterol  (VENTOLIN  HFA) 108 (90 Base) MCG/ACT inhaler Inhale 2 puffs into the lungs every 6 (six) hours as needed for wheezing or shortness of breath. 18 g 2   aspirin  EC 81 MG tablet Take 1 tablet (81 mg total) by mouth daily. Swallow whole. 30 tablet 11   atorvastatin  (LIPITOR) 80 MG tablet Take 1 tablet (80 mg total) by mouth daily. 30 tablet 6   carvedilol  (COREG ) 12.5 MG tablet Take 1 tablet (12.5 mg total) by mouth 2 (two) times daily with a meal. 180 tablet 2   dapagliflozin  propanediol (FARXIGA ) 10 MG TABS tablet Take 1 tablet by mouth once daily 90 tablet 3   ezetimibe  (ZETIA ) 10 MG tablet Take 1 tablet (10 mg total) by mouth daily. 90 tablet 1   Fluticasone -Salmeterol (ADVAIR DISKUS) 250-50 MCG/DOSE AEPB Inhale 1 puff into the lungs 2 (two) times daily. (Patient taking differently: Inhale 1 puff into the lungs as needed.) 60 each 2   furosemide  (LASIX ) 40 MG tablet Take 1 tablet (40 mg total) by mouth daily. 90 tablet 3   isosorbide  mononitrate (IMDUR )  30 MG 24 hr tablet Take 1 tablet by mouth once daily 90 tablet 3   potassium chloride  SA (KLOR-CON  M) 20 MEQ tablet Take 1 tablet (20 mEq total) by mouth daily. 30 tablet 5   sacubitril -valsartan  (ENTRESTO ) 49-51 MG Take 1 tablet by mouth twice daily 180 tablet 3   polyethylene glycol-electrolytes (NULYTELY ) 420 g solution Take 4,000 mLs by mouth once for 1 dose. 4000 mL 0   No current facility-administered medications for this visit.    Allergies   Allergies as of 01/11/2025 - Review Complete 01/11/2025  Allergen Reaction Noted   Mite (d. farinae) Hives  and Itching 05/10/2023    Past Medical History   Past Medical History:  Diagnosis Date   Blood transfusion without reported diagnosis    CAD (coronary artery disease)    COPD (chronic obstructive pulmonary disease) (HCC)    HLD (hyperlipidemia)    Hypertension    Thoracic ascending aortic aneurysm     Past Surgical History   Past Surgical History:  Procedure Laterality Date   CORONARY ARTERY BYPASS GRAFT N/A 04/25/2023   Procedure: CORONARY ARTERY BYPASS GRAFTING (CABG) X 5 USING LEFT INTERNAL MAMMARY ARTERY AND ENDOSCOPICALLY HARVESTED RIGHT GREATER SAPHENOUS VEIN;  Surgeon: Kerrin Elspeth BROCKS, MD;  Location: [facility] OR;  Service: Open Heart Surgery;  Laterality: N/A;   RADIAL ARTERY HARVEST Left 04/25/2023   Procedure: RADIAL ARTERY HARVEST;  Surgeon: Kerrin Elspeth BROCKS, MD;  Location: [facility] OR;  Service: Open Heart Surgery;  Laterality: Left;   RIGHT/LEFT HEART CATH AND CORONARY ANGIOGRAPHY N/A 04/22/2023   Procedure: RIGHT/LEFT HEART CATH AND CORONARY ANGIOGRAPHY;  Surgeon: Mady Bruckner, MD;  Location: [facility] INVASIVE CV LAB;  Service: Cardiovascular;  Laterality: N/A;   TEE WITHOUT CARDIOVERSION N/A 04/25/2023   Procedure: TRANSESOPHAGEAL ECHOCARDIOGRAM;  Surgeon: Kerrin Elspeth BROCKS, MD;  Location: [facility] OR;  Service: Open Heart Surgery;  Laterality: N/A;    Past Family History   Family History  Problem Relation Age of Onset   Hypertension Mother     Past Social History   Social History   Socioeconomic History   Marital status: Single    Spouse name: Not on file   Number of children: 1   Years of education: Not on file   Highest education level: High school graduate  Occupational History   Occupation: [employer]    Comment: fired when admit6ted to hospital  Tobacco Use   Smoking status: Every Day    Current packs/day: 0.25    Average packs/day: 0.3 packs/day for 33.0 years (8.3 ttl pk-yrs)    Types: Cigarettes    Start date: 1993   Smokeless tobacco: Never   Vaping Use   Vaping status: Never Used  Substance and Sexual Activity   Alcohol use: Not Currently   Drug use: Not Currently    Types: Marijuana   Sexual activity: Not Currently  Other Topics Concern   Not on file  Social History Narrative   Not on file   Social Drivers of Health   Tobacco Use: High Risk (01/11/2025)   Patient History    Smoking Tobacco Use: Every Day    Smokeless Tobacco Use: Never    Passive Exposure: Not on file  Financial Resource Strain: Low Risk (04/29/2023)   Overall Financial Resource Strain (CARDIA)    Difficulty of Paying Living Expenses: Not hard at all  Food Insecurity: No Food Insecurity (04/19/2023)   Hunger Vital Sign    Worried About Running Out of Food in the Last  Year: Never true    Ran Out of Food in the Last Year: Never true  Transportation Needs: No Transportation Needs (04/29/2023)   PRAPARE - Administrator, Civil Service (Medical): No    Lack of Transportation (Non-Medical): No  Physical Activity: Not on file  Stress: Not on file  Social Connections: Not on file  Intimate Partner Violence: Not At Risk (04/19/2023)   Humiliation, Afraid, Rape, and Kick questionnaire    Fear of Current or Ex-Partner: No    Emotionally Abused: No    Physically Abused: No    Sexually Abused: No  Depression (PHQ2-9): Medium Risk (09/19/2023)   Depression (PHQ2-9)    PHQ-2 Score: 6  Alcohol Screen: Low Risk (04/29/2023)   Alcohol Screen    Last Alcohol Screening Score (AUDIT): 0  Housing: Low Risk (04/29/2023)   Housing    Last Housing Risk Score: 0  Recent Concern: Housing - Medium Risk (04/19/2023)   Housing    Last Housing Risk Score: 1  Utilities: Not At Risk (04/19/2023)   AHC Utilities    Threatened with loss of utilities: No  Health Literacy: Not on file    Review of Systems   General: Negative for anorexia, weight loss, fever, chills, fatigue, weakness. See hpi Eyes: Negative for vision changes.  ENT: Negative for hoarseness,  difficulty swallowing , nasal congestion. CV: Negative for chest pain, angina, palpitations, dyspnea on exertion, peripheral edema.  Respiratory: Negative for dyspnea at rest, dyspnea on exertion, cough, sputum, wheezing.  GI: See history of present illness. GU:  Negative for dysuria, hematuria, urinary incontinence, urinary frequency, nocturnal urination.  MS: Negative for joint pain, low back pain.  Derm: Negative for rash or itching.  Neuro: Negative for weakness, abnormal sensation, seizure, frequent headaches, memory loss,  confusion.  Psych: Negative for anxiety, depression, suicidal ideation, hallucinations.  Endo: Negative for unusual weight change.  Heme: Negative for bruising or bleeding. Allergy: Negative for rash or hives.  Physical Exam   BP 96/67   Pulse 67   Temp 97.9 F (36.6 C) (Oral)   Ht 5' 9 (1.753 m)   Wt 157 lb 9.6 oz (71.5 kg)   SpO2 98%   BMI 23.27 kg/m    General: Well-nourished, well-developed in no acute distress.  Head: Normocephalic, atraumatic.   Eyes: Conjunctiva pink, no icterus. Mouth: Oropharyngeal mucosa moist and pink  Neck: Supple without thyromegaly, masses, or lymphadenopathy.  Lungs: Clear to auscultation bilaterally.  Heart: Regular rate and rhythm, no murmurs rubs or gallops.  Abdomen: Bowel sounds are normal, nontender, nondistended, no hepatosplenomegaly or masses,  no abdominal bruits or hernia, no rebound or guarding.   Rectal: not performed Extremities: No lower extremity edema. No clubbing or deformities.  Neuro: Alert and oriented x 4 , grossly normal neurologically.  Skin: Warm and dry, no rash or jaundice.   Psych: Alert and cooperative, normal mood and affect.  Labs   See above  Imaging Studies   No results found.  Assessment/Plan:    Assessment & Plan Positive Cologuard: -aunt/uncle with colon cancer -chronic issues with bowels/hemorrhoids, no new changes. Fluctuates between [facility] 1-7, intermittent rectal  bleeding/pain with straining.  -colonoscopy. ASA 3.  I have discussed the risks, alternatives, benefits with regards to but not limited to the risk of reaction to medication, bleeding, infection, perforation and the patient is agreeable to proceed. Written consent to be obtained. -hold farxiga  72 hours before colonoscopy       Sonny RAMAN. Ezzard,  MHS, PA-C [facility]  "

## 2025-01-12 ENCOUNTER — Other Ambulatory Visit: Payer: Self-pay | Admitting: Nurse Practitioner

## 2025-01-21 ENCOUNTER — Other Ambulatory Visit: Payer: Self-pay

## 2025-01-21 ENCOUNTER — Encounter (HOSPITAL_COMMUNITY): Payer: Self-pay

## 2025-01-21 NOTE — Pre-Procedure Instructions (Signed)
 Attempted pre-op call. Left VM for him to call us  back.

## 2025-01-22 ENCOUNTER — Encounter (HOSPITAL_COMMUNITY): Admission: RE | Admit: 2025-01-22 | Source: Ambulatory Visit

## 2025-01-22 ENCOUNTER — Telehealth: Payer: Self-pay | Admitting: *Deleted

## 2025-01-22 HISTORY — DX: Acute myocardial infarction, unspecified: I21.9

## 2025-01-22 HISTORY — DX: Cerebral infarction, unspecified: I63.9

## 2025-01-22 NOTE — Telephone Encounter (Signed)
 Pt called in to reschedule procedure for 1/27 due to pending weather. He didn't want to risk it. Patient rescheduled to 2/10. Aware will send new instructions.

## 2025-02-09 ENCOUNTER — Ambulatory Visit (HOSPITAL_COMMUNITY): Admission: RE | Admit: 2025-02-09 | Source: Home / Self Care | Admitting: Internal Medicine

## 2025-02-09 ENCOUNTER — Encounter (HOSPITAL_COMMUNITY): Admission: RE | Payer: Self-pay | Source: Home / Self Care
